# Patient Record
Sex: Female | Born: 1965 | Race: White | Hispanic: No | Smoking: Current every day smoker
Health system: Southern US, Community
[De-identification: ages and names within clinical notes are randomized; demographics above are authoritative.]

## PROBLEM LIST (undated history)

## (undated) DIAGNOSIS — F1994 Other psychoactive substance use, unspecified with psychoactive substance-induced mood disorder: Secondary | ICD-10-CM

## (undated) DIAGNOSIS — F4481 Dissociative identity disorder: Secondary | ICD-10-CM

## (undated) DIAGNOSIS — R55 Syncope and collapse: Secondary | ICD-10-CM

## (undated) DIAGNOSIS — J449 Chronic obstructive pulmonary disease, unspecified: Secondary | ICD-10-CM

## (undated) DIAGNOSIS — I5189 Other ill-defined heart diseases: Secondary | ICD-10-CM

## (undated) DIAGNOSIS — F209 Schizophrenia, unspecified: Secondary | ICD-10-CM

## (undated) DIAGNOSIS — C519 Malignant neoplasm of vulva, unspecified: Secondary | ICD-10-CM

## (undated) DIAGNOSIS — I4510 Unspecified right bundle-branch block: Secondary | ICD-10-CM

## (undated) DIAGNOSIS — F319 Bipolar disorder, unspecified: Secondary | ICD-10-CM

## (undated) DIAGNOSIS — I10 Essential (primary) hypertension: Secondary | ICD-10-CM

## (undated) DIAGNOSIS — D234 Other benign neoplasm of skin of scalp and neck: Secondary | ICD-10-CM

## (undated) DIAGNOSIS — K219 Gastro-esophageal reflux disease without esophagitis: Secondary | ICD-10-CM

## (undated) DIAGNOSIS — Z789 Other specified health status: Secondary | ICD-10-CM

## (undated) DIAGNOSIS — F1910 Other psychoactive substance abuse, uncomplicated: Secondary | ICD-10-CM

## (undated) DIAGNOSIS — I700 Atherosclerosis of aorta: Secondary | ICD-10-CM

## (undated) DIAGNOSIS — K8020 Calculus of gallbladder without cholecystitis without obstruction: Secondary | ICD-10-CM

## (undated) DIAGNOSIS — F32A Depression, unspecified: Secondary | ICD-10-CM

## (undated) DIAGNOSIS — T451X5A Adverse effect of antineoplastic and immunosuppressive drugs, initial encounter: Secondary | ICD-10-CM

## (undated) DIAGNOSIS — N200 Calculus of kidney: Secondary | ICD-10-CM

## (undated) DIAGNOSIS — I739 Peripheral vascular disease, unspecified: Secondary | ICD-10-CM

## (undated) DIAGNOSIS — R112 Nausea with vomiting, unspecified: Secondary | ICD-10-CM

## (undated) DIAGNOSIS — R0789 Other chest pain: Secondary | ICD-10-CM

## (undated) DIAGNOSIS — F419 Anxiety disorder, unspecified: Secondary | ICD-10-CM

## (undated) DIAGNOSIS — I2510 Atherosclerotic heart disease of native coronary artery without angina pectoris: Secondary | ICD-10-CM

## (undated) DIAGNOSIS — E785 Hyperlipidemia, unspecified: Secondary | ICD-10-CM

## (undated) DIAGNOSIS — R011 Cardiac murmur, unspecified: Secondary | ICD-10-CM

## (undated) DIAGNOSIS — Z5900 Homelessness unspecified: Secondary | ICD-10-CM

## (undated) DIAGNOSIS — I219 Acute myocardial infarction, unspecified: Secondary | ICD-10-CM

## (undated) DIAGNOSIS — Z7969 Long term (current) use of other immunomodulators and immunosuppressants: Secondary | ICD-10-CM

## (undated) DIAGNOSIS — B182 Chronic viral hepatitis C: Secondary | ICD-10-CM

## (undated) DIAGNOSIS — Z79899 Other long term (current) drug therapy: Secondary | ICD-10-CM

## (undated) HISTORY — DX: Gastro-esophageal reflux disease without esophagitis: K21.9

## (undated) HISTORY — PX: SKIN GRAFT: SHX250

## (undated) HISTORY — PX: OTHER SURGICAL HISTORY: SHX169

## (undated) HISTORY — DX: Anxiety disorder, unspecified: F41.9

## (undated) HISTORY — DX: Essential (primary) hypertension: I10

## (undated) HISTORY — DX: Malignant neoplasm of vulva, unspecified: C51.9

## (undated) HISTORY — DX: Chronic obstructive pulmonary disease, unspecified: J44.9

## (undated) HISTORY — DX: Chronic viral hepatitis C: B18.2

## (undated) HISTORY — DX: Acute myocardial infarction, unspecified: I21.9

---

## 1997-09-18 ENCOUNTER — Encounter: Admission: RE | Admit: 1997-09-18 | Discharge: 1997-09-18 | Payer: Self-pay | Admitting: Family Medicine

## 1997-11-27 ENCOUNTER — Encounter: Admission: RE | Admit: 1997-11-27 | Discharge: 1997-11-27 | Payer: Self-pay | Admitting: Family Medicine

## 1997-12-25 ENCOUNTER — Encounter: Admission: RE | Admit: 1997-12-25 | Discharge: 1997-12-25 | Payer: Self-pay | Admitting: Family Medicine

## 1997-12-27 ENCOUNTER — Encounter: Admission: RE | Admit: 1997-12-27 | Discharge: 1997-12-27 | Payer: Self-pay | Admitting: Family Medicine

## 1998-01-09 ENCOUNTER — Encounter: Admission: RE | Admit: 1998-01-09 | Discharge: 1998-01-09 | Payer: Self-pay | Admitting: Family Medicine

## 1998-01-17 ENCOUNTER — Encounter: Admission: RE | Admit: 1998-01-17 | Discharge: 1998-01-17 | Payer: Self-pay | Admitting: Family Medicine

## 1998-01-22 ENCOUNTER — Ambulatory Visit (HOSPITAL_COMMUNITY): Admission: RE | Admit: 1998-01-22 | Discharge: 1998-01-22 | Payer: Self-pay | Admitting: Family Medicine

## 1998-01-28 ENCOUNTER — Encounter: Payer: Self-pay | Admitting: Family Medicine

## 1998-01-28 ENCOUNTER — Ambulatory Visit (HOSPITAL_COMMUNITY): Admission: RE | Admit: 1998-01-28 | Discharge: 1998-01-28 | Payer: Self-pay | Admitting: Family Medicine

## 1998-01-30 ENCOUNTER — Encounter: Admission: RE | Admit: 1998-01-30 | Discharge: 1998-01-30 | Payer: Self-pay | Admitting: Family Medicine

## 1998-06-03 ENCOUNTER — Encounter: Admission: RE | Admit: 1998-06-03 | Discharge: 1998-06-03 | Payer: Self-pay | Admitting: Family Medicine

## 1998-06-03 ENCOUNTER — Ambulatory Visit (HOSPITAL_COMMUNITY): Admission: RE | Admit: 1998-06-03 | Discharge: 1998-06-03 | Payer: Self-pay | Admitting: Family Medicine

## 1998-06-24 ENCOUNTER — Encounter: Admission: RE | Admit: 1998-06-24 | Discharge: 1998-06-24 | Payer: Self-pay | Admitting: Family Medicine

## 1998-09-05 ENCOUNTER — Encounter: Admission: RE | Admit: 1998-09-05 | Discharge: 1998-09-05 | Payer: Self-pay | Admitting: Family Medicine

## 1998-09-05 ENCOUNTER — Encounter: Payer: Self-pay | Admitting: Family Medicine

## 1998-09-05 ENCOUNTER — Ambulatory Visit (HOSPITAL_COMMUNITY): Admission: RE | Admit: 1998-09-05 | Discharge: 1998-09-05 | Payer: Self-pay | Admitting: Family Medicine

## 1998-11-25 ENCOUNTER — Encounter: Admission: RE | Admit: 1998-11-25 | Discharge: 1998-11-25 | Payer: Self-pay | Admitting: Family Medicine

## 1998-12-30 ENCOUNTER — Encounter: Admission: RE | Admit: 1998-12-30 | Discharge: 1998-12-30 | Payer: Self-pay | Admitting: Family Medicine

## 1999-01-09 ENCOUNTER — Encounter: Admission: RE | Admit: 1999-01-09 | Discharge: 1999-01-09 | Payer: Self-pay | Admitting: Family Medicine

## 1999-01-30 ENCOUNTER — Encounter: Admission: RE | Admit: 1999-01-30 | Discharge: 1999-01-30 | Payer: Self-pay | Admitting: Sports Medicine

## 1999-03-18 ENCOUNTER — Encounter: Admission: RE | Admit: 1999-03-18 | Discharge: 1999-03-18 | Payer: Self-pay | Admitting: Family Medicine

## 1999-04-08 ENCOUNTER — Encounter: Admission: RE | Admit: 1999-04-08 | Discharge: 1999-04-08 | Payer: Self-pay | Admitting: Family Medicine

## 1999-12-24 ENCOUNTER — Encounter: Admission: RE | Admit: 1999-12-24 | Discharge: 1999-12-24 | Payer: Self-pay | Admitting: Family Medicine

## 2000-02-16 ENCOUNTER — Encounter: Admission: RE | Admit: 2000-02-16 | Discharge: 2000-02-16 | Payer: Self-pay | Admitting: Sports Medicine

## 2000-11-03 ENCOUNTER — Encounter (INDEPENDENT_AMBULATORY_CARE_PROVIDER_SITE_OTHER): Payer: Self-pay | Admitting: *Deleted

## 2000-11-03 LAB — CONVERTED CEMR LAB

## 2000-12-02 ENCOUNTER — Encounter: Admission: RE | Admit: 2000-12-02 | Discharge: 2000-12-02 | Payer: Self-pay | Admitting: Family Medicine

## 2000-12-02 ENCOUNTER — Encounter: Admission: RE | Admit: 2000-12-02 | Discharge: 2000-12-02 | Payer: Self-pay | Admitting: Sports Medicine

## 2000-12-06 ENCOUNTER — Ambulatory Visit (HOSPITAL_COMMUNITY): Admission: RE | Admit: 2000-12-06 | Discharge: 2000-12-06 | Payer: Self-pay | Admitting: *Deleted

## 2000-12-09 ENCOUNTER — Encounter: Admission: RE | Admit: 2000-12-09 | Discharge: 2000-12-09 | Payer: Self-pay | Admitting: Family Medicine

## 2000-12-15 ENCOUNTER — Encounter: Admission: RE | Admit: 2000-12-15 | Discharge: 2000-12-15 | Payer: Self-pay | Admitting: Family Medicine

## 2001-01-24 ENCOUNTER — Encounter: Admission: RE | Admit: 2001-01-24 | Discharge: 2001-01-24 | Payer: Self-pay | Admitting: Family Medicine

## 2001-02-28 ENCOUNTER — Encounter: Payer: Self-pay | Admitting: *Deleted

## 2001-02-28 ENCOUNTER — Inpatient Hospital Stay (HOSPITAL_COMMUNITY): Admission: AD | Admit: 2001-02-28 | Discharge: 2001-02-28 | Payer: Self-pay | Admitting: *Deleted

## 2001-02-28 ENCOUNTER — Encounter: Admission: RE | Admit: 2001-02-28 | Discharge: 2001-02-28 | Payer: Self-pay | Admitting: Family Medicine

## 2001-03-01 ENCOUNTER — Inpatient Hospital Stay (HOSPITAL_COMMUNITY): Admission: AD | Admit: 2001-03-01 | Discharge: 2001-03-09 | Payer: Self-pay | Admitting: Obstetrics

## 2001-03-01 ENCOUNTER — Encounter (INDEPENDENT_AMBULATORY_CARE_PROVIDER_SITE_OTHER): Payer: Self-pay | Admitting: Specialist

## 2001-03-03 ENCOUNTER — Encounter: Payer: Self-pay | Admitting: Obstetrics & Gynecology

## 2001-03-20 ENCOUNTER — Encounter: Admission: RE | Admit: 2001-03-20 | Discharge: 2001-03-20 | Payer: Self-pay | Admitting: Family Medicine

## 2001-05-03 ENCOUNTER — Encounter: Admission: RE | Admit: 2001-05-03 | Discharge: 2001-05-03 | Payer: Self-pay | Admitting: Family Medicine

## 2001-10-05 ENCOUNTER — Encounter: Admission: RE | Admit: 2001-10-05 | Discharge: 2001-10-05 | Payer: Self-pay | Admitting: Family Medicine

## 2002-06-04 ENCOUNTER — Encounter: Admission: RE | Admit: 2002-06-04 | Discharge: 2002-06-04 | Payer: Self-pay | Admitting: Family Medicine

## 2003-03-14 ENCOUNTER — Encounter: Admission: RE | Admit: 2003-03-14 | Discharge: 2003-03-14 | Payer: Self-pay | Admitting: Family Medicine

## 2003-11-01 ENCOUNTER — Encounter: Admission: RE | Admit: 2003-11-01 | Discharge: 2003-11-01 | Payer: Self-pay | Admitting: Family Medicine

## 2003-11-19 ENCOUNTER — Encounter: Admission: RE | Admit: 2003-11-19 | Discharge: 2003-11-19 | Payer: Self-pay | Admitting: Family Medicine

## 2004-08-05 ENCOUNTER — Ambulatory Visit: Payer: Self-pay | Admitting: Family Medicine

## 2005-01-16 ENCOUNTER — Emergency Department: Payer: Self-pay | Admitting: General Practice

## 2005-06-29 ENCOUNTER — Other Ambulatory Visit: Payer: Self-pay

## 2005-06-29 ENCOUNTER — Emergency Department: Payer: Self-pay | Admitting: Emergency Medicine

## 2005-07-19 ENCOUNTER — Ambulatory Visit: Payer: Self-pay | Admitting: Family Medicine

## 2005-07-27 ENCOUNTER — Emergency Department: Payer: Self-pay | Admitting: Emergency Medicine

## 2006-06-02 DIAGNOSIS — J4520 Mild intermittent asthma, uncomplicated: Secondary | ICD-10-CM | POA: Insufficient documentation

## 2006-06-02 DIAGNOSIS — F603 Borderline personality disorder: Secondary | ICD-10-CM | POA: Insufficient documentation

## 2006-06-02 DIAGNOSIS — K219 Gastro-esophageal reflux disease without esophagitis: Secondary | ICD-10-CM | POA: Insufficient documentation

## 2006-06-02 DIAGNOSIS — F17200 Nicotine dependence, unspecified, uncomplicated: Secondary | ICD-10-CM | POA: Insufficient documentation

## 2006-06-02 DIAGNOSIS — I10 Essential (primary) hypertension: Secondary | ICD-10-CM | POA: Insufficient documentation

## 2006-06-03 ENCOUNTER — Encounter (INDEPENDENT_AMBULATORY_CARE_PROVIDER_SITE_OTHER): Payer: Self-pay | Admitting: *Deleted

## 2006-11-30 ENCOUNTER — Ambulatory Visit: Payer: Self-pay | Admitting: Family Medicine

## 2006-11-30 ENCOUNTER — Ambulatory Visit: Payer: Self-pay | Admitting: Internal Medicine

## 2006-11-30 ENCOUNTER — Observation Stay (HOSPITAL_COMMUNITY): Admission: EM | Admit: 2006-11-30 | Discharge: 2006-12-01 | Payer: Self-pay | Admitting: Emergency Medicine

## 2006-11-30 ENCOUNTER — Encounter: Payer: Self-pay | Admitting: Family Medicine

## 2006-11-30 DIAGNOSIS — K279 Peptic ulcer, site unspecified, unspecified as acute or chronic, without hemorrhage or perforation: Secondary | ICD-10-CM | POA: Insufficient documentation

## 2006-11-30 DIAGNOSIS — I2510 Atherosclerotic heart disease of native coronary artery without angina pectoris: Secondary | ICD-10-CM | POA: Insufficient documentation

## 2006-11-30 DIAGNOSIS — I10 Essential (primary) hypertension: Secondary | ICD-10-CM | POA: Insufficient documentation

## 2006-11-30 DIAGNOSIS — I25118 Atherosclerotic heart disease of native coronary artery with other forms of angina pectoris: Secondary | ICD-10-CM | POA: Insufficient documentation

## 2006-11-30 DIAGNOSIS — B1710 Acute hepatitis C without hepatic coma: Secondary | ICD-10-CM

## 2006-11-30 DIAGNOSIS — F1910 Other psychoactive substance abuse, uncomplicated: Secondary | ICD-10-CM

## 2006-11-30 DIAGNOSIS — R079 Chest pain, unspecified: Secondary | ICD-10-CM

## 2006-12-01 ENCOUNTER — Encounter (INDEPENDENT_AMBULATORY_CARE_PROVIDER_SITE_OTHER): Payer: Self-pay | Admitting: Family Medicine

## 2006-12-09 ENCOUNTER — Ambulatory Visit: Payer: Self-pay | Admitting: Internal Medicine

## 2006-12-09 HISTORY — PX: LEFT HEART CATH AND CORONARY ANGIOGRAPHY: CATH118249

## 2006-12-21 ENCOUNTER — Encounter (INDEPENDENT_AMBULATORY_CARE_PROVIDER_SITE_OTHER): Payer: Self-pay | Admitting: Family Medicine

## 2007-03-20 ENCOUNTER — Inpatient Hospital Stay: Payer: Self-pay | Admitting: Unknown Physician Specialty

## 2007-03-21 ENCOUNTER — Encounter: Payer: Self-pay | Admitting: *Deleted

## 2007-10-24 ENCOUNTER — Emergency Department: Payer: Self-pay | Admitting: Emergency Medicine

## 2007-12-25 ENCOUNTER — Telehealth: Payer: Self-pay | Admitting: *Deleted

## 2008-11-25 ENCOUNTER — Emergency Department: Payer: Self-pay | Admitting: Unknown Physician Specialty

## 2009-08-21 ENCOUNTER — Inpatient Hospital Stay: Payer: Self-pay | Admitting: Internal Medicine

## 2010-01-14 ENCOUNTER — Encounter: Payer: Self-pay | Admitting: Family Medicine

## 2010-03-26 ENCOUNTER — Encounter: Payer: Self-pay | Admitting: Family Medicine

## 2010-05-07 NOTE — Miscellaneous (Signed)
  Clinical Lists Changes  Problems: Removed problem of PEPTIC ULCER DIS., UNSPEC. W/O OBSTRUCTION (ICD-533.90) Removed problem of GERD (ICD-530.81) Removed problem of ASTHMA, UNSPECIFIED (ICD-493.90)

## 2010-05-07 NOTE — Miscellaneous (Signed)
  Clinical Lists Changes  Problems: Removed problem of ASTHMA (ICD-493.90)

## 2010-07-31 ENCOUNTER — Emergency Department: Payer: Self-pay | Admitting: Internal Medicine

## 2010-08-21 NOTE — Discharge Summary (Signed)
[facility]  Patient:    Carol Henderson, Carol Henderson Visit Number: [id number] MRN: [id number]          Service Type: OBS Location: 910A 9139 01 Attending Physician:  Carol Henderson Dictated by:   Carol Henderson, M.D. Admit Date:  03/01/2001 Discharge Date: 03/09/2001   CC:         Carol Henderson, M.D.             Carol Henderson, M.D.                           Discharge Summary  DISCHARGE DIAGNOSES:          1. Prolonged premature rupture of membranes.                               2. Repeat low transverse cesarean section.                               3. Bilateral tubal ligation.                               4. Anemia.                               5. Polysubstance abuse.                               6. Psychiatric disorder, not otherwise                                  specified.  PROCEDURES:                   1. Repeat low transverse cesarean section on                                  March 06, 2001, by Dr. Corky Henderson.                               2. Bilateral tubal ligation on March 06, 2001,                                  also by Dr. Gavin Henderson.                               3. Limited obstetric ultrasound on                                  March 03, 2001, which showed single living                                  intrauterine gestation in cephalic  presentation, subjectively decreased amniotic                                  fluid with an amniotic fluid index of 7.2 cm,                                  shortened cervix measuring 2.0 cm.  LABORATORY DATA:              CBC on March 08, 2001, showed a white blood cell count of 13.2, hemoglobin 8.7, hematocrit of 24.6, platelet count of 378. CMET on March 08, 2001, showed a sodium of 137, potassium 4.2, chloride of 110, CO2 of 25, glucose of 88, BUN of 8, creatinine of 0.6, bilirubin of 0.2, alkaline phosphatase of 99, SGOT 13, SGPT of  15, total protein of 5.7, albumin of 2.3, calcium of 8.3, uric acid of 3.3, LDH of 158.  Urine drug screen on March 01, 2001, showed positive benzodiazepines and cocaine and THC.  RPR was nonreactive.  L to S ratio was pending at discharge.  HOSPITAL COURSE:              Carol Henderson is a 45 year old G2, now P1-1-0-2, who delivered a little baby girl with Apgars of 9 and 9 at one and five minutes by repeat low transverse cesarean section on March 06, 2001, as above.  The patient also underwent bilateral tubal ligation.  The patient tolerated the procedure well without complications.  The patient was watched postoperatively for three days without complications.  She has been afebrile with normal and stable vital signs.  She had [facility] laboratories that were normal.  On the third day postoperatively, the patient was doing well, was ambulating in the halls, tolerating p.o. well, and was passing flatus.  The patients incision looked clean, dry, and intact.  Staples were removed and replaced with Steri-Strips.  The patient was then discharged to home with baby to follow up in the [facility] in six weeks.  The baby is to follow up at the same place in two weeks.  ACTIVITY INSTRUCTIONS:        No heavy lifting for one month.  DIET:                         No restrictions.  MEDICATIONS:                  1. Ferrous sulfate 325 mg p.o. t.i.d.                               2. Prenatal vitamins 1 tablet q.d.                               3. Percocet 5/325 one to two tablets p.o.                                  q.6h. p.r.n. for pain.  SPECIAL INSTRUCTIONS:         The patient was told to watch her incision for warmth, bleeding, discharge, or increased tenderness.  She is to follow up with Dr. Efraim Kaufmann  Susann Henderson at the [facility] in six weeks.  She is to call [phone] for an appointment. Dictated by:   Carol Henderson, M.D. Attending Physician:  Carol Henderson DD:  03/09/01 TD:  03/09/01 Job: [id number]

## 2010-08-21 NOTE — Discharge Summary (Signed)
Carol Henderson, Carol Henderson                ACCOUNT NO.:  [id number]   MEDICAL RECORD NO.:  [id number]          PATIENT TYPE:  OUT   LOCATION:  EKG                          FACILITY:  [facility]   PHYSICIAN:  Leighton Roach McDiarmid, M.D.DATE OF BIRTH:  February 21, 1966   DATE OF ADMISSION:  11/30/2006  DATE OF DISCHARGE:  12/01/2006                               DISCHARGE SUMMARY   PRIMARY CARE PHYSICIAN:  Levander Campion, M.D.   CONSULTATIONSEduardo Osier. Sharyn Lull, M.D., cardiology.   REASON FOR ADMISSION:  Chest pain.   LABORATORY DATA:  On admission white blood cells 8.0, hemoglobin 15.5,  platelets 351, creatinine 0.91, liver enzymes were completely normal.  A  UDS was positive for cocaine and THC.  Admission studies and EKG showed  nonspecific T-wave changes.   DISCHARGE DIAGNOSES:  1. Chest pain.  2. Hypertension.  3. Chronic obstructive pulmonary disease.  4. Gastroesophageal reflux disease.   SECONDARY DISCHARGE DIAGNOSES:  1. Hepatitis C, stable.  2. Substance abuse.  3. Tobacco abuse.   DISCHARGE MEDICATIONS:  New:  1. Norvasc 10 mg daily.  2. Omeprazole 40 mg daily.  3. Spiriva 1 puff daily.  4. Nitro 0.4 mg sublingually every 5 minutes, max 3 doses in 15      minutes.   HOSPITAL COURSE:  Miss Yonts is a 45 year old who presented to the  [facility] for a regular appointment, however,  described history of chest pain to her physician.  The story, timing and  onset was suspicious and worrisome, therefore the patient felt it wise  to admit the patient for a chest pain workup.  1. Regarding her chest pain, she did have a couple episodes overnight,      however, her cardiac enzymes remained negative and her EKG remained      unchanged, causes could be cardiac, i.e., unstable angina, GERD or      chest pain could be secondary to cocaine.  Cardiology was consulted      as the patient's pain was atypical and she was relatively young and      had negative enzymes and an  unchanged EKG.  Dr. Sharyn Lull she be      scheduled in his office as an outpatient for further workup.  She      was discharged home with a nitro prescription and instructions of      how to use it and when and why to return to the emergency room, as      well as advisement to stop cocaine use.  2. Regarding her hypertension, the patient's pressures were      significantly elevated especially her diastolics.  We did begin      Norvasc as a beta blocker is contraindicated with cocaine use and      the patient did not a diuretic.  We did recommend that perhaps      adding lisinopril would be wise in this patient if she shows to be      compliant with follow-up appointments, compliance in the past has      not been  remarkable, therefore we did not start lisinopril at      discharge as we were not sure if patient would followup with lab      work to check her creatinine and her potassium.  If the patient      schedules an appointment with Dr. Luz Brazen a instructed and shows      good compliance, recommend started lisinopril for her blood      pressure.  3. Regarding her COPD, she was restarted on Spiriva by her primary      care physician before coming to the hospital.  We continued this at      discharge and recommend close followup with her primary care      physician.   Pertinent discharge labs:  White blood cells 8.1, hemoglobin 16.2,  platelets 326, creatinine 0.69, glucose 81, total cholesterol was 178,  triglycerides 89, HDL 44, and LDL 116.  Cardiac enzymes were negative  times 3.   CONDITION ON DISCHARGE:  Stable.   DISPOSITION:  Home.   FOLLOW-UP APPOINTMENT:  The patient was instructed to schedule a follow-  up appointment with Dr. Luz Brazen in 1 week and an appointment was made  for the patient at Dr. Annitta Jersey office for Tuesday, December 06, 2006,  at 1:45 p.m.   Follow-up issues primarily for her primary care physician would be:  1. Her blood pressure as described above.   2. The result of her cardiology appointment.  3. Any further chest pain.      Ardeen Garland, MD  Electronically Signed      Leighton Roach McDiarmid, M.D.  Electronically Signed    LM/MEDQ  D:  12/03/2006  T:  12/04/2006  Job:  [id number]   cc:   Eduardo Osier. Sharyn Lull, M.D.  Levander Campion, M.D.

## 2010-08-21 NOTE — Op Note (Signed)
[facility]  Patient:    Carol Henderson, Carol Henderson Visit Number: [id number] MRN: [id number]          Service Type: OBS Location: 910A 9139 01 Attending Physician:  Tammi Sou Dictated by:   Conni Elliot, M.D. Proc. Date: 03/06/01 Admit Date:  03/01/2001                             Operative Report  PREOPERATIVE DIAGNOSIS:       Preterm prolonged rupture of membranes in labor,                               prior cesarean delivery, request repeat, and                               desires surgical sterilization.  POSTOPERATIVE DIAGNOSIS:      Preterm prolonged rupture of membranes in labor,                               prior cesarean delivery, request repeat, and                               desires surgical sterilization.  OPERATION:                    Low transverse cesarean and modified bilateral                               [place] tubal ligation.  SURGEON:                      Conni Elliot, M.D.  ASSISTANT:                    __________.  ANESTHESIA:                   Spinal.  OPERATIVE FINDINGS:           Female with Apgars of 9 and 9.  Cord pH was sent.  Placenta to pathology and evidence of a _______ cord noted at the cesarean.  DESCRIPTION OF PROCEDURE:     The patient received spinal.  The patient was supine with lateral tilt position.  Receiving oxygen.  The abdomen was prepped and draped in the usual sterile fashion.  A low transverse Pfannenstiel incision was made in the skin and the rectus fascia.  The rectus muscles were separated in the midline, ______ and bladder flap created.  A low transverse uterine incision was made.  The baby delivered in the vertex position.  The cord doubly clamped and cut.  The baby handed to the neonatologist in attendance.  The placenta was removed manually.  The uterus was closed in a routine fashion.  However, the bladder flap was so scarred it was not reapproximated.  The  anterior peritoneum was also not reapproximated due to omental adhesions.  The rectus muscles were brought together in the midline. The fascia and skin were closed in the routine fashion.  Estimated blood loss was less than 800 cc.  I need to note that upon finishing the uterine closure, at  the patients request, the right fallopian was identified, grasped with Babcock clamps, and followed to its fimbriated end.  A segment of the tube was brought into the operative field and double suture ligated and a 1.5 cm to 2 cm segment was excised.  Hemostasis was adequate.  This was repeated on the left tube and hemostasis again adequate. Dictated by:   Conni Elliot, M.D. Attending Physician:  Tammi Sou DD:  03/06/01 TD:  03/07/01 Job: [id number]

## 2011-01-15 LAB — DIFFERENTIAL
Basophils Absolute: 0
Eosinophils Absolute: 0.2
Eosinophils Relative: 2
Lymphocytes Relative: 35
Monocytes Absolute: 0.6

## 2011-01-15 LAB — CBC
HCT: 45.4
Hemoglobin: 15.5 — ABNORMAL HIGH
Hemoglobin: 16.2 — ABNORMAL HIGH
MCHC: 34.6
MCV: 91.2
Platelets: 326
Platelets: 351
RBC: 4.98
RDW: 13.4
WBC: 8

## 2011-01-15 LAB — LIPID PANEL
Cholesterol: 178
LDL Cholesterol: 116 — ABNORMAL HIGH
Triglycerides: 89
VLDL: 18

## 2011-01-15 LAB — CARDIAC PANEL(CRET KIN+CKTOT+MB+TROPI)
CK, MB: 1.6
CK, MB: 4.1 — ABNORMAL HIGH
Relative Index: INVALID
Relative Index: INVALID
Total CK: 37
Total CK: 51
Troponin I: 0.03

## 2011-01-15 LAB — COMPREHENSIVE METABOLIC PANEL
ALT: 18
AST: 21
Albumin: 3.3 — ABNORMAL LOW
CO2: 25
Chloride: 107
GFR calc Af Amer: >60
GFR calc non Af Amer: >60
Potassium: 3.9
Sodium: 138
Total Bilirubin: 0.6

## 2011-01-15 LAB — BASIC METABOLIC PANEL
Calcium: 9.5
GFR calc Af Amer: >60
GFR calc non Af Amer: >60
Potassium: 3.4 — ABNORMAL LOW
Sodium: 135

## 2011-01-15 LAB — RAPID URINE DRUG SCREEN, HOSP PERFORMED
Amphetamines: NOT DETECTED
Barbiturates: NOT DETECTED
Opiates: NOT DETECTED

## 2011-01-15 LAB — TROPONIN I: Troponin I: 0.03

## 2011-01-15 LAB — CK TOTAL AND CKMB (NOT AT ARMC): CK, MB: 1.6

## 2011-07-01 ENCOUNTER — Ambulatory Visit: Payer: Self-pay | Admitting: General Practice

## 2013-01-26 DIAGNOSIS — I214 Non-ST elevation (NSTEMI) myocardial infarction: Secondary | ICD-10-CM

## 2013-01-26 HISTORY — DX: Non-ST elevation (NSTEMI) myocardial infarction: I21.4

## 2013-01-26 LAB — COMPREHENSIVE METABOLIC PANEL
Albumin: 3.5 g/dL (ref 3.4–5.0)
Alkaline Phosphatase: 112 U/L (ref 50–136)
Anion Gap: 10 (ref 7–16)
BUN: 11 mg/dL (ref 7–18)
Calcium, Total: 9.4 mg/dL (ref 8.5–10.1)
Co2: 26 mmol/L (ref 21–32)
EGFR (Non-African Amer.): >60
Glucose: 109 mg/dL — ABNORMAL HIGH (ref 65–99)
Osmolality: 266 (ref 275–301)
Potassium: 3.9 mmol/L (ref 3.5–5.1)
SGOT(AST): 52 U/L — ABNORMAL HIGH (ref 15–37)
SGPT (ALT): 33 U/L (ref 12–78)

## 2013-01-26 LAB — CBC
HGB: 18.7 g/dL — ABNORMAL HIGH (ref 12.0–16.0)
MCHC: 34.5 g/dL (ref 32.0–36.0)
WBC: 17.3 10*3/uL — ABNORMAL HIGH (ref 3.6–11.0)

## 2013-01-26 LAB — TSH: Thyroid Stimulating Horm: 2.6 u[IU]/mL

## 2013-01-27 ENCOUNTER — Inpatient Hospital Stay: Payer: Self-pay | Admitting: Internal Medicine

## 2013-01-27 LAB — TROPONIN I
Troponin-I: 0.6 ng/mL — ABNORMAL HIGH
Troponin-I: 0.23 ng/mL — ABNORMAL HIGH

## 2013-01-27 LAB — APTT: Activated PTT: 95.3 secs — ABNORMAL HIGH (ref 23.6–35.9)

## 2013-01-27 LAB — CK TOTAL AND CKMB (NOT AT ARMC): CK, Total: 114 U/L (ref 21–215)

## 2013-01-28 LAB — LIPID PANEL
Cholesterol: 162 mg/dL (ref 0–200)
HDL Cholesterol: 35 mg/dL — ABNORMAL LOW (ref 40–60)
Triglycerides: 130 mg/dL (ref 0–200)

## 2013-01-28 LAB — HEMOGLOBIN: HGB: 15.8 g/dL (ref 12.0–16.0)

## 2013-01-28 LAB — URINALYSIS, COMPLETE
Blood: NEGATIVE
Glucose,UR: NEGATIVE mg/dL (ref 0–75)
Leukocyte Esterase: NEGATIVE
Ph: 8 (ref 4.5–8.0)
Squamous Epithelial: 1
WBC UR: 5 /HPF (ref 0–5)

## 2013-01-28 LAB — APTT: Activated PTT: 61.2 secs — ABNORMAL HIGH (ref 23.6–35.9)

## 2013-01-28 LAB — PLATELET COUNT: Platelet: 254 10*3/uL (ref 150–440)

## 2013-01-29 HISTORY — PX: LEFT HEART CATH AND CORONARY ANGIOGRAPHY: CATH118249

## 2013-05-10 ENCOUNTER — Ambulatory Visit: Payer: Self-pay | Admitting: Gynecologic Oncology

## 2013-05-15 ENCOUNTER — Ambulatory Visit: Payer: Self-pay | Admitting: Gynecologic Oncology

## 2013-05-27 DIAGNOSIS — Z8544 Personal history of malignant neoplasm of other female genital organs: Secondary | ICD-10-CM | POA: Insufficient documentation

## 2013-05-27 DIAGNOSIS — C519 Malignant neoplasm of vulva, unspecified: Secondary | ICD-10-CM | POA: Insufficient documentation

## 2013-06-03 ENCOUNTER — Ambulatory Visit: Payer: Self-pay | Admitting: Gynecologic Oncology

## 2013-06-19 DIAGNOSIS — T451X5A Adverse effect of antineoplastic and immunosuppressive drugs, initial encounter: Secondary | ICD-10-CM

## 2013-06-19 DIAGNOSIS — R112 Nausea with vomiting, unspecified: Secondary | ICD-10-CM | POA: Insufficient documentation

## 2013-07-03 DIAGNOSIS — G893 Neoplasm related pain (acute) (chronic): Secondary | ICD-10-CM | POA: Insufficient documentation

## 2013-07-09 DIAGNOSIS — E876 Hypokalemia: Secondary | ICD-10-CM | POA: Insufficient documentation

## 2013-08-18 DIAGNOSIS — R55 Syncope and collapse: Secondary | ICD-10-CM | POA: Insufficient documentation

## 2013-08-18 DIAGNOSIS — R031 Nonspecific low blood-pressure reading: Secondary | ICD-10-CM | POA: Insufficient documentation

## 2013-08-18 DIAGNOSIS — I959 Hypotension, unspecified: Secondary | ICD-10-CM | POA: Insufficient documentation

## 2014-04-05 DIAGNOSIS — I219 Acute myocardial infarction, unspecified: Secondary | ICD-10-CM

## 2014-04-05 DIAGNOSIS — B182 Chronic viral hepatitis C: Secondary | ICD-10-CM

## 2014-04-05 HISTORY — DX: Acute myocardial infarction, unspecified: I21.9

## 2014-04-05 HISTORY — DX: Chronic viral hepatitis C: B18.2

## 2014-04-17 ENCOUNTER — Ambulatory Visit: Payer: Self-pay | Admitting: Urgent Care

## 2014-05-01 ENCOUNTER — Ambulatory Visit: Payer: Self-pay | Admitting: Urgent Care

## 2014-05-01 LAB — FERRITIN: Ferritin (ARMC): 59 ng/mL (ref 8–388)

## 2014-05-01 LAB — CBC WITH DIFFERENTIAL/PLATELET
BASOS ABS: 0.1 10*3/uL (ref 0.0–0.1)
Basophil %: 0.9 %
Eosinophil #: 0.1 10*3/uL (ref 0.0–0.7)
Eosinophil %: 0.8 %
HCT: 44.4 % (ref 35.0–47.0)
HGB: 14.8 g/dL (ref 12.0–16.0)
LYMPHS ABS: 1.4 10*3/uL (ref 1.0–3.6)
Lymphocyte %: 20.7 %
MCH: 31.2 pg (ref 26.0–34.0)
MCHC: 33.2 g/dL (ref 32.0–36.0)
MCV: 94 fL (ref 80–100)
MONO ABS: 0.6 x10 3/mm (ref 0.2–0.9)
MONOS PCT: 8.3 %
Neutrophil #: 4.7 10*3/uL (ref 1.4–6.5)
Neutrophil %: 69.3 %
PLATELETS: 344 10*3/uL (ref 150–440)
RBC: 4.73 10*6/uL (ref 3.80–5.20)
RDW: 13.4 % (ref 11.5–14.5)
WBC: 6.8 10*3/uL (ref 3.6–11.0)

## 2014-05-01 LAB — COMPREHENSIVE METABOLIC PANEL
ALBUMIN: 3.6 g/dL (ref 3.4–5.0)
ALK PHOS: 103 U/L (ref 46–116)
ALT: 31 U/L (ref 14–63)
Anion Gap: 8 (ref 7–16)
BILIRUBIN TOTAL: 0.3 mg/dL (ref 0.2–1.0)
BUN: 19 mg/dL — AB (ref 7–18)
CREATININE: 0.96 mg/dL (ref 0.60–1.30)
Calcium, Total: 9.6 mg/dL (ref 8.5–10.1)
Chloride: 106 mmol/L (ref 98–107)
Co2: 24 mmol/L (ref 21–32)
EGFR (African American): >60
GLUCOSE: 74 mg/dL (ref 65–99)
OSMOLALITY: 277 (ref 275–301)
Potassium: 3.9 mmol/L (ref 3.5–5.1)
SGOT(AST): 28 U/L (ref 15–37)
SODIUM: 138 mmol/L (ref 136–145)
TOTAL PROTEIN: 7.9 g/dL (ref 6.4–8.2)

## 2014-05-01 LAB — IRON: IRON: 57 ug/dL (ref 50–170)

## 2014-06-20 DIAGNOSIS — F1120 Opioid dependence, uncomplicated: Secondary | ICD-10-CM | POA: Insufficient documentation

## 2014-07-26 NOTE — H&P (Signed)
PATIENT NAME:  Carol Henderson, Carol Henderson MR#:  [id number] DATE OF BIRTH:  [birth year]  DATE OF ADMISSION:  01/27/2013  REFERRING PHYSICIAN: Dr. Corky Downs.   PRIMARY CARE PHYSICIAN: Dr. Randel Books.   CHIEF COMPLAINT: Chest pain.   HISTORY OF PRESENT ILLNESS: This is a 49 year old Caucasian female with past medical history of hypertension, COPD, hepatitis C who has presented with chest pain. Her symptoms originally began 2:30 a.m. 01/26/2013 which awoke her from sleep. She described this as a retrosternal chest pain with a pressure and quality 9 out of 10 in intensity, nonradiating. No worsening or relieving factors. It lasted a few hours. She had associated shortness of breath, nausea, emesis was nonbloody, nonbilious x 1, as well as diaphoresis. She then had a repeat episode later in the day, but is uncertain about the timing after that episode. Per the insistence of her mother, she presented to [facility] for workup and evaluation. She was given aspirin and nitroglycerin with mild improvement in symptoms.  Her initial EKG had concerning ST changes. The ED physician discussed the case with [facility], concerning this may require transfer to a higher care facility, however, on repeat EKG, ST changes improved. Case was discussed and deemed to be an N-STEMI and patient treated appropriately here.  Prior to this she has not had any anginal, exertional prior or primary chest pain.   REVIEW OF SYSTEMS: CONSTITUTIONAL: Denies fevers, chills, fatigue.  EYES: Denies blurred vision, double vision, eye pain.  ENT: Denies ear pain, hearing loss or discharge.  RESPIRATORY: Denies cough, wheeze. Positive for shortness of breath as above.  CARDIOVASCULAR: Chest pain as above. Denies orthopnea, edema, palpitations.  GASTROINTESTINAL: Nausea, vomiting as above. Denies diarrhea, abdominal pain, hematemesis.  GENITOURINARY: Denies hematuria, dysuria.  ENDOCRINE: Denies nocturia or polyuria.  HEMATOLOGIC AND LYMPHATIC:  Denies easy bruising or bleeding.  SKIN: Denies any rash or lesions.   MUSCULOSKELETAL: Denies pain in her neck, back, shoulder, knees, hip, any arthritic symptoms.  NEUROLOGIC: Denies paralysis, paresthesias.  PSYCHIATRIC: Denies any anxiety or depressive symptoms.   Otherwise, full review of systems performed by me is negative.   PAST MEDICAL HISTORY: Hypertension, hepatitis C,  COPD.   FAMILY HISTORY: Positive for lung cancer, as well as coronary artery disease in her father at late onset, breast cancer in her mother.   SOCIAL HISTORY: Positive for tobacco abuse. Currently half pack daily smoker. Denies any alcohol or drug usage.   ALLERGIES: No known drug allergies.   HOME MEDICATIONS: Albuterol 90 mcg inhalation 2 puffs q.4 hours as needed for shortness of breath, amitriptyline 50 mg p.o. at bedtime, benztropine 1 mg p.o. at bedtime, Catapres 0.2 mg p.o. b.i.d., hydrochlorothiazide 25 mg daily, prazosin 2 mg p.o. at bedtime, trazodone 400 mg p.o. at bedtime.   PHYSICAL EXAMINATION: VITAL SIGNS: Temperature 98.2, heart rate 113, respirations 20, blood pressure 106/74, saturating 94% on room air. Weight 93.4 kg, BMI 35.4.  GENERAL: Well-nourished, well-developed, Caucasian female who is currently in no acute distress.  HEAD: Normocephalic, atraumatic.  EYES: Pupils equal, round, react to light as well as accommodation. Extraocular muscles intact. No scleral icterus.  MOUTH: Moist mucous membranes, absent dentition. No abscesses noted.  EARS, NOSE, THROAT: Throat clear without exudate. No external lesions.  NECK: Supple. No thyromegaly. No nodules. No JVD.  PULMONARY: Clear bilaterally, no history of rales or wheezes, rhonchi. No use of accessory muscles. Good respiratory effort.  CHEST: Mild tender to palpation over her sternum, though symptoms were severe than prior in  the day.   CARDIOVASCULAR: S1, S2, regular rate and rhythm. No murmurs, rubs or gallops. No edema. Pedal pulse 2+.   GASTROINTESTINAL: Soft, nontender, nondistended. No masses. No hepatosplenomegaly. Positive bowel sounds.  MUSCULOSKELETAL: No swelling, clubbing or edema. Range of motion full in all extremities.  NEUROLOGIC: Cranial nerves II through XII intact. No gross motor deficits. Sensation intact. Reflexes intact.  SKIN: No ulcerations, lesions, rashes or cyanosis. She does have multiple tattoos. Skin is warm and dry. Turgor is intact. PSYCHIATRIC: Mood and affect within normal limits. She is awake, oriented x 3. Insight and judgment intact.   LABORATORY DATA: Sodium 133, potassium 3.9, chloride 97, bicarb 26, BUN 11, creatinine 1.07, glucose 109, troponin I 0.81. TSH 2.6. WBC 17.3, hemoglobin 18.7, platelets of 346. EKG performed at 2136 reveals repolarization abnormality, V3, V4, V5 sinus tachycardia, heart rate of 130. Repeat EKG has remained sinus tachycardic, heart rate now 119 with T inversions V3 to V6.   ASSESSMENT AND PLAN: A 49 year old female with history of hypertension, chronic obstructive pulmonary disease, presenting with chest pain, found to have EKG changes and positive enzymes one and STEMI score of 2. 1. Trend cardiac enzymes. already given aspirin and nitroglycerin and heparin drip started. Consult cardiology. Start beta blocker and statin therapy.  2. Hypertension. Start beta blocker, hold Catapres and Prazosin for more traditional blood pressure agents. 3. Chronic obstructive pulmonary disease, no active symptoms or wheeze. She is on p.r.n. medications. 4.   Deep vein thrombosis prophylaxis. She will be on a heparin drip.  5.  The patient is full code.   TIME SPENT: 45 minutes.     ____________________________ Aaron Mose. Caedan Sumler, MD dkh:NTS D: 01/27/2013 01:05:09 ET T: 01/27/2013 02:06:34 ET JOB#: [id number]  cc: Aaron Mose. Caryn Gienger, MD, <Dictator> Santiago Stenzel Woodfin Ganja MD ELECTRONICALLY SIGNED 01/27/2013 4:04

## 2014-07-26 NOTE — Discharge Summary (Signed)
PATIENT NAME:  Carol Henderson, Carol Henderson MR#:  [id number] DATE OF BIRTH:  November 07, 1965  DATE OF ADMISSION:  01/27/2013 DATE OF DISCHARGE:  01/29/2013  ADMITTING PHYSICIAN:  Dr.  Valentino Nose   DISCHARGING PHYSICIAN: Dr. Gladstone Lighter   PRIMARY CARE PHYSICIAN:  Dr. Randel Books.   [place]: Cardiology consultation with Dr. Nehemiah Massed.   DISCHARGE DIAGNOSES: 1. Non-ST segment elevation myocardial infarction in the cardiac catheterization revealing multi small disease not amenable for any PCI and no critical stenosis noted.  2.  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  3.  Chronic systolic congestive heart failure, well compensated.  4.  Tobacco use disorder.  5.  Gastroesophageal reflux disease.  6.  Chronic obstructive pulmonary disease.  7.  Persistent chest pain, likely noncardiac and appears to be comfortable.   DISCHARGE MEDICATIONS: 1.  Albuterol inhaler, 2 puffs every 4 hours p.r.n.  2.  Trazodone 400 mg p.o. at bedtime.  3.  Benztropine 1 mg p.o. at bedtime.  4.  Lisinopril 2.5 mg p.o. daily.  5.  Amitriptyline 50 mg p.o. daily.  6.  Protonix 40 mg p.o. b.i.d.  7.  Spiriva inhaler daily.  8.  Advair 250/50, one puff b.i.d.   9.  Metoprolol tartrate 12.5 mg  p.o. b.i.d.  10.  Lipitor 40 mg p.o. daily.   DISCHARGE DIET:  Low sodium diet.    DISCHARGE ACTIVITY: As tolerated.    FOLLOWUP INSTRUCTIONS: 1.  PCP follow-up in 2 weeks.  2.  Follow up with Dr. Nehemiah Massed in one week.  3.  Quit smoking.     LABORATORY AND IMAGING STUDIES PRIOR TO DISCHARGE:   LDL cholesterol 101, HDL 35, total cholesterol 162, triglycerides 130,  VLDL 26.   Urinalysis negative for any infection. Echocardiogram showing LV ejection fraction is 30% to 35%, decreased global LV systolic function, entire apices of normal and dilated left atrium is present.   Troponins were elevated, but trended down from 0.6 to 0.2.   Sodium 133, potassium 3.9,  chloride 97, bicarbonate 26, BUN 11, creatinine 1.07, glucose 109 and calcium of 9.4.   ALT 33, AST 52, alkaline phosphatase 112, total bilirubin 0.7 and albumin of 3.5.   WBC 17.3 on admission, hemoglobin 18.7, hematocrit 54.2, and platelets count 346, partly from  hemoconcentration after evaluation, hemoglobin came down to 15.8.   Chest x-ray on admission showing clear lung fields. No evidence of cardiopulmonary disease.   BRIEF HOSPITAL COURSE:  Ms. Carol Henderson is a 49 year old female with a past medical history significant for hepatitis C, COPD, hypertension and significant family history of cardiac disease presented secondary to sudden onset of retrosternal chest pain.  1.  Chest pain, likely non- ST segment elevation myocardial infarction, however, there is also a component of noncardiac chest pain. The patient was started on IV heparin and changed over to Lovenox and had a cardiac catheterization done by Dr. Nehemiah Massed, which revealed noncritical multivessel disease, especially in the small branch, not amenable for PCI. There is a 60% proximal LAD stenosis, which should not be causing her significant chest pain. The patient appears comfortable, ambulates, but she requires morphine every two hours. When the discharge plan was discussed also patient did not see anything about her chest pain but was asking for her morphine every two hours. She did have abnormal apex, so  Dr. Nehemiah Massed is not completely sure if this is apical ballooning syndrome that is stressing his cardiomyopathy versus ischemic cardiomyopathy from the small  blockages. She will need a follow-up echocardiogram and cardiology follow-up. She was strongly advised to quit her smoking and is being discharged on aspirin, statin, metoprolol and lisinopril. Her blood pressure has been in the low normal range. Home medications were stopped and she is being discharged on low-dose lisinopril and metoprolol.  2.  Hyperlipidemia. She is on statin at  this time.  3.  Chronic obstructive pulmonary disease, appears stable. She can continue her inhalers.  4.  Tobacco use disorder. She was strongly counseled again at the time of discharge about quitting smoking.  5.  Her course has been otherwise uneventful in the hospital.   DISCHARGE CONDITION: Stable.   DISCHARGE DISPOSITION: Home.   TIME SPENT ON DISCHARGE: 45 minutes.   ____________________________ Gladstone Lighter, MD rk:cc D: 01/29/2013 15:07:16 ET T: 01/30/2013 02:17:11 ET JOB#: [id number]  cc: Gladstone Lighter, MD, <Dictator> Corey Skains, MD Tivis Ringer. Randel Books, MD  Gladstone Lighter MD ELECTRONICALLY SIGNED 02/13/2013 13:54

## 2014-07-26 NOTE — Discharge Summary (Signed)
PATIENT NAME:  Carol Henderson, Carol Henderson MR#:  [id number] DATE OF BIRTH:  October 03, 1965  ADDENDUM TO DISCHARGE SUMMARY ON 01/29/2013   ADMITTING PHYSICIAN: Valentino Nose, MD  DISCHARGING PHYSICIAN: Gladstone Lighter, MD  [place]: Sarina Ser, MD  PRIMARY CARE PHYSICIAN: Dr. Jacqualine Code.   For whole discharge summary, please look at the discharge summary. This is an addendum to the discharge summary done by Dr. Tressia Miners on January 29, 2013. The patient was admitted for non-STEMI and chest pain. She had cardiac catheterization done and showed only small vessel disease which were not amenable for any PCI, so she was recommended to be on medical management.   She was supposed to be discharged on January 29, 2013; however, the patient complained of continued chest pain though she appeared very comfortable, no dyspnea, no discomfort on ambulation. Not sure if she was just requesting morphine or she really had chest pain, so Dr. Nehemiah Massed was contacted, and he did not think the chest pain was related to cardiac, especially cath, because all her vessels that were blocked were only small vessels and did not compromise any blood flow to the heart.   She will follow up with Dr. Nehemiah Massed in 2 weeks. The patient's chest pain was more tender to touch, so she was given a shot of Solu-Medrol and pain was better controlled with tramadol and is being discharged home.   All the medications are the same from the discharge medications dictated on 01/29/2013 other than tramadol 50 mg p.o. q.6 hours p.r.n. and ibuprofen 400 mg p.o. q.6 hours p.r.n. were added to the discharge medications.    ____________________________ Gladstone Lighter, MD rk:np D: 01/30/2013 15:33:44 ET T: 01/30/2013 18:54:49 ET JOB#: [id number]  cc: Gladstone Lighter, MD, <Dictator> Corey Skains, MD Gladstone Lighter MD ELECTRONICALLY SIGNED 02/13/2013 13:55

## 2014-07-26 NOTE — Consult Note (Signed)
PATIENT NAME:  Carol Henderson, ROELL MR#:  [id number] DATE OF BIRTH:  31-Oct-1965  DATE OF CONSULTATION:  01/27/2013  REFERRING PHYSICIAN:  Dr. Nadara Mustard. CONSULTING PHYSICIAN:  Corey Skains, MD  REASON FOR CONSULTATION:  Acute subendocardial myocardial infarction, hypertension.  CHIEF COMPLAINT:  "I have chest pain".  HISTORY OF PRESENT ILLNESS:  This is a 49 year old female with tobacco abuse, a family history of heart disease with hypertension, who has had new onsets of sternal chest discomfort with abnormal EKG and elevated troponin. The patient had awakened with this chest pain, substernal in nature, radiating into her back. This is associated with shortness of breath and weakness. The patient, therefore, was seen in the Emergency Room with an anterior T-wave and ST changes but no evidence of ST elevation myocardial infarction. She has had the appropriate medication management and still has a dull ache in her chest. Troponin level is 0.81 consistent with subendocardial myocardial infarction. Blood pressure is well controlled. The remainder of review of systems is negative for vision change, ringing in her ears, hearing loss, cough, congestion, heart burn,  nausea, vomiting, diarrhea, bloody stools, stomach pain, extremity pain, leg weakness, cramping of the buttocks, known blood clots, headaches, blackouts, dizzy spells, nose bleeds, congestion, trouble swallowing, frequent urination, urination at night, muscle weakness, numbness, anxiety, depression, skin lesions, skin rashes.  PAST MEDICAL HISTORY:  Hypertension.  FAMILY HISTORY:  Father had a myocardial infarction at an early age.  SOCIAL HISTORY:  She smokes one pack per day, denies alcohol use.    ALLERGIES:  AS LISTED.  MEDICATIONS:  As listed.  PHYSICAL EXAMINATION:  VITAL SIGNS:  Blood pressure 122/68 bilaterally, heart rate is 62 upright, reclining, and regular.  GENERAL:  She is a well-appearing female in no acute distress.  HEENT:   No icterus, thyromegaly, ulcers, hemorrhage, or xanthelasma.  CARDIOVASCULAR:  Regular rate and rhythm, normal S1 and S2 without murmur, gallop, rub. The PMI is normal size and placement. Carotid upstroke normal without bruit. Jugular venous pressure is normal. LUNGS:  Have a few bibasilar crackles with normal respirations.  ABDOMEN:  Soft, nontender, without hepatosplenomegaly or masses. Abdominal aorta is normal size without bruit.  EXTREMITIES:  Show 2+ pulses in dorsal pedal, radial and femoral arteries without lower extremity edema, cyanosis, clubbing or ulcers.  NEUROLOGIC:  She is oriented to time, place and person with normal mood and affect.   ASSESSMENT:  A 49 year old female with hypertension, abnormal EKG, chest pain, tobacco abuse with symptoms consistent with subendocardial myocardial infarction.  RECOMMENDATIONS:  1.  Continue serial ECG and enzymes to assess for myocardial infarction. 2.  Heparin, nitroglycerine, oxygen, beta-blocker as necessary for chest pain with morphine. 3.  Proceed to cardiac catheterization to assess coronary anatomy and further treatment as necessary. The patient understands the risk and benefits of cardiac catheterization. This includes the possibility of death, stroke, heart attach, infection, bleeding, and blood clot. She is at low risk for conscious sedation.  ____________________________ Corey Skains, MD [id number] D: 01/27/2013 10:08:13 ET T: 01/27/2013 10:45:15 ET JOB#: [id number]  cc: Corey Skains, MD, <Dictator> Corey Skains MD ELECTRONICALLY SIGNED 01/28/2013 8:54

## 2014-09-20 DIAGNOSIS — R825 Elevated urine levels of drugs, medicaments and biological substances: Secondary | ICD-10-CM | POA: Insufficient documentation

## 2014-09-20 DIAGNOSIS — R892 Abnormal level of other drugs, medicaments and biological substances in specimens from other organs, systems and tissues: Secondary | ICD-10-CM | POA: Insufficient documentation

## 2014-12-30 ENCOUNTER — Telehealth: Payer: Self-pay | Admitting: Gastroenterology

## 2014-12-30 ENCOUNTER — Other Ambulatory Visit: Payer: Self-pay

## 2014-12-30 ENCOUNTER — Other Ambulatory Visit
Admission: RE | Admit: 2014-12-30 | Discharge: 2014-12-30 | Disposition: A | Discharge status: Home / Self Care | Payer: Medicare Other | Source: Ambulatory Visit | Attending: Gastroenterology | Admitting: Gastroenterology

## 2014-12-30 DIAGNOSIS — B182 Chronic viral hepatitis C: Secondary | ICD-10-CM | POA: Diagnosis not present

## 2014-12-30 LAB — HEPATIC FUNCTION PANEL
ALBUMIN: 4.1 g/dL (ref 3.5–5.0)
ALK PHOS: 102 U/L (ref 38–126)
ALT: 19 U/L (ref 14–54)
AST: 21 U/L (ref 15–41)
BILIRUBIN TOTAL: 0.6 mg/dL (ref 0.3–1.2)
Bilirubin, Direct: <0.1 mg/dL — ABNORMAL LOW (ref 0.1–0.5)
TOTAL PROTEIN: 8 g/dL (ref 6.5–8.1)

## 2014-12-30 NOTE — Telephone Encounter (Signed)
Patient left a voice message for Dr. Dorothey Baseman nurse to call her.

## 2014-12-30 NOTE — Telephone Encounter (Signed)
Spoke with pt regarding an appt with our surgeons. Advised her she will need to schedule an appt with her PCP to get a gallbladder workup to determine if surgery is required. Once they establish this she can schedule the appt with [place]. Pt understood and will get this scheduled.

## 2014-12-31 ENCOUNTER — Ambulatory Visit (INDEPENDENT_AMBULATORY_CARE_PROVIDER_SITE_OTHER): Payer: Medicare Other | Admitting: Family Medicine

## 2014-12-31 ENCOUNTER — Encounter: Payer: Self-pay | Admitting: Family Medicine

## 2014-12-31 VITALS — HR 93 | Temp 98.9°F | Resp 17 | Ht 64.0 in | Wt 141.9 lb

## 2014-12-31 DIAGNOSIS — R1013 Epigastric pain: Secondary | ICD-10-CM | POA: Insufficient documentation
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
## 2015-01-01 LAB — HEPATITIS C GENOTYPE

## 2015-01-01 LAB — HCV RNA QUANT RFLX ULTRA OR GENOTYP
HCV RNA QNT(LOG COPY/ML): 5.438 {Log_IU}/mL
HEPATITIS C QUANTITATION: 274000 [IU]/mL

## 2015-01-06 ENCOUNTER — Telehealth: Payer: Self-pay

## 2015-01-06 NOTE — Telephone Encounter (Signed)
Yes please

## 2015-01-06 NOTE — Telephone Encounter (Signed)
LVM for pt to return my call. FYI.. This was a [place] pt who was treated for Hep C in February. These were her 6 months repeat labs. Would you like her to follow up in the office?

## 2015-01-06 NOTE — Telephone Encounter (Signed)
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

## 2015-01-06 NOTE — Telephone Encounter (Signed)
Pt scheduled with Dr. Allen Norris on 02/13/15 due to failure of Hep C treatment.

## 2015-01-07 ENCOUNTER — Telehealth: Payer: Self-pay

## 2015-01-07 NOTE — Telephone Encounter (Signed)
Pt scheduled for a follow up appt with Dr. Allen Norris due to pt completing Harvoni treatment 6 month ago. Viral load come back still active.

## 2015-01-07 NOTE — Telephone Encounter (Signed)
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

## 2015-01-13 ENCOUNTER — Ambulatory Visit
Admission: RE | Admit: 2015-01-13 | Discharge: 2015-01-13 | Disposition: A | Discharge status: Home / Self Care | Payer: Medicare Other | Source: Ambulatory Visit | Attending: Family Medicine | Admitting: Family Medicine

## 2015-01-13 ENCOUNTER — Other Ambulatory Visit
Admission: RE | Admit: 2015-01-13 | Discharge: 2015-01-13 | Disposition: A | Discharge status: Home / Self Care | Payer: Medicare Other | Source: Ambulatory Visit | Attending: Family Medicine | Admitting: Family Medicine

## 2015-01-13 DIAGNOSIS — R1013 Epigastric pain: Secondary | ICD-10-CM | POA: Insufficient documentation

## 2015-01-13 DIAGNOSIS — K8020 Calculus of gallbladder without cholecystitis without obstruction: Secondary | ICD-10-CM | POA: Insufficient documentation

## 2015-01-13 DIAGNOSIS — I700 Atherosclerosis of aorta: Secondary | ICD-10-CM | POA: Diagnosis not present

## 2015-01-13 LAB — CBC WITH DIFFERENTIAL/PLATELET
BASOS ABS: 0.1 10*3/uL (ref 0–0.1)
Basophils Relative: 1 %
EOS PCT: 2 %
Eosinophils Absolute: 0.1 10*3/uL (ref 0–0.7)
HCT: 45.4 % (ref 35.0–47.0)
Hemoglobin: 15.1 g/dL (ref 12.0–16.0)
LYMPHS ABS: 1.6 10*3/uL (ref 1.0–3.6)
LYMPHS PCT: 21 %
MCH: 30.7 pg (ref 26.0–34.0)
MCHC: 33.2 g/dL (ref 32.0–36.0)
MCV: 92.7 fL (ref 80.0–100.0)
MONO ABS: 0.7 10*3/uL (ref 0.2–0.9)
MONOS PCT: 9 %
Neutro Abs: 5.2 10*3/uL (ref 1.4–6.5)
Neutrophils Relative %: 67 %
PLATELETS: 282 10*3/uL (ref 150–440)
RBC: 4.9 MIL/uL (ref 3.80–5.20)
RDW: 13.8 % (ref 11.5–14.5)
WBC: 7.7 10*3/uL (ref 3.6–11.0)

## 2015-01-13 LAB — COMPREHENSIVE METABOLIC PANEL
ALT: 20 U/L (ref 14–54)
AST: 21 U/L (ref 15–41)
Albumin: 3.7 g/dL (ref 3.5–5.0)
Alkaline Phosphatase: 100 U/L (ref 38–126)
Anion gap: 5 (ref 5–15)
BUN: 15 mg/dL (ref 6–20)
CHLORIDE: 107 mmol/L (ref 101–111)
CO2: 27 mmol/L (ref 22–32)
Calcium: 9.4 mg/dL (ref 8.9–10.3)
Creatinine, Ser: 0.87 mg/dL (ref 0.44–1.00)
Glucose, Bld: 87 mg/dL (ref 65–99)
POTASSIUM: 4.4 mmol/L (ref 3.5–5.1)
Sodium: 139 mmol/L (ref 135–145)
TOTAL PROTEIN: 7.4 g/dL (ref 6.5–8.1)
Total Bilirubin: 0.6 mg/dL (ref 0.3–1.2)

## 2015-01-13 LAB — LIPASE, BLOOD: LIPASE: 33 U/L (ref 22–51)

## 2015-01-13 LAB — AMYLASE: AMYLASE: 67 U/L (ref 28–100)

## 2015-01-15 ENCOUNTER — Telehealth: Payer: Self-pay | Admitting: Family Medicine

## 2015-01-15 NOTE — Telephone Encounter (Signed)
Appointment made for 01-21-15

## 2015-01-21 ENCOUNTER — Emergency Department
Admission: EM | Admit: 2015-01-21 | Discharge: 2015-01-21 | Disposition: A | Discharge status: Home / Self Care | Payer: Medicare Other | Attending: Emergency Medicine | Admitting: Emergency Medicine

## 2015-01-21 ENCOUNTER — Emergency Department: Payer: Medicare Other

## 2015-01-21 ENCOUNTER — Ambulatory Visit (INDEPENDENT_AMBULATORY_CARE_PROVIDER_SITE_OTHER): Payer: Medicare Other | Admitting: Family Medicine

## 2015-01-21 ENCOUNTER — Encounter: Payer: Self-pay | Admitting: Family Medicine

## 2015-01-21 VITALS — BP 111/75 | HR 75 | Temp 98.4°F | Resp 18 | Ht 64.0 in | Wt 143.7 lb

## 2015-01-21 DIAGNOSIS — Z720 Tobacco use: Secondary | ICD-10-CM | POA: Diagnosis not present

## 2015-01-21 DIAGNOSIS — Z79899 Other long term (current) drug therapy: Secondary | ICD-10-CM | POA: Insufficient documentation

## 2015-01-21 DIAGNOSIS — R1011 Right upper quadrant pain: Secondary | ICD-10-CM | POA: Diagnosis not present

## 2015-01-21 DIAGNOSIS — R079 Chest pain, unspecified: Secondary | ICD-10-CM

## 2015-01-21 DIAGNOSIS — I10 Essential (primary) hypertension: Secondary | ICD-10-CM | POA: Insufficient documentation

## 2015-01-21 DIAGNOSIS — R1013 Epigastric pain: Secondary | ICD-10-CM

## 2015-01-21 DIAGNOSIS — R9431 Abnormal electrocardiogram [ECG] [EKG]: Secondary | ICD-10-CM | POA: Diagnosis not present

## 2015-01-21 DIAGNOSIS — R0789 Other chest pain: Secondary | ICD-10-CM | POA: Diagnosis not present

## 2015-01-21 LAB — CBC WITH DIFFERENTIAL/PLATELET
BASOS ABS: 0 10*3/uL (ref 0–0.1)
BASOS PCT: 1 %
EOS ABS: 0.1 10*3/uL (ref 0–0.7)
EOS PCT: 2 %
HCT: 43.4 % (ref 35.0–47.0)
Hemoglobin: 14.9 g/dL (ref 12.0–16.0)
Lymphocytes Relative: 26 %
Lymphs Abs: 1.9 10*3/uL (ref 1.0–3.6)
MCH: 31.5 pg (ref 26.0–34.0)
MCHC: 34.3 g/dL (ref 32.0–36.0)
MCV: 91.9 fL (ref 80.0–100.0)
MONO ABS: 0.7 10*3/uL (ref 0.2–0.9)
Monocytes Relative: 9 %
NEUTROS ABS: 4.5 10*3/uL (ref 1.4–6.5)
Neutrophils Relative %: 62 %
PLATELETS: 335 10*3/uL (ref 150–440)
RBC: 4.73 MIL/uL (ref 3.80–5.20)
RDW: 13.9 % (ref 11.5–14.5)
WBC: 7.3 10*3/uL (ref 3.6–11.0)

## 2015-01-21 LAB — COMPREHENSIVE METABOLIC PANEL
ALBUMIN: 3.9 g/dL (ref 3.5–5.0)
ALT: 19 U/L (ref 14–54)
ANION GAP: 7 (ref 5–15)
AST: 22 U/L (ref 15–41)
Alkaline Phosphatase: 105 U/L (ref 38–126)
BUN: 19 mg/dL (ref 6–20)
CALCIUM: 9.5 mg/dL (ref 8.9–10.3)
CHLORIDE: 106 mmol/L (ref 101–111)
CO2: 25 mmol/L (ref 22–32)
CREATININE: 0.85 mg/dL (ref 0.44–1.00)
GFR calc Af Amer: >60 mL/min (ref >60)
GFR calc non Af Amer: >60 mL/min (ref >60)
Glucose, Bld: 89 mg/dL (ref 65–99)
POTASSIUM: 3.5 mmol/L (ref 3.5–5.1)
SODIUM: 138 mmol/L (ref 135–145)
TOTAL PROTEIN: 7.9 g/dL (ref 6.5–8.1)
Total Bilirubin: 0.5 mg/dL (ref 0.3–1.2)

## 2015-01-21 LAB — LIPASE, BLOOD: Lipase: 60 U/L — ABNORMAL HIGH (ref 22–51)

## 2015-01-21 LAB — TROPONIN I

## 2015-01-21 MED ORDER — GI COCKTAIL ~~LOC~~
30.0000 mL | Freq: Once | ORAL | Status: AC
  Administered 2015-01-21: 30 mL via ORAL
  Filled 2015-01-21: qty 30

## 2015-01-21 MED ORDER — HYDROMORPHONE HCL 2 MG PO TABS
2.0000 mg | ORAL_TABLET | Freq: Once | ORAL | Status: AC
  Administered 2015-01-21: 2 mg via ORAL
  Filled 2015-01-21: qty 1

## 2015-01-21 MED ORDER — HYDROCODONE-ACETAMINOPHEN 5-325 MG PO TABS: 1.0000 | ORAL_TABLET | Freq: Four times a day (QID) | ORAL | Status: DC | PRN

## 2015-01-21 MED ORDER — HYDROMORPHONE HCL 2 MG PO TABS: 2.0000 mg | ORAL_TABLET | Freq: Two times a day (BID) | ORAL | Status: DC | PRN

## 2015-01-21 MED ORDER — GI COCKTAIL ~~LOC~~: 30.0000 mL | Freq: Once | ORAL | Status: AC

## 2015-01-21 NOTE — ED Notes (Signed)
Pt reports has been having intermittent discomfort in her chest over a month and thought it was gallstones. Pt reports went to MD today, had an EKG and was advised to come to the ED.Pt c/o cp with SOB and dizziness intermittently.
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
## 2015-01-21 NOTE — Discharge Instructions (Signed)
Nonspecific Chest Pain  °Chest pain can be caused by many different conditions. There is always a chance that your pain could be related to something serious, such as a heart attack or a blood clot in your lungs. Chest pain can also be caused by conditions that are not life-threatening. If you have chest pain, it is very important to follow up with your health care provider. °CAUSES  °Chest pain can be caused by: °· Heartburn. °· Pneumonia or bronchitis. °· Anxiety or stress. °· Inflammation around your heart (pericarditis) or lung (pleuritis or pleurisy). °· A blood clot in your lung. °· A collapsed lung (pneumothorax). It can develop suddenly on its own (spontaneous pneumothorax) or from trauma to the chest. °· Shingles infection (varicella-zoster virus). °· Heart attack. °· Damage to the bones, muscles, and cartilage that make up your chest wall. This can include: °¨ Bruised bones due to injury. °¨ Strained muscles or cartilage due to frequent or repeated coughing or overwork. °¨ Fracture to one or more ribs. °¨ Sore cartilage due to inflammation (costochondritis). °RISK FACTORS  °Risk factors for chest pain may include: °· Activities that increase your risk for trauma or injury to your chest. °· Respiratory infections or conditions that cause frequent coughing. °· Medical conditions or overeating that can cause heartburn. °· Heart disease or family history of heart disease. °· Conditions or health behaviors that increase your risk of developing a blood clot. °· Having had chicken pox (varicella zoster). °SIGNS AND SYMPTOMS °Chest pain can feel like: °· Burning or tingling on the surface of your chest or deep in your chest. °· Crushing, pressure, aching, or squeezing pain. °· Dull or sharp pain that is worse when you move, cough, or take a deep breath. °· Pain that is also felt in your back, neck, shoulder, or arm, or pain that spreads to any of these areas. °Your chest pain may come and go, or it may stay  constant. °DIAGNOSIS °Lab tests or other studies may be needed to find the cause of your pain. Your health care provider may have you take a test called an ambulatory ECG (electrocardiogram). An ECG records your heartbeat patterns at the time the test is performed. You may also have other tests, such as: °· Transthoracic echocardiogram (TTE). During echocardiography, sound waves are used to create a picture of all of the heart structures and to look at how blood flows through your heart. °· Transesophageal echocardiogram (TEE). This is a more advanced imaging test that obtains images from inside your body. It allows your health care provider to see your heart in finer detail. °· Cardiac monitoring. This allows your health care provider to monitor your heart rate and rhythm in real time. °· Holter monitor. This is a portable device that records your heartbeat and can help to diagnose abnormal heartbeats. It allows your health care provider to track your heart activity for several days, if needed. °· Stress tests. These can be done through exercise or by taking medicine that makes your heart beat more quickly. °· Blood tests. °· Imaging tests. °TREATMENT  °Your treatment depends on what is causing your chest pain. Treatment may include: °· Medicines. These may include: °¨ Acid blockers for heartburn. °¨ Anti-inflammatory medicine. °¨ Pain medicine for inflammatory conditions. °¨ Antibiotic medicine, if an infection is present. °¨ Medicines to dissolve blood clots. °¨ Medicines to treat coronary artery disease. °· Supportive care for conditions that do not require medicines. This may include: °¨ Resting. °¨ Applying heat   or cold packs to injured areas. °¨ Limiting activities until pain decreases. °HOME CARE INSTRUCTIONS °· If you were prescribed an antibiotic medicine, finish it all even if you start to feel better. °· Avoid any activities that bring on chest pain. °· Do not use any tobacco products, including  cigarettes, chewing tobacco, or electronic cigarettes. If you need help quitting, ask your health care provider. °· Do not drink alcohol. °· Take medicines only as directed by your health care provider. °· Keep all follow-up visits as directed by your health care provider. This is important. This includes any further testing if your chest pain does not go away. °· If heartburn is the cause for your chest pain, you may be told to keep your head raised (elevated) while sleeping. This reduces the chance that acid will go from your stomach into your esophagus. °· Make lifestyle changes as directed by your health care provider. These may include: °¨ Getting regular exercise. Ask your health care provider to suggest some activities that are safe for you. °¨ Eating a heart-healthy diet. A registered dietitian can help you to learn healthy eating options. °¨ Maintaining a healthy weight. °¨ Managing diabetes, if necessary. °¨ Reducing stress. °SEEK MEDICAL CARE IF: °· Your chest pain does not go away after treatment. °· You have a rash with blisters on your chest. °· You have a fever. °SEEK IMMEDIATE MEDICAL CARE IF:  °· Your chest pain is worse. °· You have an increasing cough, or you cough up blood. °· You have severe abdominal pain. °· You have severe weakness. °· You faint. °· You have chills. °· You have sudden, unexplained chest discomfort. °· You have sudden, unexplained discomfort in your arms, back, neck, or jaw. °· You have shortness of breath at any time. °· You suddenly start to sweat, or your skin gets clammy. °· You feel nauseous or you vomit. °· You suddenly feel light-headed or dizzy. °· Your heart begins to beat quickly, or it feels like it is skipping beats. °These symptoms may represent a serious problem that is an emergency. Do not wait to see if the symptoms will go away. Get medical help right away. Call your local emergency services (911 in the [place] Do not drive yourself to the hospital. °  °This  information is not intended to replace advice given to you by your health care provider. Make sure you discuss any questions you have with your health care provider. °  °Document Released: 12/30/2004 Document Revised: 04/12/2014 Document Reviewed: 10/26/2013 °Elsevier Interactive Patient Education ©2016 [employer] ° °Please return immediately if condition worsens. Please contact her primary physician or the physician you were given for referral. If you have any specialist physicians involved in her treatment and plan please also contact them. Thank you for using [facility]. ° °
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
## 2015-01-21 NOTE — ED Notes (Signed)
Patient transported to X-ray 

## 2015-01-30 ENCOUNTER — Encounter: Payer: Self-pay | Admitting: Family Medicine

## 2015-01-30 ENCOUNTER — Ambulatory Visit (INDEPENDENT_AMBULATORY_CARE_PROVIDER_SITE_OTHER): Payer: Medicare Other | Admitting: Family Medicine

## 2015-01-30 VITALS — BP 120/70 | HR 82 | Temp 98.5°F | Resp 19 | Ht 62.0 in | Wt 143.2 lb

## 2015-01-30 DIAGNOSIS — J4520 Mild intermittent asthma, uncomplicated: Secondary | ICD-10-CM | POA: Diagnosis not present

## 2015-01-30 DIAGNOSIS — K8020 Calculus of gallbladder without cholecystitis without obstruction: Secondary | ICD-10-CM | POA: Insufficient documentation

## 2015-01-30 DIAGNOSIS — I10 Essential (primary) hypertension: Secondary | ICD-10-CM | POA: Diagnosis not present

## 2015-01-30 MED ORDER — CLONIDINE HCL 0.1 MG PO TABS
0.1000 mg | ORAL_TABLET | Freq: Two times a day (BID) | ORAL | Status: DC
Start: 2015-01-30 — End: 2015-07-08

## 2015-01-30 MED ORDER — ALBUTEROL SULFATE HFA 108 (90 BASE) MCG/ACT IN AERS: 2.0000 | INHALATION_SPRAY | Freq: Four times a day (QID) | RESPIRATORY_TRACT | Status: DC | PRN
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
## 2015-02-05 ENCOUNTER — Ambulatory Visit (INDEPENDENT_AMBULATORY_CARE_PROVIDER_SITE_OTHER): Payer: Medicare Other | Admitting: General Surgery

## 2015-02-05 ENCOUNTER — Encounter: Payer: Self-pay | Admitting: General Surgery

## 2015-02-05 VITALS — BP 110/68 | HR 74 | Resp 12 | Ht 62.0 in | Wt 139.0 lb

## 2015-02-05 DIAGNOSIS — R1013 Epigastric pain: Secondary | ICD-10-CM | POA: Insufficient documentation

## 2015-02-05 DIAGNOSIS — K8020 Calculus of gallbladder without cholecystitis without obstruction: Secondary | ICD-10-CM

## 2015-02-05 DIAGNOSIS — G8929 Other chronic pain: Secondary | ICD-10-CM | POA: Insufficient documentation

## 2015-02-05 MED ORDER — OXYCODONE-ACETAMINOPHEN 5-325 MG PO TABS: 1.0000 | ORAL_TABLET | ORAL | Status: DC | PRN

## 2015-02-05 NOTE — Patient Instructions (Addendum)
Patient to have a HIDA scan.  This patient has been scheduled for a HIDA scan without CCK at [facility] for 02-13-15 at 9 am (arrive 8:45 am). Prep: NPO after midnight, no smoking, no gum, and no narcotic pain medications the morning of scan.
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
## 2015-02-12 DIAGNOSIS — N644 Mastodynia: Secondary | ICD-10-CM | POA: Insufficient documentation

## 2015-02-12 DIAGNOSIS — F319 Bipolar disorder, unspecified: Secondary | ICD-10-CM | POA: Insufficient documentation

## 2015-02-12 DIAGNOSIS — J449 Chronic obstructive pulmonary disease, unspecified: Secondary | ICD-10-CM | POA: Insufficient documentation

## 2015-02-13 ENCOUNTER — Ambulatory Visit: Payer: Medicare Other | Admitting: Gastroenterology

## 2015-02-13 ENCOUNTER — Ambulatory Visit: Payer: Self-pay | Admitting: Gastroenterology

## 2015-02-13 ENCOUNTER — Encounter
Admission: RE | Admit: 2015-02-13 | Discharge: 2015-02-13 | Disposition: A | Discharge status: Home / Self Care | Payer: Medicare Other | Source: Ambulatory Visit | Attending: General Surgery | Admitting: General Surgery

## 2015-02-13 DIAGNOSIS — K8020 Calculus of gallbladder without cholecystitis without obstruction: Secondary | ICD-10-CM

## 2015-02-13 DIAGNOSIS — R1013 Epigastric pain: Secondary | ICD-10-CM

## 2015-03-31 ENCOUNTER — Other Ambulatory Visit: Payer: Self-pay | Admitting: Family Medicine

## 2015-05-02 ENCOUNTER — Ambulatory Visit: Payer: Medicare Other | Admitting: Family Medicine

## 2015-06-18 ENCOUNTER — Encounter: Payer: Self-pay | Admitting: Family Medicine

## 2015-06-18 ENCOUNTER — Ambulatory Visit (INDEPENDENT_AMBULATORY_CARE_PROVIDER_SITE_OTHER): Payer: Medicare Other | Admitting: Family Medicine

## 2015-06-18 VITALS — BP 112/66 | HR 87 | Temp 98.9°F | Resp 16 | Ht 62.0 in | Wt 134.0 lb

## 2015-06-18 DIAGNOSIS — R1030 Lower abdominal pain, unspecified: Secondary | ICD-10-CM | POA: Diagnosis not present

## 2015-06-18 DIAGNOSIS — K625 Hemorrhage of anus and rectum: Secondary | ICD-10-CM | POA: Insufficient documentation

## 2015-06-18 DIAGNOSIS — R109 Unspecified abdominal pain: Secondary | ICD-10-CM | POA: Insufficient documentation

## 2015-06-18 DIAGNOSIS — B029 Zoster without complications: Secondary | ICD-10-CM | POA: Diagnosis not present

## 2015-06-18 MED ORDER — VALACYCLOVIR HCL 1 G PO TABS: 1000.0000 mg | ORAL_TABLET | Freq: Three times a day (TID) | ORAL | Status: DC

## 2015-06-18 MED ORDER — OXYCODONE-ACETAMINOPHEN 5-325 MG PO TABS: 1.0000 | ORAL_TABLET | Freq: Four times a day (QID) | ORAL | Status: DC | PRN
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
## 2015-06-19 ENCOUNTER — Other Ambulatory Visit
Admission: RE | Admit: 2015-06-19 | Discharge: 2015-06-19 | Disposition: A | Discharge status: Home / Self Care | Payer: Medicare Other | Source: Ambulatory Visit | Attending: Family Medicine | Admitting: Family Medicine

## 2015-06-19 ENCOUNTER — Other Ambulatory Visit: Payer: Self-pay | Admitting: Family Medicine

## 2015-06-19 ENCOUNTER — Inpatient Hospital Stay: Admit: 2015-06-19 | Payer: Self-pay

## 2015-06-19 DIAGNOSIS — K8020 Calculus of gallbladder without cholecystitis without obstruction: Secondary | ICD-10-CM

## 2015-06-19 DIAGNOSIS — Z029 Encounter for administrative examinations, unspecified: Secondary | ICD-10-CM | POA: Insufficient documentation

## 2015-06-19 LAB — CBC WITH DIFFERENTIAL/PLATELET
Basophils Absolute: 0.1 10*3/uL (ref 0–0.1)
Basophils Relative: 1 %
EOS ABS: 0.1 10*3/uL (ref 0–0.7)
EOS PCT: 1 %
HCT: 45 % (ref 35.0–47.0)
Hemoglobin: 15.3 g/dL (ref 12.0–16.0)
LYMPHS ABS: 1.7 10*3/uL (ref 1.0–3.6)
LYMPHS PCT: 16 %
MCH: 31.3 pg (ref 26.0–34.0)
MCHC: 33.9 g/dL (ref 32.0–36.0)
MCV: 92.4 fL (ref 80.0–100.0)
MONO ABS: 0.8 10*3/uL (ref 0.2–0.9)
MONOS PCT: 7 %
Neutro Abs: 8.4 10*3/uL — ABNORMAL HIGH (ref 1.4–6.5)
Neutrophils Relative %: 75 %
PLATELETS: 343 10*3/uL (ref 150–440)
RBC: 4.87 MIL/uL (ref 3.80–5.20)
RDW: 14.4 % (ref 11.5–14.5)
WBC: 11.1 10*3/uL — ABNORMAL HIGH (ref 3.6–11.0)

## 2015-06-19 LAB — HEPATIC FUNCTION PANEL
ALT: 20 U/L (ref 14–54)
AST: 22 U/L (ref 15–41)
Albumin: 4 g/dL (ref 3.5–5.0)
Alkaline Phosphatase: 108 U/L (ref 38–126)
Bilirubin, Direct: <0.1 mg/dL — ABNORMAL LOW (ref 0.1–0.5)
TOTAL PROTEIN: 8.2 g/dL — AB (ref 6.5–8.1)
Total Bilirubin: 0.7 mg/dL (ref 0.3–1.2)

## 2015-06-19 LAB — BASIC METABOLIC PANEL
Anion gap: 9 (ref 5–15)
BUN: 28 mg/dL — ABNORMAL HIGH (ref 6–20)
CHLORIDE: 102 mmol/L (ref 101–111)
CO2: 28 mmol/L (ref 22–32)
Calcium: 9.7 mg/dL (ref 8.9–10.3)
Creatinine, Ser: 1.14 mg/dL — ABNORMAL HIGH (ref 0.44–1.00)
GFR, EST NON AFRICAN AMERICAN: 56 mL/min — AB (ref >60)
Glucose, Bld: 100 mg/dL — ABNORMAL HIGH (ref 65–99)
Potassium: 3.9 mmol/L (ref 3.5–5.1)
SODIUM: 139 mmol/L (ref 135–145)

## 2015-06-23 ENCOUNTER — Ambulatory Visit
Admission: RE | Admit: 2015-06-23 | Discharge: 2015-06-23 | Disposition: A | Discharge status: Home / Self Care | Payer: Medicare Other | Source: Ambulatory Visit | Attending: Family Medicine | Admitting: Family Medicine

## 2015-06-23 DIAGNOSIS — K8020 Calculus of gallbladder without cholecystitis without obstruction: Secondary | ICD-10-CM | POA: Diagnosis not present

## 2015-06-23 DIAGNOSIS — R937 Abnormal findings on diagnostic imaging of other parts of musculoskeletal system: Secondary | ICD-10-CM | POA: Insufficient documentation

## 2015-06-23 DIAGNOSIS — R1030 Lower abdominal pain, unspecified: Secondary | ICD-10-CM | POA: Insufficient documentation

## 2015-06-25 ENCOUNTER — Ambulatory Visit: Payer: Self-pay | Admitting: Surgery

## 2015-07-01 ENCOUNTER — Telehealth: Payer: Self-pay | Admitting: Gastroenterology

## 2015-07-01 NOTE — Telephone Encounter (Signed)
I have spoke with patient about missed appointment with Dr Dahlia Byes for Gallbladder per referral from PCP and she stated that she feels much better and did not want to see a General Surgeon at this time. She stated that she is having rectal bleeding and pain. She has a referral to GI for Dr Allen Norris (Current Patient). Please call patient for triage. She states that she would like this done ASAP.   Patient has not followed up for Hep-C treatment. Per the notes in chart patient currently has shingles.

## 2015-07-02 ENCOUNTER — Telehealth: Payer: Self-pay

## 2015-07-02 ENCOUNTER — Other Ambulatory Visit: Payer: Self-pay

## 2015-07-02 DIAGNOSIS — B182 Chronic viral hepatitis C: Secondary | ICD-10-CM

## 2015-07-02 NOTE — Telephone Encounter (Signed)
Gastroenterology Pre-Procedure Review  Request Date: 07/17/15 Requesting Physician: Dr. Manuella Ghazi  PATIENT REVIEW QUESTIONS: The patient responded to the following health history questions as indicated:    1. Are you having any GI issues? yes (Rectal bleeding ) 2. Do you have a personal history of Polyps? no 3. Do you have a family history of Colon Cancer or Polyps? no 4. Diabetes Mellitus? no 5. Joint replacements in the past 12 months?no 6. Major health problems in the past 3 months?no 7. Any artificial heart valves, MVP, or defibrillator?no    MEDICATIONS & ALLERGIES:    Patient reports the following regarding taking any anticoagulation/antiplatelet therapy:   Plavix, Coumadin, Eliquis, Xarelto, Lovenox, Pradaxa, Brilinta, or Effient? no Aspirin? no  Patient confirms/reports the following medications:  Current Outpatient Prescriptions  Medication Sig Dispense Refill  . aspirin 81 MG tablet Take 81 mg by mouth daily.    . cloNIDine (CATAPRES) 0.1 MG tablet Take 1 tablet (0.1 mg total) by mouth 2 (two) times daily. 60 tablet 2  . gabapentin (NEURONTIN) 400 MG capsule Take 400 mg by mouth 4 (four) times daily.  4  . HYDROmorphone (DILAUDID) 2 MG tablet Take 1 tablet (2 mg total) by mouth every 12 (twelve) hours as needed for severe pain. 20 tablet 0  . oxyCODONE-acetaminophen (ROXICET) 5-325 MG tablet Take 1 tablet by mouth every 6 (six) hours as needed for severe pain. 28 tablet 0  . pantoprazole (PROTONIX) 40 MG tablet Take 40 mg by mouth daily.     Marland Kitchen PROAIR HFA 108 (90 Base) MCG/ACT inhaler inhale 2 puffs every 6 hours if needed for wheezing and shortness of breath 8.5 Inhaler 0  . tiotropium (SPIRIVA HANDIHALER) 18 MCG inhalation capsule Place 18 mcg into inhaler and inhale daily.     . valACYclovir (VALTREX) 1000 MG tablet Take 1 tablet (1,000 mg total) by mouth 3 (three) times daily. 21 tablet 0   No current facility-administered medications for this visit.    Patient  confirms/reports the following allergies:  No Known Allergies  No orders of the defined types were placed in this encounter.    AUTHORIZATION INFORMATION Primary Insurance: 1D#: Group #:  Secondary Insurance: 1D#: Group #:  SCHEDULE INFORMATION: Date: 07/17/15 Time: Location: [place]

## 2015-07-02 NOTE — Telephone Encounter (Signed)
Left message for pt to call back to schedule colonoscopy. 

## 2015-07-03 NOTE — Telephone Encounter (Signed)
Based on the information provided you are not required to submit a notification/prior authorization for this service. Decision ID #: [id number]

## 2015-07-03 NOTE — Telephone Encounter (Signed)
Pt has been scheduled for colonoscopy at [facility] on 07/17/15 for rectal bleeding K62.5. Instructs/rx mailed. Please precert

## 2015-07-08 ENCOUNTER — Ambulatory Visit (INDEPENDENT_AMBULATORY_CARE_PROVIDER_SITE_OTHER): Payer: Medicare Other | Admitting: Family Medicine

## 2015-07-08 ENCOUNTER — Encounter: Payer: Self-pay | Admitting: Family Medicine

## 2015-07-08 VITALS — BP 110/78 | HR 99 | Temp 98.5°F | Resp 18 | Ht 62.0 in | Wt 133.4 lb

## 2015-07-08 DIAGNOSIS — I10 Essential (primary) hypertension: Secondary | ICD-10-CM | POA: Diagnosis not present

## 2015-07-08 DIAGNOSIS — R079 Chest pain, unspecified: Secondary | ICD-10-CM | POA: Diagnosis not present

## 2015-07-08 DIAGNOSIS — M8448XA Pathological fracture, other site, initial encounter for fracture: Secondary | ICD-10-CM

## 2015-07-08 DIAGNOSIS — J449 Chronic obstructive pulmonary disease, unspecified: Secondary | ICD-10-CM | POA: Diagnosis not present

## 2015-07-08 MED ORDER — ALBUTEROL SULFATE HFA 108 (90 BASE) MCG/ACT IN AERS: 2.0000 | INHALATION_SPRAY | Freq: Four times a day (QID) | RESPIRATORY_TRACT | Status: DC | PRN

## 2015-07-08 MED ORDER — CLONIDINE HCL 0.1 MG PO TABS: 0.1000 mg | ORAL_TABLET | Freq: Two times a day (BID) | ORAL | Status: DC
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
## 2015-07-10 DIAGNOSIS — I25119 Atherosclerotic heart disease of native coronary artery with unspecified angina pectoris: Secondary | ICD-10-CM | POA: Diagnosis not present

## 2015-07-10 DIAGNOSIS — I10 Essential (primary) hypertension: Secondary | ICD-10-CM | POA: Diagnosis not present

## 2015-07-10 DIAGNOSIS — I200 Unstable angina: Secondary | ICD-10-CM | POA: Diagnosis not present

## 2015-07-10 DIAGNOSIS — Z7189 Other specified counseling: Secondary | ICD-10-CM | POA: Diagnosis not present

## 2015-07-16 ENCOUNTER — Encounter: Payer: Self-pay | Admitting: Certified Registered Nurse Anesthetist

## 2015-07-16 ENCOUNTER — Encounter: Payer: Self-pay | Admitting: *Deleted

## 2015-07-16 ENCOUNTER — Encounter
Admission: RE | Disposition: A | Discharge status: Home / Self Care | Payer: Self-pay | Source: Ambulatory Visit | Attending: Internal Medicine

## 2015-07-16 ENCOUNTER — Ambulatory Visit: Admission: AD | Admit: 2015-07-16 | Payer: Medicare Other | Source: Ambulatory Visit | Admitting: Internal Medicine

## 2015-07-16 ENCOUNTER — Observation Stay
Admission: RE | Admit: 2015-07-16 | Discharge: 2015-07-17 | Disposition: A | Discharge status: Home / Self Care | Payer: Medicare Other | Source: Ambulatory Visit | Attending: Internal Medicine | Admitting: Internal Medicine

## 2015-07-16 ENCOUNTER — Other Ambulatory Visit: Payer: Self-pay

## 2015-07-16 DIAGNOSIS — F17210 Nicotine dependence, cigarettes, uncomplicated: Secondary | ICD-10-CM | POA: Diagnosis not present

## 2015-07-16 DIAGNOSIS — K219 Gastro-esophageal reflux disease without esophagitis: Secondary | ICD-10-CM | POA: Insufficient documentation

## 2015-07-16 DIAGNOSIS — Z8249 Family history of ischemic heart disease and other diseases of the circulatory system: Secondary | ICD-10-CM | POA: Diagnosis not present

## 2015-07-16 DIAGNOSIS — Z8589 Personal history of malignant neoplasm of other organs and systems: Secondary | ICD-10-CM | POA: Insufficient documentation

## 2015-07-16 DIAGNOSIS — F17200 Nicotine dependence, unspecified, uncomplicated: Secondary | ICD-10-CM | POA: Diagnosis not present

## 2015-07-16 DIAGNOSIS — I252 Old myocardial infarction: Secondary | ICD-10-CM | POA: Diagnosis not present

## 2015-07-16 DIAGNOSIS — J449 Chronic obstructive pulmonary disease, unspecified: Secondary | ICD-10-CM | POA: Insufficient documentation

## 2015-07-16 DIAGNOSIS — I200 Unstable angina: Secondary | ICD-10-CM | POA: Diagnosis not present

## 2015-07-16 DIAGNOSIS — Z801 Family history of malignant neoplasm of trachea, bronchus and lung: Secondary | ICD-10-CM | POA: Insufficient documentation

## 2015-07-16 DIAGNOSIS — Z803 Family history of malignant neoplasm of breast: Secondary | ICD-10-CM | POA: Insufficient documentation

## 2015-07-16 DIAGNOSIS — Z79899 Other long term (current) drug therapy: Secondary | ICD-10-CM | POA: Insufficient documentation

## 2015-07-16 DIAGNOSIS — R0602 Shortness of breath: Secondary | ICD-10-CM | POA: Insufficient documentation

## 2015-07-16 DIAGNOSIS — Z9221 Personal history of antineoplastic chemotherapy: Secondary | ICD-10-CM | POA: Insufficient documentation

## 2015-07-16 DIAGNOSIS — Z923 Personal history of irradiation: Secondary | ICD-10-CM | POA: Diagnosis not present

## 2015-07-16 DIAGNOSIS — I25119 Atherosclerotic heart disease of native coronary artery with unspecified angina pectoris: Secondary | ICD-10-CM | POA: Diagnosis not present

## 2015-07-16 DIAGNOSIS — I208 Other forms of angina pectoris: Secondary | ICD-10-CM | POA: Diagnosis present

## 2015-07-16 DIAGNOSIS — I10 Essential (primary) hypertension: Secondary | ICD-10-CM | POA: Diagnosis not present

## 2015-07-16 DIAGNOSIS — Z7982 Long term (current) use of aspirin: Secondary | ICD-10-CM | POA: Diagnosis not present

## 2015-07-16 DIAGNOSIS — I2089 Other forms of angina pectoris: Secondary | ICD-10-CM | POA: Diagnosis present

## 2015-07-16 DIAGNOSIS — I2510 Atherosclerotic heart disease of native coronary artery without angina pectoris: Secondary | ICD-10-CM | POA: Diagnosis not present

## 2015-07-16 HISTORY — PX: CARDIAC CATHETERIZATION: SHX172

## 2015-07-16 HISTORY — DX: Other long term (current) drug therapy: Z79.899

## 2015-07-16 HISTORY — DX: Long term (current) use of other immunomodulators and immunosuppressants: Z79.69

## 2015-07-16 HISTORY — DX: Other specified health status: Z78.9

## 2015-07-16 SURGERY — LEFT HEART CATH AND CORONARY ANGIOGRAPHY
Anesthesia: Moderate Sedation | Laterality: Bilateral

## 2015-07-16 SURGERY — LEFT HEART CATH AND CORONARY ANGIOGRAPHY
Anesthesia: Moderate Sedation

## 2015-07-16 MED ORDER — FENTANYL CITRATE (PF) 100 MCG/2ML IJ SOLN
INTRAMUSCULAR | Status: AC
  Filled 2015-07-16: qty 2

## 2015-07-16 MED ORDER — OXYCODONE-ACETAMINOPHEN 5-325 MG PO TABS
1.0000 | ORAL_TABLET | ORAL | Status: DC | PRN
  Administered 2015-07-16 – 2015-07-17 (×4): 2 via ORAL
  Filled 2015-07-16 (×4): qty 2

## 2015-07-16 MED ORDER — LABETALOL HCL 5 MG/ML IV SOLN
10.0000 mg | Freq: Once | INTRAVENOUS | Status: AC
  Administered 2015-07-16: 10 mg via INTRAVENOUS

## 2015-07-16 MED ORDER — ACETAMINOPHEN 325 MG PO TABS: 650.0000 mg | ORAL_TABLET | ORAL | Status: DC | PRN

## 2015-07-16 MED ORDER — METOPROLOL TARTRATE 25 MG PO TABS
25.0000 mg | ORAL_TABLET | Freq: Two times a day (BID) | ORAL | Status: DC
  Administered 2015-07-16 (×2): 25 mg via ORAL
  Filled 2015-07-16 (×3): qty 1

## 2015-07-16 MED ORDER — BIVALIRUDIN 250 MG IV SOLR
INTRAVENOUS | Status: AC
  Filled 2015-07-16: qty 250

## 2015-07-16 MED ORDER — NITROGLYCERIN 5 MG/ML IV SOLN
INTRAVENOUS | Status: AC
Start: 2015-07-16 — End: 2015-07-16
  Filled 2015-07-16: qty 10

## 2015-07-16 MED ORDER — BIVALIRUDIN 250 MG IV SOLR
250.0000 mg | INTRAVENOUS | Status: DC | PRN
  Administered 2015-07-16: 1.75 mg/kg/h via INTRAVENOUS

## 2015-07-16 MED ORDER — KETOCONAZOLE 2 % EX CREA
TOPICAL_CREAM | Freq: Every day | CUTANEOUS | Status: DC | PRN
  Filled 2015-07-16: qty 15

## 2015-07-16 MED ORDER — ASPIRIN 81 MG PO CHEW
CHEWABLE_TABLET | ORAL | Status: AC
  Administered 2015-07-16: 12:00:00
  Filled 2015-07-16: qty 1

## 2015-07-16 MED ORDER — SODIUM CHLORIDE 0.9% FLUSH: 3.0000 mL | INTRAVENOUS | Status: DC | PRN

## 2015-07-16 MED ORDER — TICAGRELOR 90 MG PO TABS
ORAL_TABLET | ORAL | Status: DC | PRN
  Administered 2015-07-16: 180 mg via ORAL

## 2015-07-16 MED ORDER — ASPIRIN 81 MG PO CHEW
CHEWABLE_TABLET | ORAL | Status: AC
  Filled 2015-07-16: qty 3

## 2015-07-16 MED ORDER — CLONIDINE HCL ER 0.1 MG PO TB12
0.1000 mg | ORAL_TABLET | Freq: Every day | ORAL | Status: DC
  Administered 2015-07-16: 0.1 mg via ORAL
  Filled 2015-07-16 (×2): qty 1

## 2015-07-16 MED ORDER — SODIUM CHLORIDE 0.9 % WEIGHT BASED INFUSION
1.0000 mL/kg/h | INTRAVENOUS | Status: DC
Start: 2015-07-17 — End: 2015-07-16

## 2015-07-16 MED ORDER — IOPAMIDOL (ISOVUE-300) INJECTION 61%
INTRAVENOUS | Status: DC | PRN
  Administered 2015-07-16: 295 mL via INTRA_ARTERIAL

## 2015-07-16 MED ORDER — SODIUM CHLORIDE 0.9 % WEIGHT BASED INFUSION
3.0000 mL/kg/h | INTRAVENOUS | Status: DC
  Administered 2015-07-16: 3 mL/kg/h via INTRAVENOUS

## 2015-07-16 MED ORDER — IBUPROFEN 400 MG PO TABS
400.0000 mg | ORAL_TABLET | Freq: Four times a day (QID) | ORAL | Status: DC | PRN
  Filled 2015-07-16: qty 1

## 2015-07-16 MED ORDER — ONDANSETRON HCL 4 MG/2ML IJ SOLN
4.0000 mg | Freq: Four times a day (QID) | INTRAMUSCULAR | Status: DC | PRN
  Administered 2015-07-16: 4 mg via INTRAVENOUS
  Filled 2015-07-16: qty 2

## 2015-07-16 MED ORDER — ATORVASTATIN CALCIUM 20 MG PO TABS
40.0000 mg | ORAL_TABLET | Freq: Every day | ORAL | Status: DC
  Administered 2015-07-16: 40 mg via ORAL
  Filled 2015-07-16 (×2): qty 2

## 2015-07-16 MED ORDER — NICOTINE 21 MG/24HR TD PT24
21.0000 mg | MEDICATED_PATCH | Freq: Every day | TRANSDERMAL | Status: DC
  Administered 2015-07-16 – 2015-07-17 (×3): 21 mg via TRANSDERMAL
  Filled 2015-07-16 (×5): qty 1

## 2015-07-16 MED ORDER — MIDAZOLAM HCL 2 MG/2ML IJ SOLN
INTRAMUSCULAR | Status: AC
  Filled 2015-07-16: qty 2

## 2015-07-16 MED ORDER — SODIUM CHLORIDE 0.9% FLUSH
3.0000 mL | Freq: Two times a day (BID) | INTRAVENOUS | Status: DC
  Administered 2015-07-17: 3 mL via INTRAVENOUS

## 2015-07-16 MED ORDER — MIDAZOLAM HCL 2 MG/2ML IJ SOLN
INTRAMUSCULAR | Status: DC | PRN
  Administered 2015-07-16 (×2): 1 mg via INTRAVENOUS

## 2015-07-16 MED ORDER — AMLODIPINE BESYLATE 5 MG PO TABS
10.0000 mg | ORAL_TABLET | Freq: Once | ORAL | Status: AC
  Administered 2015-07-16: 10 mg via ORAL
  Filled 2015-07-16: qty 2

## 2015-07-16 MED ORDER — HYDRALAZINE HCL 20 MG/ML IJ SOLN: 10.0000 mg | Freq: Four times a day (QID) | INTRAMUSCULAR | Status: DC | PRN

## 2015-07-16 MED ORDER — AMITRIPTYLINE HCL 50 MG PO TABS
50.0000 mg | ORAL_TABLET | Freq: Every day | ORAL | Status: DC
  Administered 2015-07-16: 50 mg via ORAL
  Filled 2015-07-16: qty 1

## 2015-07-16 MED ORDER — TICAGRELOR 90 MG PO TABS
ORAL_TABLET | ORAL | Status: AC
  Filled 2015-07-16: qty 2

## 2015-07-16 MED ORDER — NITROGLYCERIN 1 MG/10 ML FOR IR/CATH LAB
INTRA_ARTERIAL | Status: DC | PRN
  Administered 2015-07-16 (×2): 200 ug via INTRACORONARY

## 2015-07-16 MED ORDER — LABETALOL HCL 5 MG/ML IV SOLN
INTRAVENOUS | Status: AC
  Administered 2015-07-16: 10 mg via INTRAVENOUS
  Filled 2015-07-16: qty 4

## 2015-07-16 MED ORDER — AMLODIPINE BESYLATE 5 MG PO TABS
ORAL_TABLET | ORAL | Status: AC
  Filled 2015-07-16: qty 2

## 2015-07-16 MED ORDER — SODIUM CHLORIDE 0.9 % IV SOLN: 250.0000 mL | INTRAVENOUS | Status: DC | PRN

## 2015-07-16 MED ORDER — PANTOPRAZOLE SODIUM 40 MG PO TBEC
40.0000 mg | DELAYED_RELEASE_TABLET | Freq: Every day | ORAL | Status: DC
  Administered 2015-07-16 – 2015-07-17 (×2): 40 mg via ORAL
  Filled 2015-07-16 (×2): qty 1

## 2015-07-16 MED ORDER — SODIUM CHLORIDE 0.9 % IV SOLN: INTRAVENOUS | Status: DC

## 2015-07-16 MED ORDER — MORPHINE SULFATE ER 30 MG PO TBCR
60.0000 mg | EXTENDED_RELEASE_TABLET | Freq: Two times a day (BID) | ORAL | Status: DC
  Administered 2015-07-16 – 2015-07-17 (×3): 60 mg via ORAL
  Filled 2015-07-16 (×3): qty 2

## 2015-07-16 MED ORDER — ASPIRIN 81 MG PO CHEW
81.0000 mg | CHEWABLE_TABLET | ORAL | Status: AC
  Administered 2015-07-16: 81 mg via ORAL

## 2015-07-16 MED ORDER — LORAZEPAM 2 MG/ML IJ SOLN
1.0000 mg | Freq: Four times a day (QID) | INTRAMUSCULAR | Status: DC | PRN
Start: 2015-07-16 — End: 2015-07-17
  Administered 2015-07-16 – 2015-07-17 (×4): 1 mg via INTRAVENOUS
  Filled 2015-07-16 (×2): qty 1
  Filled 2015-07-16: qty 0.5
  Filled 2015-07-16: qty 1

## 2015-07-16 MED ORDER — NITROGLYCERIN 2 % TD OINT
TOPICAL_OINTMENT | TRANSDERMAL | Status: AC
  Filled 2015-07-16: qty 1

## 2015-07-16 MED ORDER — TIOTROPIUM BROMIDE MONOHYDRATE 18 MCG IN CAPS
18.0000 ug | ORAL_CAPSULE | Freq: Every day | RESPIRATORY_TRACT | Status: DC
  Administered 2015-07-16 – 2015-07-17 (×2): 18 ug via RESPIRATORY_TRACT
  Filled 2015-07-16: qty 5

## 2015-07-16 MED ORDER — BIVALIRUDIN BOLUS VIA INFUSION - CUPID
INTRAVENOUS | Status: DC | PRN
  Administered 2015-07-16: 44.925 mg via INTRAVENOUS

## 2015-07-16 MED ORDER — ALBUTEROL SULFATE (2.5 MG/3ML) 0.083% IN NEBU: 2.5000 mg | INHALATION_SOLUTION | Freq: Four times a day (QID) | RESPIRATORY_TRACT | Status: DC | PRN

## 2015-07-16 MED ORDER — TICAGRELOR 90 MG PO TABS
90.0000 mg | ORAL_TABLET | Freq: Two times a day (BID) | ORAL | Status: DC
  Administered 2015-07-17 (×2): 90 mg via ORAL
  Filled 2015-07-16 (×2): qty 1

## 2015-07-16 MED ORDER — HEPARIN (PORCINE) IN NACL 2-0.9 UNIT/ML-% IJ SOLN
INTRAMUSCULAR | Status: AC
  Filled 2015-07-16: qty 500

## 2015-07-16 MED ORDER — ASPIRIN 81 MG PO CHEW
81.0000 mg | CHEWABLE_TABLET | Freq: Every day | ORAL | Status: DC
  Administered 2015-07-17: 81 mg via ORAL
  Filled 2015-07-16: qty 1

## 2015-07-16 MED ORDER — FENTANYL CITRATE (PF) 100 MCG/2ML IJ SOLN
INTRAMUSCULAR | Status: DC | PRN
  Administered 2015-07-16: 50 ug via INTRAVENOUS

## 2015-07-16 MED ORDER — SODIUM CHLORIDE 0.9% FLUSH
3.0000 mL | INTRAVENOUS | Status: DC | PRN
  Administered 2015-07-16: 3 mL via INTRAVENOUS
  Filled 2015-07-16: qty 3

## 2015-07-16 MED ORDER — SODIUM CHLORIDE 0.9% FLUSH: 3.0000 mL | Freq: Two times a day (BID) | INTRAVENOUS | Status: DC

## 2015-07-16 MED ORDER — ASPIRIN 81 MG PO CHEW
CHEWABLE_TABLET | ORAL | Status: DC | PRN
  Administered 2015-07-16: 243 mg via ORAL

## 2015-07-16 MED ORDER — SODIUM CHLORIDE 0.9 % WEIGHT BASED INFUSION
3.0000 mL/kg/h | INTRAVENOUS | Status: AC
  Administered 2015-07-16: 3 mL/kg/h via INTRAVENOUS

## 2015-07-16 SURGICAL SUPPLY — 17 items
BALLN TREK RX 2.5X15 (BALLOONS) ×3
BALLOON TREK RX 2.5X15 (BALLOONS) IMPLANT
CATH INFINITI 5FR ANG PIGTAIL (CATHETERS) ×3 IMPLANT
CATH INFINITI 5FR JL4 (CATHETERS) ×3 IMPLANT
CATH INFINITI JR4 5F (CATHETERS) ×3 IMPLANT
CATH VISTA GUIDE 6FR XB3 SH (CATHETERS) ×2 IMPLANT
DEVICE CLOSURE MYNXGRIP 6/7F (Vascular Products) ×2 IMPLANT
DEVICE INFLAT 30 PLUS (MISCELLANEOUS) ×2 IMPLANT
KIT MANI 3VAL PERCEP (MISCELLANEOUS) ×3 IMPLANT
NDL PERC 18GX7CM (NEEDLE) ×1 IMPLANT
NEEDLE PERC 18GX7CM (NEEDLE) ×3 IMPLANT
PACK CARDIAC CATH (CUSTOM PROCEDURE TRAY) ×3 IMPLANT
SHEATH AVANTI 5FR X 11CM (SHEATH) ×3 IMPLANT
SHEATH AVANTI 6FR X 11CM (SHEATH) ×2 IMPLANT
STENT XIENCE ALPINE RX 2.75X18 (Permanent Stent) ×2 IMPLANT
WIRE EMERALD 3MM-J .035X150CM (WIRE) ×3 IMPLANT
WIRE G HI TQ BMW 190 (WIRE) ×2 IMPLANT

## 2015-07-16 NOTE — Progress Notes (Signed)
MD notified. Pts BP is elevated. Orders to give scheduled medication and place an order for prn medication. I will continue to assess.

## 2015-07-16 NOTE — OR Nursing (Signed)
SBP reported to MD..See new orders

## 2015-07-16 NOTE — OR Nursing (Signed)
Agitated, reports, chest pressure, "I need to Carol Henderson and i am hungry", assist on bedpan voiding >500 cc urine, repositioned in bed and eating peanutbutter crackers....reporting "I am feeling much better"

## 2015-07-16 NOTE — Progress Notes (Signed)
Pt is status post stent placement. Right groin is clean, dry and intact. No hematoma noted, bilateral pulses are positive.

## 2015-07-17 ENCOUNTER — Encounter: Admission: RE | Payer: Self-pay | Source: Ambulatory Visit

## 2015-07-17 ENCOUNTER — Ambulatory Visit: Admission: RE | Admit: 2015-07-17 | Payer: Medicare Other | Source: Ambulatory Visit | Admitting: Gastroenterology

## 2015-07-17 ENCOUNTER — Encounter: Payer: Self-pay | Admitting: Internal Medicine

## 2015-07-17 DIAGNOSIS — Z923 Personal history of irradiation: Secondary | ICD-10-CM | POA: Diagnosis not present

## 2015-07-17 DIAGNOSIS — Z79899 Other long term (current) drug therapy: Secondary | ICD-10-CM | POA: Diagnosis not present

## 2015-07-17 DIAGNOSIS — K219 Gastro-esophageal reflux disease without esophagitis: Secondary | ICD-10-CM | POA: Diagnosis not present

## 2015-07-17 DIAGNOSIS — Z8249 Family history of ischemic heart disease and other diseases of the circulatory system: Secondary | ICD-10-CM | POA: Diagnosis not present

## 2015-07-17 DIAGNOSIS — I10 Essential (primary) hypertension: Secondary | ICD-10-CM | POA: Diagnosis not present

## 2015-07-17 DIAGNOSIS — Z9221 Personal history of antineoplastic chemotherapy: Secondary | ICD-10-CM | POA: Diagnosis not present

## 2015-07-17 DIAGNOSIS — Z8589 Personal history of malignant neoplasm of other organs and systems: Secondary | ICD-10-CM | POA: Diagnosis not present

## 2015-07-17 DIAGNOSIS — J449 Chronic obstructive pulmonary disease, unspecified: Secondary | ICD-10-CM | POA: Diagnosis not present

## 2015-07-17 DIAGNOSIS — Z803 Family history of malignant neoplasm of breast: Secondary | ICD-10-CM | POA: Diagnosis not present

## 2015-07-17 DIAGNOSIS — R0602 Shortness of breath: Secondary | ICD-10-CM | POA: Diagnosis not present

## 2015-07-17 DIAGNOSIS — F17210 Nicotine dependence, cigarettes, uncomplicated: Secondary | ICD-10-CM | POA: Diagnosis not present

## 2015-07-17 DIAGNOSIS — Z7982 Long term (current) use of aspirin: Secondary | ICD-10-CM | POA: Diagnosis not present

## 2015-07-17 DIAGNOSIS — Z801 Family history of malignant neoplasm of trachea, bronchus and lung: Secondary | ICD-10-CM | POA: Diagnosis not present

## 2015-07-17 DIAGNOSIS — I252 Old myocardial infarction: Secondary | ICD-10-CM | POA: Diagnosis not present

## 2015-07-17 DIAGNOSIS — F17200 Nicotine dependence, unspecified, uncomplicated: Secondary | ICD-10-CM | POA: Diagnosis not present

## 2015-07-17 DIAGNOSIS — I25119 Atherosclerotic heart disease of native coronary artery with unspecified angina pectoris: Secondary | ICD-10-CM | POA: Diagnosis not present

## 2015-07-17 SURGERY — COLONOSCOPY WITH PROPOFOL
Anesthesia: Choice

## 2015-07-17 MED ORDER — TICAGRELOR 90 MG PO TABS: 90.0000 mg | ORAL_TABLET | Freq: Two times a day (BID) | ORAL | Status: DC

## 2015-07-17 MED ORDER — OXYCODONE-ACETAMINOPHEN 5-325 MG PO TABS: 1.0000 | ORAL_TABLET | ORAL | Status: DC | PRN

## 2015-07-17 MED ORDER — MORPHINE SULFATE ER 60 MG PO TBCR: 60.0000 mg | EXTENDED_RELEASE_TABLET | Freq: Two times a day (BID) | ORAL | Status: DC

## 2015-07-17 MED ORDER — ATORVASTATIN CALCIUM 40 MG PO TABS: 40.0000 mg | ORAL_TABLET | Freq: Every day | ORAL | Status: DC

## 2015-07-17 MED ORDER — AMITRIPTYLINE HCL 50 MG PO TABS: 50.0000 mg | ORAL_TABLET | Freq: Every day | ORAL | Status: DC

## 2015-07-17 NOTE — Care Management Obs Status (Signed)
MEDICARE OBSERVATION STATUS NOTIFICATION   Patient Details  Name: Carol Henderson MRN: [id number] Date of Birth: [birth year]   Medicare Observation Status Notification Given:  Yes    Jolly Mango, RN 07/17/2015, 9:59 AM

## 2015-07-17 NOTE — Progress Notes (Signed)
Discharge instructions explained to pt/ verbalized an understanding / iv and tele removed/ transported off unit via wheelchair.  

## 2015-07-17 NOTE — Progress Notes (Signed)
Subjective:  Pt feels well. Denies cp no sob . Right groin feels ok.  Objective:  Vital Signs in the last 24 hours: Temp:  [97.5 F (36.4 C)-98.2 F (36.8 C)] 98.1 F (36.7 C) (04/13 1115) Pulse Rate:  [50-73] 50 (04/13 1115) Resp:  [12-24] 14 (04/13 1115) BP: (105-204)/(63-149) 131/70 mmHg (04/13 1115) SpO2:  [93 %-100 %] 93 % (04/13 1115) Weight:  [70.897 kg (156 lb 4.8 oz)] 70.897 kg (156 lb 4.8 oz) (04/12 1626)  Intake/Output from previous day: 04/12 0701 - 04/13 0700 In: 1079 [P.O.:480; I.V.:599] Out: -  Intake/Output from this shift: Total I/O In: 240 [P.O.:240] Out: 0   Physical Exam: General appearance: alert, cooperative and appears stated age Neck: no adenopathy, no carotid bruit, no JVD, supple, symmetrical, trachea midline and thyroid not enlarged, symmetric, no tenderness/mass/nodules Lungs: clear to auscultation bilaterally Heart: regular rate and rhythm, S1, S2 normal, no murmur, click, rub or gallop Abdomen: soft, non-tender; bowel sounds normal; no masses,  no organomegaly Extremities: extremities normal, atraumatic, no cyanosis or edema Pulses: 2+ and symmetric Skin: Skin color, texture, turgor normal. No rashes or lesions Neurologic: Alert and oriented X 3, normal strength and tone. Normal symmetric reflexes. Normal coordination and gait  Lab Results: No results for input(s): WBC, HGB, PLT in the last 72 hours. No results for input(s): NA, K, CL, CO2, GLUCOSE, BUN, CREATININE in the last 72 hours. No results for input(s): TROPONINI in the last 72 hours.  Invalid input(s): CK, MB Hepatic Function Panel No results for input(s): PROT, ALBUMIN, AST, ALT, ALKPHOS, BILITOT, BILIDIR, IBILI in the last 72 hours. No results for input(s): CHOL in the last 72 hours. No results for input(s): PROTIME in the last 72 hours.  Imaging: Imaging results have been reviewed  Cardiac Studies:  Assessment/Plan:  Angina Chest Pain Coronary Artery Disease Shortness of  Breath  S/P PCI STENT DES Circ GERD Smoking HTN . PLAN D/C home now Continue Brillinta BID x 1 yrs D/C Imdur Continue with Metoprolo HTN control with Clonidine Lipitor for lipid control Inhalers for COPD     CALLWOOD,DWAYNE D. 07/17/2015, 12:32 PM

## 2015-07-31 DIAGNOSIS — I208 Other forms of angina pectoris: Secondary | ICD-10-CM | POA: Diagnosis not present

## 2015-07-31 DIAGNOSIS — R011 Cardiac murmur, unspecified: Secondary | ICD-10-CM | POA: Diagnosis not present

## 2015-07-31 DIAGNOSIS — I2510 Atherosclerotic heart disease of native coronary artery without angina pectoris: Secondary | ICD-10-CM | POA: Diagnosis not present

## 2015-07-31 DIAGNOSIS — R0602 Shortness of breath: Secondary | ICD-10-CM | POA: Diagnosis not present

## 2015-08-12 ENCOUNTER — Ambulatory Visit
Admission: RE | Admit: 2015-08-12 | Discharge: 2015-08-12 | Disposition: A | Discharge status: Home / Self Care | Payer: Medicare Other | Source: Ambulatory Visit | Attending: Family Medicine | Admitting: Family Medicine

## 2015-08-12 DIAGNOSIS — M8448XA Pathological fracture, other site, initial encounter for fracture: Secondary | ICD-10-CM | POA: Diagnosis not present

## 2015-08-12 DIAGNOSIS — S32119A Unspecified Zone I fracture of sacrum, initial encounter for closed fracture: Secondary | ICD-10-CM | POA: Diagnosis not present

## 2015-09-15 ENCOUNTER — Telehealth: Payer: Self-pay

## 2015-09-15 DIAGNOSIS — M8448XD Pathological fracture, other site, subsequent encounter for fracture with routine healing: Secondary | ICD-10-CM

## 2015-09-15 MED ORDER — OXYCODONE-ACETAMINOPHEN 5-325 MG PO TABS: 1.0000 | ORAL_TABLET | Freq: Three times a day (TID) | ORAL | Status: DC | PRN

## 2015-09-15 NOTE — Telephone Encounter (Signed)
Carol Henderson has sacral insufficiency fractures and will need a referral to neurosurgery. For pain control, will prescribe Percocet patient has taken it before and tolerated it well. She will contact tomorrow morning to pick up her prescription.

## 2015-09-15 NOTE — Telephone Encounter (Signed)
Routed to Dr. Manuella Ghazi for return call to patient and pain medication approval

## 2015-10-23 DIAGNOSIS — M8448XA Pathological fracture, other site, initial encounter for fracture: Secondary | ICD-10-CM | POA: Diagnosis not present

## 2015-10-23 DIAGNOSIS — I10 Essential (primary) hypertension: Secondary | ICD-10-CM | POA: Diagnosis not present

## 2015-10-24 ENCOUNTER — Other Ambulatory Visit: Payer: Self-pay | Admitting: Neurosurgery

## 2015-10-24 DIAGNOSIS — M8448XA Pathological fracture, other site, initial encounter for fracture: Secondary | ICD-10-CM

## 2015-10-30 ENCOUNTER — Other Ambulatory Visit: Payer: Medicare Other

## 2015-11-05 DIAGNOSIS — R011 Cardiac murmur, unspecified: Secondary | ICD-10-CM | POA: Diagnosis not present

## 2015-11-05 DIAGNOSIS — Z955 Presence of coronary angioplasty implant and graft: Secondary | ICD-10-CM | POA: Diagnosis not present

## 2015-11-05 DIAGNOSIS — I208 Other forms of angina pectoris: Secondary | ICD-10-CM | POA: Diagnosis not present

## 2015-11-05 DIAGNOSIS — R0602 Shortness of breath: Secondary | ICD-10-CM | POA: Diagnosis not present

## 2015-11-05 DIAGNOSIS — Z01818 Encounter for other preprocedural examination: Secondary | ICD-10-CM | POA: Diagnosis not present

## 2015-11-24 ENCOUNTER — Telehealth: Payer: Self-pay | Admitting: Family Medicine

## 2015-11-24 NOTE — Telephone Encounter (Signed)
Please schedule patient for an appointment to discuss her symptoms and prescribe appropriate therapy

## 2015-11-25 NOTE — Telephone Encounter (Signed)
Pt was given appt for aug 23

## 2015-11-26 ENCOUNTER — Encounter: Payer: Self-pay | Admitting: Family Medicine

## 2015-11-26 ENCOUNTER — Ambulatory Visit (INDEPENDENT_AMBULATORY_CARE_PROVIDER_SITE_OTHER): Payer: Medicare Other | Admitting: Family Medicine

## 2015-11-26 VITALS — BP 130/70 | HR 80 | Temp 97.3°F | Resp 16 | Ht 62.0 in | Wt 132.8 lb

## 2015-11-26 DIAGNOSIS — I10 Essential (primary) hypertension: Secondary | ICD-10-CM | POA: Diagnosis not present

## 2015-11-26 DIAGNOSIS — M8448XS Pathological fracture, other site, sequela: Secondary | ICD-10-CM | POA: Diagnosis not present

## 2015-11-26 DIAGNOSIS — J449 Chronic obstructive pulmonary disease, unspecified: Secondary | ICD-10-CM

## 2015-11-26 MED ORDER — ALBUTEROL SULFATE HFA 108 (90 BASE) MCG/ACT IN AERS
2.0000 | INHALATION_SPRAY | Freq: Four times a day (QID) | RESPIRATORY_TRACT | 2 refills | Status: DC | PRN
Start: 2015-11-26 — End: 2016-02-02

## 2015-11-26 MED ORDER — OXYCODONE-ACETAMINOPHEN 5-325 MG PO TABS: 1.0000 | ORAL_TABLET | Freq: Three times a day (TID) | ORAL | 0 refills | Status: DC | PRN

## 2015-11-26 MED ORDER — CLONIDINE HCL 0.1 MG PO TABS: 0.1000 mg | ORAL_TABLET | Freq: Two times a day (BID) | ORAL | 0 refills | Status: DC
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
## 2015-12-30 ENCOUNTER — Ambulatory Visit: Payer: Medicare Other | Admitting: Family Medicine

## 2016-02-02 ENCOUNTER — Ambulatory Visit (INDEPENDENT_AMBULATORY_CARE_PROVIDER_SITE_OTHER): Payer: Medicare Other | Admitting: Family Medicine

## 2016-02-02 VITALS — BP 142/92 | HR 88 | Temp 97.6°F | Ht 62.25 in | Wt 127.4 lb

## 2016-02-02 DIAGNOSIS — Z0000 Encounter for general adult medical examination without abnormal findings: Secondary | ICD-10-CM | POA: Diagnosis not present

## 2016-02-02 DIAGNOSIS — Z8544 Personal history of malignant neoplasm of other female genital organs: Secondary | ICD-10-CM | POA: Diagnosis not present

## 2016-02-02 DIAGNOSIS — R739 Hyperglycemia, unspecified: Secondary | ICD-10-CM

## 2016-02-02 DIAGNOSIS — J449 Chronic obstructive pulmonary disease, unspecified: Secondary | ICD-10-CM | POA: Diagnosis not present

## 2016-02-02 DIAGNOSIS — M8448XS Pathological fracture, other site, sequela: Secondary | ICD-10-CM | POA: Diagnosis not present

## 2016-02-02 DIAGNOSIS — Z1211 Encounter for screening for malignant neoplasm of colon: Secondary | ICD-10-CM

## 2016-02-02 LAB — POCT GLYCOSYLATED HEMOGLOBIN (HGB A1C): Hemoglobin A1C: 5.3

## 2016-02-02 MED ORDER — ALBUTEROL SULFATE HFA 108 (90 BASE) MCG/ACT IN AERS: 2.0000 | INHALATION_SPRAY | Freq: Four times a day (QID) | RESPIRATORY_TRACT | 2 refills | Status: DC | PRN

## 2016-02-02 MED ORDER — OXYCODONE-ACETAMINOPHEN 5-325 MG PO TABS: 1.0000 | ORAL_TABLET | Freq: Three times a day (TID) | ORAL | 0 refills | Status: DC | PRN

## 2016-02-02 MED ORDER — GABAPENTIN 400 MG PO CAPS: 400.0000 mg | ORAL_CAPSULE | Freq: Three times a day (TID) | ORAL | 1 refills | Status: DC
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
## 2016-02-02 NOTE — Patient Instructions (Signed)
Carol Henderson , Thank you for taking time to come for your Medicare Wellness Visit. I appreciate your ongoing commitment to your health goals. Please review the following plan we discussed and let me know if I can assist you in the future.   These are the goals we discussed: Goals    . Increase water intake          Starting 02/02/16, I will increase my water intake to 3-4 glasses a day.       This is a list of the screening recommended for you and due dates:  Health Maintenance  Topic Date Due  . Flu Shot  02/01/2017*  . Pap Smear  02/01/2017*  . Tetanus Vaccine  02/01/2017*  . HIV Screening  02/01/2017*  *Topic was postponed. The date shown is not the original due date.   Preventive Care for Adults  A healthy lifestyle and preventive care can promote health and wellness. Preventive health guidelines for adults include the following key practices.  . A routine yearly physical is a good way to check with your health care provider about your health and preventive screening. It is a chance to share any concerns and updates on your health and to receive a thorough exam.  . Visit your dentist for a routine exam and preventive care every 6 months. Brush your teeth twice a day and floss once a day. Good oral hygiene prevents tooth decay and gum disease.  . The frequency of eye exams is based on your age, health, family medical history, use  of contact lenses, and other factors. Follow your health care provider's ecommendations for frequency of eye exams.  . Eat a healthy diet. Foods like vegetables, fruits, whole grains, low-fat dairy products, and lean protein foods contain the nutrients you need without too many calories. Decrease your intake of foods high in solid fats, added sugars, and salt. Eat the right amount of calories for you. Get information about a proper diet from your health care provider, if necessary.  . Regular physical exercise is one of the most important things you can  do for your health. Most adults should get at least 150 minutes of moderate-intensity exercise (any activity that increases your heart rate and causes you to sweat) each week. In addition, most adults need muscle-strengthening exercises on 2 or more days a week.  Silver Sneakers may be a benefit available to you. To determine eligibility, you may visit the website: www.silversneakers.com or contact program at 639-350-1912 Mon-Fri between 8AM-8PM.   . Maintain a healthy weight. The body mass index (BMI) is a screening tool to identify possible weight problems. It provides an estimate of body fat based on height and weight. Your health care provider can find your BMI and can help you achieve or maintain a healthy weight.   For adults 20 years and older: ? A BMI below 18.5 is considered underweight. ? A BMI of 18.5 to 24.9 is normal. ? A BMI of 25 to 29.9 is considered overweight. ? A BMI of 30 and above is considered obese.   . Maintain normal blood lipids and cholesterol levels by exercising and minimizing your intake of saturated fat. Eat a balanced diet with plenty of fruit and vegetables. Blood tests for lipids and cholesterol should begin at age 88 and be repeated every 5 years. If your lipid or cholesterol levels are high, you are over 50, or you are at high risk for heart disease, you may need your cholesterol levels  checked more frequently. Ongoing high lipid and cholesterol levels should be treated with medicines if diet and exercise are not working.  . If you smoke, find out from your health care provider how to quit. If you do not use tobacco, please do not start.  . If you choose to drink alcohol, please do not consume more than 2 drinks per day. One drink is considered to be 12 ounces (355 mL) of beer, 5 ounces (148 mL) of wine, or 1.5 ounces (44 mL) of liquor.  . If you are 71-50 years old, ask your health care provider if you should take aspirin to prevent strokes.  . Use  sunscreen. Apply sunscreen liberally and repeatedly throughout the day. You should seek shade when your shadow is shorter than you. Protect yourself by wearing long sleeves, pants, a wide-brimmed hat, and sunglasses year round, whenever you are outdoors.  . Once a month, do a whole body skin exam, using a mirror to look at the skin on your back. Tell your health care provider of new moles, moles that have irregular borders, moles that are larger than a pencil eraser, or moles that have changed in shape or color.

## 2016-02-09 ENCOUNTER — Telehealth: Payer: Self-pay | Admitting: *Deleted

## 2016-02-09 NOTE — Telephone Encounter (Signed)
Patient called and wants to schedule her colonoscopy. She is requesting a call back. Her call back number is [phone]

## 2016-02-11 NOTE — Telephone Encounter (Signed)
Called patient and left her a voicemail stating that I will be calling her on Friday to schedule her colonoscopy. I can't do it today because I am front desk. I couldn't do it yesterday or the day prior to that because I was clinical. I won't be able to do it tomorrow because I will be front desk.

## 2016-02-11 NOTE — Telephone Encounter (Signed)
Patient called again about scheduling her colonoscopy . Patient is requesting a call back. . Her number is [phone]. Please advise. Thank you

## 2016-02-12 DIAGNOSIS — R0602 Shortness of breath: Secondary | ICD-10-CM | POA: Diagnosis not present

## 2016-02-12 DIAGNOSIS — Z955 Presence of coronary angioplasty implant and graft: Secondary | ICD-10-CM | POA: Diagnosis not present

## 2016-02-12 DIAGNOSIS — Z01818 Encounter for other preprocedural examination: Secondary | ICD-10-CM | POA: Diagnosis not present

## 2016-02-12 DIAGNOSIS — I208 Other forms of angina pectoris: Secondary | ICD-10-CM | POA: Diagnosis not present

## 2016-02-12 DIAGNOSIS — R011 Cardiac murmur, unspecified: Secondary | ICD-10-CM | POA: Diagnosis not present

## 2016-02-13 ENCOUNTER — Emergency Department
Admission: EM | Admit: 2016-02-13 | Discharge: 2016-02-13 | Disposition: A | Discharge status: Home / Self Care | Payer: Medicare Other | Attending: Emergency Medicine | Admitting: Emergency Medicine

## 2016-02-13 ENCOUNTER — Emergency Department: Payer: Medicare Other

## 2016-02-13 DIAGNOSIS — Z79899 Other long term (current) drug therapy: Secondary | ICD-10-CM | POA: Diagnosis not present

## 2016-02-13 DIAGNOSIS — Z8544 Personal history of malignant neoplasm of other female genital organs: Secondary | ICD-10-CM | POA: Diagnosis not present

## 2016-02-13 DIAGNOSIS — R1013 Epigastric pain: Secondary | ICD-10-CM | POA: Insufficient documentation

## 2016-02-13 DIAGNOSIS — I2510 Atherosclerotic heart disease of native coronary artery without angina pectoris: Secondary | ICD-10-CM | POA: Diagnosis not present

## 2016-02-13 DIAGNOSIS — F17210 Nicotine dependence, cigarettes, uncomplicated: Secondary | ICD-10-CM | POA: Diagnosis not present

## 2016-02-13 DIAGNOSIS — I10 Essential (primary) hypertension: Secondary | ICD-10-CM | POA: Insufficient documentation

## 2016-02-13 DIAGNOSIS — R1011 Right upper quadrant pain: Secondary | ICD-10-CM

## 2016-02-13 DIAGNOSIS — J449 Chronic obstructive pulmonary disease, unspecified: Secondary | ICD-10-CM | POA: Diagnosis not present

## 2016-02-13 DIAGNOSIS — Z7982 Long term (current) use of aspirin: Secondary | ICD-10-CM | POA: Diagnosis not present

## 2016-02-13 DIAGNOSIS — K8020 Calculus of gallbladder without cholecystitis without obstruction: Secondary | ICD-10-CM | POA: Diagnosis not present

## 2016-02-13 DIAGNOSIS — J45909 Unspecified asthma, uncomplicated: Secondary | ICD-10-CM | POA: Insufficient documentation

## 2016-02-13 LAB — CBC
HCT: 47.4 % — ABNORMAL HIGH (ref 35.0–47.0)
Hemoglobin: 15.9 g/dL (ref 12.0–16.0)
MCH: 30.8 pg (ref 26.0–34.0)
MCHC: 33.6 g/dL (ref 32.0–36.0)
MCV: 91.6 fL (ref 80.0–100.0)
PLATELETS: 304 10*3/uL (ref 150–440)
RBC: 5.17 MIL/uL (ref 3.80–5.20)
RDW: 12.9 % (ref 11.5–14.5)
WBC: 10 10*3/uL (ref 3.6–11.0)

## 2016-02-13 LAB — TROPONIN I: Troponin I: <0.03 ng/mL (ref <0.03)

## 2016-02-13 LAB — COMPREHENSIVE METABOLIC PANEL
ALT: 19 U/L (ref 14–54)
AST: 23 U/L (ref 15–41)
Albumin: 3.7 g/dL (ref 3.5–5.0)
Alkaline Phosphatase: 78 U/L (ref 38–126)
Anion gap: 9 (ref 5–15)
BUN: 19 mg/dL (ref 6–20)
CHLORIDE: 105 mmol/L (ref 101–111)
CO2: 23 mmol/L (ref 22–32)
CREATININE: 0.89 mg/dL (ref 0.44–1.00)
Calcium: 9.8 mg/dL (ref 8.9–10.3)
GFR calc Af Amer: >60 mL/min (ref >60)
GFR calc non Af Amer: >60 mL/min (ref >60)
Glucose, Bld: 119 mg/dL — ABNORMAL HIGH (ref 65–99)
Potassium: 3.9 mmol/L (ref 3.5–5.1)
SODIUM: 137 mmol/L (ref 135–145)
Total Bilirubin: 0.2 mg/dL — ABNORMAL LOW (ref 0.3–1.2)
Total Protein: 7.6 g/dL (ref 6.5–8.1)

## 2016-02-13 LAB — LIPASE, BLOOD: LIPASE: 31 U/L (ref 11–51)

## 2016-02-13 MED ORDER — HYDROMORPHONE HCL 1 MG/ML IJ SOLN
1.0000 mg | Freq: Once | INTRAMUSCULAR | Status: AC
  Administered 2016-02-13: 1 mg via INTRAVENOUS

## 2016-02-13 MED ORDER — ONDANSETRON HCL 4 MG/2ML IJ SOLN
INTRAMUSCULAR | Status: AC
  Administered 2016-02-13: 4 mg via INTRAVENOUS
  Filled 2016-02-13: qty 2

## 2016-02-13 MED ORDER — SODIUM CHLORIDE 0.9 % IV BOLUS (SEPSIS)
1000.0000 mL | Freq: Once | INTRAVENOUS | Status: AC
  Administered 2016-02-13: 1000 mL via INTRAVENOUS

## 2016-02-13 MED ORDER — ONDANSETRON HCL 4 MG/2ML IJ SOLN
4.0000 mg | Freq: Once | INTRAMUSCULAR | Status: AC
  Administered 2016-02-13: 4 mg via INTRAVENOUS

## 2016-02-13 MED ORDER — HYDROMORPHONE HCL 1 MG/ML IJ SOLN
INTRAMUSCULAR | Status: AC
  Administered 2016-02-13: 1 mg via INTRAVENOUS
  Filled 2016-02-13: qty 1

## 2016-02-13 MED ORDER — HYDROMORPHONE HCL 1 MG/ML IJ SOLN
1.0000 mg | Freq: Once | INTRAMUSCULAR | Status: AC
  Administered 2016-02-13: 1 mg via INTRAVENOUS
  Filled 2016-02-13: qty 1

## 2016-02-13 MED ORDER — GI COCKTAIL ~~LOC~~
30.0000 mL | Freq: Once | ORAL | Status: AC
  Administered 2016-02-13: 30 mL via ORAL
  Filled 2016-02-13: qty 30

## 2016-02-13 NOTE — Telephone Encounter (Signed)
Called patient and left a voicemail 

## 2016-02-13 NOTE — Discharge Instructions (Signed)
You were evaluated for upper abdominal pain and your exam and evaluation are reassuring today in the emergency department. As we discussed, your ultrasound did show one gallstone, but no emergency evidence of blockage or infection. As we discussed, your symptoms sound like they may be due to stomach acid or ulcers/gastritis and I would recommend that you avoid fatty and spicy as well as acidic or citrus such as fruit juices and caffeine-related beverages to help the stomach healed. You may try over-the-counter Maalox or Tums for immediate relief. You may try an acid reducer for 1-2 weeks such as Prilosec over-the-counter.  As we discussed, for the gallstones, you may follow up with a general surgeon. For stomach symptoms, please make an appointment with a gastroenterologist.  Please follow up closely within the next week with your primary care physician.  Return to the emergency department for any worsening pain, black or bloody stools, vomiting blood, chest pain or trouble breathing, dizziness or passing out, or any other symptoms concerning to you.

## 2016-02-13 NOTE — ED Triage Notes (Signed)
Pt c/o epigastric pain X 4 days. Pt believes that it is her gallbladder. Denies NV.

## 2016-02-13 NOTE — ED Notes (Signed)
Patient transported to Ultrasound 
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
## 2016-02-18 ENCOUNTER — Other Ambulatory Visit: Payer: Self-pay

## 2016-02-19 NOTE — Telephone Encounter (Signed)
Gastroenterology Pre-Procedure Review  Request Date: In office appointment scheduled  Requesting Physician:   PATIENT REVIEW QUESTIONS: The patient responded to the following health history questions as indicated:    1. Are you having any GI issues? yes (Rectal bleeding & abdominal pain ) 2. Do you have a personal history of Polyps? no 3. Do you have a family history of Colon Cancer or Polyps? no 4. Diabetes Mellitus? no 5. Joint replacements in the past 12 months?no 6. Major health problems in the past 3 months?no 7. Any artificial heart valves, MVP, or defibrillator?no    MEDICATIONS & ALLERGIES:    Patient reports the following regarding taking any anticoagulation/antiplatelet therapy:   Plavix, Coumadin, Eliquis, Xarelto, Lovenox, Pradaxa, Brilinta, or Effient? no Aspirin? yes (muscle pain )  Patient confirms/reports the following medications:  Current Outpatient Prescriptions  Medication Sig Dispense Refill  . albuterol (PROAIR HFA) 108 (90 Base) MCG/ACT inhaler Inhale 2 puffs into the lungs every 6 (six) hours as needed for wheezing or shortness of breath. 18 g 2  . aspirin 81 MG tablet Take 81 mg by mouth daily.    Marland Kitchen atorvastatin (LIPITOR) 40 MG tablet Take 1 tablet (40 mg total) by mouth daily at 6 PM. 30 tablet 6  . cloNIDine (CATAPRES) 0.1 MG tablet Take 1 tablet (0.1 mg total) by mouth 2 (two) times daily. 180 tablet 0  . cloNIDine (CATAPRES) 0.1 MG tablet Take by mouth.    . gabapentin (NEURONTIN) 400 MG capsule Take 1 capsule (400 mg total) by mouth 3 (three) times daily. Reported on 07/02/2015 270 capsule 1  . oxyCODONE-acetaminophen (PERCOCET/ROXICET) 5-325 MG tablet take 1 tablet by mouth every 8 hours if needed for MODERATE PAIN  0  . pantoprazole (PROTONIX) 40 MG tablet Take 40 mg by mouth daily. Reported on 07/02/2015    . tiotropium (SPIRIVA HANDIHALER) 18 MCG inhalation capsule Place 18 mcg into inhaler and inhale daily.      No current facility-administered  medications for this visit.     Patient confirms/reports the following allergies:  No Known Allergies  No orders of the defined types were placed in this encounter.   AUTHORIZATION INFORMATION Primary Insurance: 1D#: Group #:  Secondary Insurance: 1D#: Group #:  SCHEDULE INFORMATION: Date:  Time: Location:

## 2016-03-03 ENCOUNTER — Encounter: Payer: Self-pay | Admitting: Family Medicine

## 2016-03-04 ENCOUNTER — Telehealth: Payer: Self-pay | Admitting: Family Medicine

## 2016-03-04 NOTE — Telephone Encounter (Signed)
Patient is requesting a refill on oxyCodone-acetaminophen 5-325 mg.  Patient stated that she was told by provider that she could just walk in to pick medication.  I inform patient that once Dr. Manuella Ghazi printed the rx she will be called once ready.  Please call patient once rx request is complete.

## 2016-03-04 NOTE — Telephone Encounter (Signed)
Patient had been informed that she needs to schedule a medication refill appointment and she an appointment scheduled for Monday 03/08/2016

## 2016-03-08 ENCOUNTER — Ambulatory Visit: Payer: Medicare Other | Admitting: Family Medicine

## 2016-03-08 ENCOUNTER — Ambulatory Visit: Payer: Medicare Other | Admitting: Gastroenterology

## 2016-03-09 ENCOUNTER — Encounter: Payer: Self-pay | Admitting: Family Medicine

## 2016-03-09 ENCOUNTER — Ambulatory Visit (INDEPENDENT_AMBULATORY_CARE_PROVIDER_SITE_OTHER): Payer: Medicare Other | Admitting: Family Medicine

## 2016-03-09 VITALS — BP 140/78 | HR 92 | Temp 98.1°F | Resp 17 | Ht 62.0 in | Wt 130.3 lb

## 2016-03-09 DIAGNOSIS — I10 Essential (primary) hypertension: Secondary | ICD-10-CM | POA: Diagnosis not present

## 2016-03-09 DIAGNOSIS — M8448XS Pathological fracture, other site, sequela: Secondary | ICD-10-CM

## 2016-03-09 MED ORDER — OXYCODONE-ACETAMINOPHEN 5-325 MG PO TABS: ORAL_TABLET | ORAL | 0 refills | Status: DC

## 2016-03-09 MED ORDER — CLONIDINE HCL 0.1 MG PO TABS: 0.1000 mg | ORAL_TABLET | Freq: Two times a day (BID) | ORAL | 0 refills | Status: DC
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
## 2016-04-01 ENCOUNTER — Other Ambulatory Visit: Payer: Self-pay

## 2016-04-01 ENCOUNTER — Telehealth: Payer: Self-pay

## 2016-04-01 NOTE — Telephone Encounter (Signed)
Gastroenterology Pre-Procedure Review  Request Date:  Requesting Physician: Dr.   PATIENT REVIEW QUESTIONS: The patient responded to the following health history questions as indicated:    1. Are you having any GI issues? yes (Some rectal bleeding) 2. Do you have a personal history of Polyps? no 3. Do you have a family history of Colon Cancer or Polyps? no 4. Diabetes Mellitus? no 5. Joint replacements in the past 12 months?no 6. Major health problems in the past 3 months?no 7. Any artificial heart valves, MVP, or defibrillator?yes (Stent plced 7 months ago. Cardiology stopped already for procedure per pt)    MEDICATIONS & ALLERGIES:    Patient reports the following regarding taking any anticoagulation/antiplatelet therapy:   Plavix, Coumadin, Eliquis, Xarelto, Lovenox, Pradaxa, Brilinta, or Effient? yes (Brilinta 90mg ) Aspirin? no  Patient confirms/reports the following medications:  Current Outpatient Prescriptions  Medication Sig Dispense Refill  . albuterol (PROAIR HFA) 108 (90 Base) MCG/ACT inhaler Inhale 2 puffs into the lungs every 6 (six) hours as needed for wheezing or shortness of breath. 18 g 2  . aspirin 81 MG tablet Take 81 mg by mouth daily.    Marland Kitchen atorvastatin (LIPITOR) 40 MG tablet Take 1 tablet (40 mg total) by mouth daily at 6 PM. 30 tablet 6  . cloNIDine (CATAPRES) 0.1 MG tablet Take by mouth.    . cloNIDine (CATAPRES) 0.1 MG tablet Take 1 tablet (0.1 mg total) by mouth 2 (two) times daily. 180 tablet 0  . gabapentin (NEURONTIN) 400 MG capsule Take 1 capsule (400 mg total) by mouth 3 (three) times daily. Reported on 07/02/2015 270 capsule 1  . oxyCODONE-acetaminophen (PERCOCET/ROXICET) 5-325 MG tablet take 1 tablet by mouth every 8 hours if needed for MODERATE PAIN 90 tablet 0  . pantoprazole (PROTONIX) 40 MG tablet Take 40 mg by mouth daily. Reported on 07/02/2015    . tiotropium (SPIRIVA HANDIHALER) 18 MCG inhalation capsule Place 18 mcg into inhaler and inhale daily.       No current facility-administered medications for this visit.     Patient confirms/reports the following allergies:  No Known Allergies  No orders of the defined types were placed in this encounter.   AUTHORIZATION INFORMATION Primary Insurance: 1D#: Group #:  Secondary Insurance: 1D#: Group #:  SCHEDULE INFORMATION: Date: 04/08/16 Time: Location: [place]

## 2016-04-06 ENCOUNTER — Telehealth: Payer: Self-pay

## 2016-04-06 ENCOUNTER — Encounter: Payer: Medicare Other | Admitting: Obstetrics and Gynecology

## 2016-04-06 NOTE — Telephone Encounter (Signed)
The notification/prior authorization case information was transmitted on 04/06/2016 at 10:34 AM CST. The notification/prior authorization reference number is [id number].

## 2016-04-08 ENCOUNTER — Ambulatory Visit: Admission: RE | Admit: 2016-04-08 | Payer: Medicare Other | Source: Ambulatory Visit | Admitting: Gastroenterology

## 2016-04-08 ENCOUNTER — Encounter: Admission: RE | Payer: Self-pay | Source: Ambulatory Visit

## 2016-04-08 SURGERY — COLONOSCOPY WITH PROPOFOL
Anesthesia: General

## 2016-04-14 ENCOUNTER — Telehealth: Payer: Self-pay | Admitting: Gastroenterology

## 2016-04-14 ENCOUNTER — Other Ambulatory Visit: Payer: Self-pay

## 2016-04-14 NOTE — Telephone Encounter (Signed)
Pt rescheduled for a screening colonoscopy at [facility] on 04/22/16. Please precert.   Originally scheduled on [id number] and precert was done.

## 2016-04-14 NOTE — Telephone Encounter (Signed)
Patient is ready to reschedule her colonoscopy

## 2016-04-16 NOTE — Telephone Encounter (Signed)
The notification/prior authorization case information was transmitted on 04/16/2016 at 9:19 AM CST. The notification/prior authorization reference number is [id number]

## 2016-04-28 ENCOUNTER — Encounter: Payer: Self-pay | Admitting: *Deleted

## 2016-04-29 ENCOUNTER — Ambulatory Visit: Payer: Medicare Other | Admitting: Anesthesiology

## 2016-04-29 ENCOUNTER — Encounter: Payer: Self-pay | Admitting: *Deleted

## 2016-04-29 ENCOUNTER — Ambulatory Visit
Admission: RE | Admit: 2016-04-29 | Discharge: 2016-04-29 | Disposition: A | Discharge status: Home / Self Care | Payer: Medicare Other | Source: Ambulatory Visit | Attending: Gastroenterology | Admitting: Gastroenterology

## 2016-04-29 ENCOUNTER — Encounter
Admission: RE | Disposition: A | Discharge status: Home / Self Care | Payer: Self-pay | Source: Ambulatory Visit | Attending: Gastroenterology

## 2016-04-29 DIAGNOSIS — F17210 Nicotine dependence, cigarettes, uncomplicated: Secondary | ICD-10-CM | POA: Diagnosis not present

## 2016-04-29 DIAGNOSIS — D122 Benign neoplasm of ascending colon: Secondary | ICD-10-CM | POA: Insufficient documentation

## 2016-04-29 DIAGNOSIS — Z801 Family history of malignant neoplasm of trachea, bronchus and lung: Secondary | ICD-10-CM | POA: Diagnosis not present

## 2016-04-29 DIAGNOSIS — B182 Chronic viral hepatitis C: Secondary | ICD-10-CM | POA: Diagnosis not present

## 2016-04-29 DIAGNOSIS — Z803 Family history of malignant neoplasm of breast: Secondary | ICD-10-CM | POA: Insufficient documentation

## 2016-04-29 DIAGNOSIS — Z79899 Other long term (current) drug therapy: Secondary | ICD-10-CM | POA: Diagnosis not present

## 2016-04-29 DIAGNOSIS — Z955 Presence of coronary angioplasty implant and graft: Secondary | ICD-10-CM | POA: Insufficient documentation

## 2016-04-29 DIAGNOSIS — K640 First degree hemorrhoids: Secondary | ICD-10-CM | POA: Insufficient documentation

## 2016-04-29 DIAGNOSIS — Z9221 Personal history of antineoplastic chemotherapy: Secondary | ICD-10-CM | POA: Insufficient documentation

## 2016-04-29 DIAGNOSIS — Z8249 Family history of ischemic heart disease and other diseases of the circulatory system: Secondary | ICD-10-CM | POA: Insufficient documentation

## 2016-04-29 DIAGNOSIS — Z923 Personal history of irradiation: Secondary | ICD-10-CM | POA: Insufficient documentation

## 2016-04-29 DIAGNOSIS — Z7982 Long term (current) use of aspirin: Secondary | ICD-10-CM | POA: Insufficient documentation

## 2016-04-29 DIAGNOSIS — J449 Chronic obstructive pulmonary disease, unspecified: Secondary | ICD-10-CM | POA: Diagnosis not present

## 2016-04-29 DIAGNOSIS — Z1211 Encounter for screening for malignant neoplasm of colon: Secondary | ICD-10-CM | POA: Diagnosis not present

## 2016-04-29 DIAGNOSIS — K635 Polyp of colon: Secondary | ICD-10-CM | POA: Diagnosis not present

## 2016-04-29 DIAGNOSIS — K219 Gastro-esophageal reflux disease without esophagitis: Secondary | ICD-10-CM | POA: Insufficient documentation

## 2016-04-29 DIAGNOSIS — K649 Unspecified hemorrhoids: Secondary | ICD-10-CM | POA: Diagnosis not present

## 2016-04-29 DIAGNOSIS — Z8544 Personal history of malignant neoplasm of other female genital organs: Secondary | ICD-10-CM | POA: Insufficient documentation

## 2016-04-29 DIAGNOSIS — I10 Essential (primary) hypertension: Secondary | ICD-10-CM | POA: Diagnosis not present

## 2016-04-29 DIAGNOSIS — I252 Old myocardial infarction: Secondary | ICD-10-CM | POA: Insufficient documentation

## 2016-04-29 HISTORY — PX: COLONOSCOPY WITH PROPOFOL: SHX5780

## 2016-04-29 LAB — POCT PREGNANCY, URINE: PREG TEST UR: NEGATIVE

## 2016-04-29 SURGERY — COLONOSCOPY WITH PROPOFOL
Anesthesia: General

## 2016-04-29 MED ORDER — MIDAZOLAM HCL 2 MG/2ML IJ SOLN
INTRAMUSCULAR | Status: DC | PRN
  Administered 2016-04-29 (×2): 1 mg via INTRAVENOUS

## 2016-04-29 MED ORDER — FENTANYL CITRATE (PF) 100 MCG/2ML IJ SOLN
INTRAMUSCULAR | Status: DC | PRN
  Administered 2016-04-29: 50 ug via INTRAVENOUS
  Administered 2016-04-29 (×2): 25 ug via INTRAVENOUS

## 2016-04-29 MED ORDER — FENTANYL CITRATE (PF) 100 MCG/2ML IJ SOLN
INTRAMUSCULAR | Status: AC
Start: 2016-04-29 — End: 2016-04-29
  Filled 2016-04-29: qty 2

## 2016-04-29 MED ORDER — PROPOFOL 500 MG/50ML IV EMUL
INTRAVENOUS | Status: AC
  Filled 2016-04-29: qty 50

## 2016-04-29 MED ORDER — MIDAZOLAM HCL 2 MG/2ML IJ SOLN
INTRAMUSCULAR | Status: AC
  Filled 2016-04-29: qty 2

## 2016-04-29 MED ORDER — PROPOFOL 500 MG/50ML IV EMUL
INTRAVENOUS | Status: DC | PRN
  Administered 2016-04-29: 120 ug/kg/min via INTRAVENOUS

## 2016-04-29 MED ORDER — SODIUM CHLORIDE 0.9 % IV SOLN
INTRAVENOUS | Status: DC
  Administered 2016-04-29: 10:00:00 via INTRAVENOUS
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
## 2016-04-29 NOTE — Transfer of Care (Signed)
Immediate Anesthesia Transfer of Care Note  Patient: Carol Henderson  Procedure(s) Performed: Procedure(s): COLONOSCOPY WITH PROPOFOL (N/A)  Patient Location: PACU  Anesthesia Type:General  Level of Consciousness: awake, alert  and sedated  Airway & Oxygen Therapy: Patient Spontanous Breathing and Patient connected to nasal cannula oxygen  Post-op Assessment: Report given to RN and Post -op Vital signs reviewed and stable  Post vital signs: Reviewed and stable  Last Vitals:  Vitals:   04/29/16 0934  BP: (!) 159/67  Pulse: 70  Resp: 16  Temp: 36.5 C    Last Pain:  Vitals:   04/29/16 0934  TempSrc: Tympanic         Complications: No apparent anesthesia complications

## 2016-04-29 NOTE — Anesthesia Post-op Follow-up Note (Cosign Needed)
Anesthesia QCDR form completed.        

## 2016-04-29 NOTE — Op Note (Signed)
[facility] Gastroenterology Patient Name: Carol Henderson Procedure Date: 04/29/2016 10:14 AM MRN: [id number] Account #: [id number] Date of Birth: 1965/07/19 Admit Type: Outpatient Age: 51 Room: [facility] ENDO ROOM 4 Gender: Female Note Status: Finalized Procedure:            Colonoscopy Indications:          Screening for colorectal malignant neoplasm Providers:            Jonathon Bellows MD, MD Referring MD:         Otila Back. Manuella Ghazi (Referring MD) Medicines:            Monitored Anesthesia Care Complications:        No immediate complications. Procedure:            Pre-Anesthesia Assessment:                       - Prior to the procedure, a History and Physical was                        performed, and patient medications, allergies and                        sensitivities were reviewed. The patient's tolerance of                        previous anesthesia was reviewed.                       - The risks and benefits of the procedure and the                        sedation options and risks were discussed with the                        patient. All questions were answered and informed                        consent was obtained.                       - The risks and benefits of the procedure and the                        sedation options and risks were discussed with the                        patient. All questions were answered and informed                        consent was obtained.                       - ASA Grade Assessment: III - A patient with severe                        systemic disease.                       After obtaining informed consent, the colonoscope was  passed under direct vision. Throughout the procedure,                        the patient's blood pressure, pulse, and oxygen                        saturations were monitored continuously. The                        Colonoscope was introduced through the anus and       advanced to the the cecum, identified by the                        appendiceal orifice, IC valve and transillumination.                        The colonoscopy was performed with ease. The patient                        tolerated the procedure well. The quality of the bowel                        preparation was adequate. Findings:      Non-bleeding internal hemorrhoids were found during retroflexion. The       hemorrhoids were medium-sized and Grade I (internal hemorrhoids that do       not prolapse).      A 5 mm polyp was found in the ascending colon. The polyp was sessile.       The polyp was removed with a cold biopsy forceps. Resection and       retrieval were complete. Biopsies were taken with a cold forceps for       histology.      The exam was otherwise without abnormality. Impression:           - Non-bleeding internal hemorrhoids.                       - One 5 mm polyp in the ascending colon, removed with a                        cold biopsy forceps. Resected and retrieved. Biopsied.                       - The examination was otherwise normal. Recommendation:       - Discharge patient to home (with escort).                       - Resume previous diet.                       - Repeat colonoscopy in 5-10 years for surveillance                        based on pathology results. Procedure Code(s):    --- Professional ---                       [phone], Colonoscopy, flexible; with biopsy, single or  multiple Diagnosis Code(s):    --- Professional ---                       Z12.11, Encounter for screening for malignant neoplasm                        of colon                       D12.2, Benign neoplasm of ascending colon                       K64.0, First degree hemorrhoids CPT copyright 2016 American Medical Association. All rights reserved. The codes documented in this report are preliminary and upon coder review may  be revised to meet current compliance  requirements. Jonathon Bellows, MD Jonathon Bellows MD, MD 04/29/2016 10:46:45 AM This report has been signed electronically. Number of Addenda: 0 Note Initiated On: 04/29/2016 10:14 AM Scope Withdrawal Time: 0 hours 15 minutes 9 seconds  Total Procedure Duration: 0 hours 21 minutes 5 seconds       [facility]

## 2016-04-29 NOTE — Anesthesia Procedure Notes (Signed)
Performed by: COOK-MARTIN, Ajmal Kathan Pre-anesthesia Checklist: Patient identified, Emergency Drugs available, Suction available, Patient being monitored and Timeout performed Patient Re-evaluated:Patient Re-evaluated prior to inductionOxygen Delivery Method: Nasal cannula Preoxygenation: Pre-oxygenation with 100% oxygen Intubation Type: IV induction Placement Confirmation: CO2 detector and positive ETCO2       
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
## 2016-04-29 NOTE — Anesthesia Postprocedure Evaluation (Signed)
Anesthesia Post Note  Patient: Carol Henderson  Procedure(s) Performed: Procedure(s) (LRB): COLONOSCOPY WITH PROPOFOL (N/A)  Patient location during evaluation: Endoscopy Anesthesia Type: General Level of consciousness: awake and alert Pain management: pain level controlled Vital Signs Assessment: post-procedure vital signs reviewed and stable Respiratory status: spontaneous breathing, nonlabored ventilation, respiratory function stable and patient connected to nasal cannula oxygen Cardiovascular status: blood pressure returned to baseline and stable Postop Assessment: no signs of nausea or vomiting Anesthetic complications: no     Last Vitals:  Vitals:   04/29/16 1108 04/29/16 1118  BP: (!) 188/116 (!) 189/108  Pulse: (!) 58 62  Resp: 12 15  Temp:      Last Pain:  Vitals:   04/29/16 1048  TempSrc: Tympanic                 Precious Haws Deanna Boehlke

## 2016-04-30 ENCOUNTER — Encounter: Payer: Self-pay | Admitting: Gastroenterology

## 2016-04-30 LAB — SURGICAL PATHOLOGY

## 2016-05-03 ENCOUNTER — Encounter: Payer: Self-pay | Admitting: Gastroenterology

## 2016-05-05 ENCOUNTER — Ambulatory Visit (INDEPENDENT_AMBULATORY_CARE_PROVIDER_SITE_OTHER): Payer: Medicare Other | Admitting: Family Medicine

## 2016-05-05 ENCOUNTER — Ambulatory Visit
Admission: RE | Admit: 2016-05-05 | Discharge: 2016-05-05 | Disposition: A | Discharge status: Home / Self Care | Payer: Medicare Other | Source: Ambulatory Visit | Attending: Family Medicine | Admitting: Family Medicine

## 2016-05-05 ENCOUNTER — Encounter: Payer: Self-pay | Admitting: Family Medicine

## 2016-05-05 ENCOUNTER — Encounter: Payer: Medicare Other | Admitting: Obstetrics and Gynecology

## 2016-05-05 VITALS — BP 122/70 | HR 83 | Temp 97.5°F | Resp 16 | Ht 62.0 in | Wt 135.5 lb

## 2016-05-05 DIAGNOSIS — J449 Chronic obstructive pulmonary disease, unspecified: Secondary | ICD-10-CM

## 2016-05-05 DIAGNOSIS — M8448XS Pathological fracture, other site, sequela: Secondary | ICD-10-CM | POA: Diagnosis not present

## 2016-05-05 DIAGNOSIS — M25531 Pain in right wrist: Secondary | ICD-10-CM

## 2016-05-05 DIAGNOSIS — M1811 Unilateral primary osteoarthritis of first carpometacarpal joint, right hand: Secondary | ICD-10-CM | POA: Insufficient documentation

## 2016-05-05 DIAGNOSIS — S6991XA Unspecified injury of right wrist, hand and finger(s), initial encounter: Secondary | ICD-10-CM | POA: Diagnosis not present

## 2016-05-05 DIAGNOSIS — I10 Essential (primary) hypertension: Secondary | ICD-10-CM

## 2016-05-05 DIAGNOSIS — Z8781 Personal history of (healed) traumatic fracture: Secondary | ICD-10-CM | POA: Insufficient documentation

## 2016-05-05 MED ORDER — ALBUTEROL SULFATE HFA 108 (90 BASE) MCG/ACT IN AERS: 2.0000 | INHALATION_SPRAY | Freq: Four times a day (QID) | RESPIRATORY_TRACT | 2 refills | Status: DC | PRN

## 2016-05-05 MED ORDER — CLONIDINE HCL 0.1 MG PO TABS: 0.1000 mg | ORAL_TABLET | Freq: Two times a day (BID) | ORAL | 0 refills | Status: DC

## 2016-05-05 MED ORDER — OXYCODONE-ACETAMINOPHEN 5-325 MG PO TABS: ORAL_TABLET | ORAL | 0 refills | Status: DC

## 2016-05-05 MED ORDER — TIOTROPIUM BROMIDE MONOHYDRATE 18 MCG IN CAPS: 18.0000 ug | ORAL_CAPSULE | Freq: Every day | RESPIRATORY_TRACT | 0 refills | Status: DC
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
## 2016-05-27 DIAGNOSIS — R0602 Shortness of breath: Secondary | ICD-10-CM | POA: Diagnosis not present

## 2016-05-27 DIAGNOSIS — Z955 Presence of coronary angioplasty implant and graft: Secondary | ICD-10-CM | POA: Diagnosis not present

## 2016-05-27 DIAGNOSIS — I208 Other forms of angina pectoris: Secondary | ICD-10-CM | POA: Diagnosis not present

## 2016-05-27 DIAGNOSIS — I2510 Atherosclerotic heart disease of native coronary artery without angina pectoris: Secondary | ICD-10-CM | POA: Diagnosis not present

## 2016-05-27 DIAGNOSIS — R011 Cardiac murmur, unspecified: Secondary | ICD-10-CM | POA: Diagnosis not present

## 2016-05-31 ENCOUNTER — Encounter
Admission: RE | Disposition: A | Discharge status: Home / Self Care | Payer: Self-pay | Source: Ambulatory Visit | Attending: Internal Medicine

## 2016-05-31 ENCOUNTER — Ambulatory Visit
Admission: RE | Admit: 2016-05-31 | Discharge: 2016-06-01 | Disposition: A | Discharge status: Home / Self Care | Payer: Medicare Other | Source: Ambulatory Visit | Attending: Internal Medicine | Admitting: Internal Medicine

## 2016-05-31 DIAGNOSIS — E785 Hyperlipidemia, unspecified: Secondary | ICD-10-CM | POA: Diagnosis not present

## 2016-05-31 DIAGNOSIS — Z803 Family history of malignant neoplasm of breast: Secondary | ICD-10-CM | POA: Diagnosis not present

## 2016-05-31 DIAGNOSIS — F17200 Nicotine dependence, unspecified, uncomplicated: Secondary | ICD-10-CM | POA: Diagnosis not present

## 2016-05-31 DIAGNOSIS — Z8049 Family history of malignant neoplasm of other genital organs: Secondary | ICD-10-CM | POA: Diagnosis not present

## 2016-05-31 DIAGNOSIS — I25110 Atherosclerotic heart disease of native coronary artery with unstable angina pectoris: Secondary | ICD-10-CM | POA: Diagnosis not present

## 2016-05-31 DIAGNOSIS — J42 Unspecified chronic bronchitis: Secondary | ICD-10-CM | POA: Diagnosis not present

## 2016-05-31 DIAGNOSIS — R011 Cardiac murmur, unspecified: Secondary | ICD-10-CM | POA: Diagnosis not present

## 2016-05-31 DIAGNOSIS — F319 Bipolar disorder, unspecified: Secondary | ICD-10-CM | POA: Diagnosis not present

## 2016-05-31 DIAGNOSIS — J449 Chronic obstructive pulmonary disease, unspecified: Secondary | ICD-10-CM | POA: Insufficient documentation

## 2016-05-31 DIAGNOSIS — K219 Gastro-esophageal reflux disease without esophagitis: Secondary | ICD-10-CM | POA: Diagnosis not present

## 2016-05-31 DIAGNOSIS — Z801 Family history of malignant neoplasm of trachea, bronchus and lung: Secondary | ICD-10-CM | POA: Insufficient documentation

## 2016-05-31 DIAGNOSIS — J45909 Unspecified asthma, uncomplicated: Secondary | ICD-10-CM | POA: Diagnosis not present

## 2016-05-31 DIAGNOSIS — Z8249 Family history of ischemic heart disease and other diseases of the circulatory system: Secondary | ICD-10-CM | POA: Diagnosis not present

## 2016-05-31 DIAGNOSIS — Z955 Presence of coronary angioplasty implant and graft: Secondary | ICD-10-CM

## 2016-05-31 DIAGNOSIS — Z8619 Personal history of other infectious and parasitic diseases: Secondary | ICD-10-CM | POA: Diagnosis not present

## 2016-05-31 DIAGNOSIS — Z859 Personal history of malignant neoplasm, unspecified: Secondary | ICD-10-CM | POA: Diagnosis not present

## 2016-05-31 DIAGNOSIS — Z79899 Other long term (current) drug therapy: Secondary | ICD-10-CM | POA: Insufficient documentation

## 2016-05-31 HISTORY — PX: LEFT HEART CATH AND CORONARY ANGIOGRAPHY: CATH118249

## 2016-05-31 LAB — POCT ACTIVATED CLOTTING TIME: Activated Clotting Time: 279 seconds

## 2016-05-31 LAB — CARDIAC CATHETERIZATION: Cath EF Quantitative: 50 %

## 2016-05-31 SURGERY — LEFT HEART CATH AND CORONARY ANGIOGRAPHY
Anesthesia: Moderate Sedation

## 2016-05-31 SURGERY — LEFT HEART CATH AND CORONARY ANGIOGRAPHY
Anesthesia: Moderate Sedation | Laterality: Left

## 2016-05-31 MED ORDER — BIVALIRUDIN BOLUS VIA INFUSION - CUPID
INTRAVENOUS | Status: DC | PRN
  Administered 2016-05-31: 43.2 mg via INTRAVENOUS

## 2016-05-31 MED ORDER — IOPAMIDOL (ISOVUE-300) INJECTION 61%
INTRAVENOUS | Status: DC | PRN
  Administered 2016-05-31: 415 mL via INTRA_ARTERIAL

## 2016-05-31 MED ORDER — MIDAZOLAM HCL 2 MG/2ML IJ SOLN
INTRAMUSCULAR | Status: AC
  Filled 2016-05-31: qty 2

## 2016-05-31 MED ORDER — SODIUM CHLORIDE 0.9 % IV SOLN
0.2500 mg/kg/h | INTRAVENOUS | Status: AC
  Filled 2016-05-31: qty 250

## 2016-05-31 MED ORDER — NITROGLYCERIN 0.4 MG SL SUBL
SUBLINGUAL_TABLET | SUBLINGUAL | Status: AC
  Filled 2016-05-31: qty 1

## 2016-05-31 MED ORDER — SODIUM CHLORIDE 0.9% FLUSH: 3.0000 mL | INTRAVENOUS | Status: DC | PRN

## 2016-05-31 MED ORDER — SODIUM CHLORIDE 0.9 % WEIGHT BASED INFUSION: 1.0000 mL/kg/h | INTRAVENOUS | Status: DC

## 2016-05-31 MED ORDER — NITROGLYCERIN 1 MG/10 ML FOR IR/CATH LAB
INTRA_ARTERIAL | Status: DC | PRN
Start: 2016-05-31 — End: 2016-05-31
  Administered 2016-05-31 (×3): 200 ug via INTRACORONARY

## 2016-05-31 MED ORDER — FENTANYL CITRATE (PF) 100 MCG/2ML IJ SOLN
INTRAMUSCULAR | Status: AC
  Filled 2016-05-31: qty 2

## 2016-05-31 MED ORDER — TICAGRELOR 90 MG PO TABS
ORAL_TABLET | ORAL | Status: DC | PRN
  Administered 2016-05-31: 180 mg via ORAL

## 2016-05-31 MED ORDER — HEPARIN (PORCINE) IN NACL 2-0.9 UNIT/ML-% IJ SOLN
INTRAMUSCULAR | Status: AC
  Filled 2016-05-31: qty 500

## 2016-05-31 MED ORDER — ASPIRIN 81 MG PO CHEW
CHEWABLE_TABLET | ORAL | Status: DC | PRN
  Administered 2016-05-31: 324 mg via ORAL

## 2016-05-31 MED ORDER — TICAGRELOR 90 MG PO TABS
90.0000 mg | ORAL_TABLET | Freq: Two times a day (BID) | ORAL | Status: DC
  Administered 2016-06-01: 90 mg via ORAL
  Filled 2016-05-31: qty 1

## 2016-05-31 MED ORDER — MIDAZOLAM HCL 2 MG/2ML IJ SOLN
INTRAMUSCULAR | Status: DC | PRN
  Administered 2016-05-31: 0.5 mg via INTRAVENOUS
  Administered 2016-05-31: 1 mg via INTRAVENOUS
  Administered 2016-05-31: 0.5 mg via INTRAVENOUS

## 2016-05-31 MED ORDER — SODIUM CHLORIDE 0.9% FLUSH: 3.0000 mL | Freq: Two times a day (BID) | INTRAVENOUS | Status: DC

## 2016-05-31 MED ORDER — SODIUM CHLORIDE 0.9 % WEIGHT BASED INFUSION: 1.0000 mL/kg/h | INTRAVENOUS | Status: AC

## 2016-05-31 MED ORDER — FENTANYL CITRATE (PF) 100 MCG/2ML IJ SOLN
INTRAMUSCULAR | Status: DC | PRN
  Administered 2016-05-31: 25 ug via INTRAVENOUS
  Administered 2016-05-31 (×2): 50 ug via INTRAVENOUS
  Administered 2016-05-31: 25 ug via INTRAVENOUS
  Administered 2016-05-31: 50 ug via INTRAVENOUS

## 2016-05-31 MED ORDER — ASPIRIN 81 MG PO CHEW: 81.0000 mg | CHEWABLE_TABLET | ORAL | Status: DC

## 2016-05-31 MED ORDER — SODIUM CHLORIDE 0.9 % WEIGHT BASED INFUSION
3.0000 mL/kg/h | INTRAVENOUS | Status: DC
  Administered 2016-05-31: 3 mL/kg/h via INTRAVENOUS

## 2016-05-31 MED ORDER — NITROGLYCERIN 0.4 MG SL SUBL
SUBLINGUAL_TABLET | SUBLINGUAL | Status: DC | PRN
  Administered 2016-05-31: .4 mg via SUBLINGUAL

## 2016-05-31 MED ORDER — ASPIRIN 81 MG PO CHEW
CHEWABLE_TABLET | ORAL | Status: AC
  Filled 2016-05-31: qty 4

## 2016-05-31 MED ORDER — TICAGRELOR 90 MG PO TABS
ORAL_TABLET | ORAL | Status: AC
  Filled 2016-05-31: qty 2

## 2016-05-31 MED ORDER — LABETALOL HCL 5 MG/ML IV SOLN
INTRAVENOUS | Status: DC | PRN
  Administered 2016-05-31: 20 mg via INTRAVENOUS

## 2016-05-31 MED ORDER — DIAZEPAM 2 MG PO TABS
5.0000 mg | ORAL_TABLET | Freq: Four times a day (QID) | ORAL | Status: DC | PRN
  Administered 2016-05-31 – 2016-06-01 (×2): 5 mg via ORAL
  Filled 2016-05-31: qty 3

## 2016-05-31 MED ORDER — ONDANSETRON HCL 4 MG/2ML IJ SOLN: 4.0000 mg | Freq: Four times a day (QID) | INTRAMUSCULAR | Status: DC | PRN

## 2016-05-31 MED ORDER — ASPIRIN 81 MG PO CHEW
81.0000 mg | CHEWABLE_TABLET | Freq: Every day | ORAL | Status: DC
  Administered 2016-06-01: 81 mg via ORAL
  Filled 2016-05-31: qty 1

## 2016-05-31 MED ORDER — SODIUM CHLORIDE 0.9 % IV SOLN
INTRAVENOUS | Status: DC | PRN
  Administered 2016-05-31: 1.75 mg/kg/h via INTRAVENOUS

## 2016-05-31 MED ORDER — HYDRALAZINE HCL 20 MG/ML IJ SOLN
5.0000 mg | INTRAMUSCULAR | Status: AC | PRN
Start: 2016-05-31 — End: 2016-05-31

## 2016-05-31 MED ORDER — OXYCODONE-ACETAMINOPHEN 5-325 MG PO TABS
1.0000 | ORAL_TABLET | ORAL | Status: DC | PRN
  Administered 2016-05-31: 2 via ORAL
  Administered 2016-05-31: 1 via ORAL
  Administered 2016-06-01 (×3): 2 via ORAL
  Filled 2016-05-31 (×4): qty 2

## 2016-05-31 MED ORDER — NITROGLYCERIN 5 MG/ML IV SOLN
INTRAVENOUS | Status: AC
  Filled 2016-05-31: qty 10

## 2016-05-31 MED ORDER — BIVALIRUDIN 250 MG IV SOLR
INTRAVENOUS | Status: AC
  Filled 2016-05-31: qty 250

## 2016-05-31 MED ORDER — SODIUM CHLORIDE 0.9 % IV SOLN: 250.0000 mL | INTRAVENOUS | Status: DC | PRN

## 2016-05-31 MED ORDER — LABETALOL HCL 5 MG/ML IV SOLN
INTRAVENOUS | Status: AC
  Filled 2016-05-31: qty 4

## 2016-05-31 MED ORDER — MORPHINE SULFATE (PF) 2 MG/ML IV SOLN: 4.0000 mg | INTRAVENOUS | Status: DC | PRN

## 2016-05-31 MED ORDER — LABETALOL HCL 5 MG/ML IV SOLN: 10.0000 mg | INTRAVENOUS | Status: AC | PRN

## 2016-05-31 MED ORDER — OXYCODONE-ACETAMINOPHEN 5-325 MG PO TABS
ORAL_TABLET | ORAL | Status: AC
  Administered 2016-05-31: 1 via ORAL
  Filled 2016-05-31: qty 1

## 2016-05-31 MED ORDER — ACETAMINOPHEN 325 MG PO TABS: 650.0000 mg | ORAL_TABLET | ORAL | Status: DC | PRN

## 2016-05-31 MED ORDER — SODIUM CHLORIDE 0.9% FLUSH
3.0000 mL | Freq: Two times a day (BID) | INTRAVENOUS | Status: DC
  Administered 2016-06-01 (×2): 3 mL via INTRAVENOUS

## 2016-05-31 SURGICAL SUPPLY — 19 items
BALLN TREK RX 2.5X12 (BALLOONS) ×3
BALLOON TREK RX 2.5X12 (BALLOONS) IMPLANT
CATH 5FR JL4 DIAGNOSTIC (CATHETERS) ×1 IMPLANT
CATH 5FR JR4 DIAGNOSTIC (CATHETERS) ×2 IMPLANT
CATH INFINITI 5FR ANG PIGTAIL (CATHETERS) ×2 IMPLANT
CATH VISTA GUIDE 6FR JR4 SH (CATHETERS) ×2 IMPLANT
CATH VISTA GUIDE 6FR XB3 SH (CATHETERS) ×2 IMPLANT
DEVICE CLOSURE MYNXGRIP 6/7F (Vascular Products) ×2 IMPLANT
DEVICE INFLAT 30 PLUS (MISCELLANEOUS) ×2 IMPLANT
KIT MANI 3VAL PERCEP (MISCELLANEOUS) ×3 IMPLANT
NDL PERC 18GX7CM (NEEDLE) IMPLANT
NEEDLE PERC 18GX7CM (NEEDLE) ×3 IMPLANT
PACK CARDIAC CATH (CUSTOM PROCEDURE TRAY) ×3 IMPLANT
SHEATH AVANTI 5FR X 11CM (SHEATH) ×2 IMPLANT
SHEATH AVANTI 6FR X 11CM (SHEATH) ×2 IMPLANT
STENT XIENCE ALPINE RX 2.75X15 (Permanent Stent) ×2 IMPLANT
STENT XIENCE ALPINE RX 3.0X18 (Permanent Stent) ×2 IMPLANT
WIRE EMERALD 3MM-J .035X150CM (WIRE) ×4 IMPLANT
WIRE G HI TQ BMW 190 (WIRE) ×4 IMPLANT

## 2016-06-01 ENCOUNTER — Encounter: Payer: Self-pay | Admitting: Internal Medicine

## 2016-06-01 DIAGNOSIS — Z79899 Other long term (current) drug therapy: Secondary | ICD-10-CM | POA: Diagnosis not present

## 2016-06-01 DIAGNOSIS — K219 Gastro-esophageal reflux disease without esophagitis: Secondary | ICD-10-CM | POA: Diagnosis not present

## 2016-06-01 DIAGNOSIS — Z859 Personal history of malignant neoplasm, unspecified: Secondary | ICD-10-CM | POA: Diagnosis not present

## 2016-06-01 DIAGNOSIS — Z8249 Family history of ischemic heart disease and other diseases of the circulatory system: Secondary | ICD-10-CM | POA: Diagnosis not present

## 2016-06-01 DIAGNOSIS — E785 Hyperlipidemia, unspecified: Secondary | ICD-10-CM | POA: Diagnosis not present

## 2016-06-01 DIAGNOSIS — J42 Unspecified chronic bronchitis: Secondary | ICD-10-CM | POA: Diagnosis not present

## 2016-06-01 DIAGNOSIS — Z801 Family history of malignant neoplasm of trachea, bronchus and lung: Secondary | ICD-10-CM | POA: Diagnosis not present

## 2016-06-01 DIAGNOSIS — I25110 Atherosclerotic heart disease of native coronary artery with unstable angina pectoris: Secondary | ICD-10-CM | POA: Diagnosis not present

## 2016-06-01 DIAGNOSIS — R011 Cardiac murmur, unspecified: Secondary | ICD-10-CM | POA: Diagnosis not present

## 2016-06-01 DIAGNOSIS — J449 Chronic obstructive pulmonary disease, unspecified: Secondary | ICD-10-CM | POA: Diagnosis not present

## 2016-06-01 DIAGNOSIS — Z8049 Family history of malignant neoplasm of other genital organs: Secondary | ICD-10-CM | POA: Diagnosis not present

## 2016-06-01 DIAGNOSIS — Z803 Family history of malignant neoplasm of breast: Secondary | ICD-10-CM | POA: Diagnosis not present

## 2016-06-01 DIAGNOSIS — Z8619 Personal history of other infectious and parasitic diseases: Secondary | ICD-10-CM | POA: Diagnosis not present

## 2016-06-01 DIAGNOSIS — J45909 Unspecified asthma, uncomplicated: Secondary | ICD-10-CM | POA: Diagnosis not present

## 2016-06-01 MED ORDER — LABETALOL HCL 200 MG PO TABS
200.0000 mg | ORAL_TABLET | Freq: Once | ORAL | Status: AC
  Administered 2016-06-01: 200 mg via ORAL
  Filled 2016-06-01: qty 1

## 2016-06-01 NOTE — Progress Notes (Signed)
Patient is discharge home in a stable condition, site care given , summary and f/u care given , left with a friend

## 2016-06-01 NOTE — Progress Notes (Signed)
Patient  bp high [facility] made aware and order  medication to be given, will continue to monitor

## 2016-06-01 NOTE — Discharge Instructions (Signed)
Continue meds post op. Maintain Brillinta/ASA for at least 12 months Advoid tobacco abuse No heavy lifting F/U with cardiology 1-2 weeks

## 2016-06-01 NOTE — Care Management (Signed)
Patient was on brilinta prior to this procedure.  has Medicare/medicaid.

## 2016-06-02 ENCOUNTER — Encounter: Payer: Self-pay | Admitting: Family Medicine

## 2016-06-02 ENCOUNTER — Ambulatory Visit (INDEPENDENT_AMBULATORY_CARE_PROVIDER_SITE_OTHER): Payer: Medicare Other | Admitting: Family Medicine

## 2016-06-02 VITALS — BP 120/69 | HR 94 | Temp 98.8°F | Resp 17 | Ht 62.0 in | Wt 135.0 lb

## 2016-06-02 DIAGNOSIS — E785 Hyperlipidemia, unspecified: Secondary | ICD-10-CM | POA: Diagnosis not present

## 2016-06-02 DIAGNOSIS — G63 Polyneuropathy in diseases classified elsewhere: Secondary | ICD-10-CM

## 2016-06-02 DIAGNOSIS — M5416 Radiculopathy, lumbar region: Secondary | ICD-10-CM

## 2016-06-02 DIAGNOSIS — J449 Chronic obstructive pulmonary disease, unspecified: Secondary | ICD-10-CM | POA: Diagnosis not present

## 2016-06-02 DIAGNOSIS — I10 Essential (primary) hypertension: Secondary | ICD-10-CM

## 2016-06-02 DIAGNOSIS — C801 Malignant (primary) neoplasm, unspecified: Secondary | ICD-10-CM

## 2016-06-02 DIAGNOSIS — G8929 Other chronic pain: Secondary | ICD-10-CM | POA: Diagnosis not present

## 2016-06-02 LAB — COMPLETE METABOLIC PANEL WITH GFR
ALT: 17 U/L (ref 6–29)
AST: 19 U/L (ref 10–35)
Albumin: 3.6 g/dL (ref 3.6–5.1)
Alkaline Phosphatase: 84 U/L (ref 33–130)
BUN: 18 mg/dL (ref 7–25)
CHLORIDE: 103 mmol/L (ref 98–110)
CO2: 30 mmol/L (ref 20–31)
Calcium: 9.6 mg/dL (ref 8.6–10.4)
Creat: 1.01 mg/dL (ref 0.50–1.05)
GFR, Est African American: 75 mL/min (ref >=60)
GFR, Est Non African American: 65 mL/min (ref >=60)
GLUCOSE: 90 mg/dL (ref 65–99)
POTASSIUM: 5.2 mmol/L (ref 3.5–5.3)
SODIUM: 141 mmol/L (ref 135–146)
Total Bilirubin: 0.3 mg/dL (ref 0.2–1.2)
Total Protein: 7 g/dL (ref 6.1–8.1)

## 2016-06-02 MED ORDER — ALBUTEROL SULFATE HFA 108 (90 BASE) MCG/ACT IN AERS: 2.0000 | INHALATION_SPRAY | Freq: Four times a day (QID) | RESPIRATORY_TRACT | 2 refills | Status: DC | PRN

## 2016-06-02 MED ORDER — GABAPENTIN 400 MG PO CAPS: 400.0000 mg | ORAL_CAPSULE | Freq: Three times a day (TID) | ORAL | 1 refills | Status: DC

## 2016-06-02 MED ORDER — ATORVASTATIN CALCIUM 40 MG PO TABS: 40.0000 mg | ORAL_TABLET | Freq: Every day | ORAL | 1 refills | Status: DC

## 2016-06-02 MED ORDER — OXYCODONE-ACETAMINOPHEN 5-325 MG PO TABS: 1.0000 | ORAL_TABLET | Freq: Three times a day (TID) | ORAL | 0 refills | Status: DC | PRN

## 2016-06-02 MED ORDER — CLONIDINE HCL 0.1 MG PO TABS: 0.1000 mg | ORAL_TABLET | Freq: Two times a day (BID) | ORAL | 0 refills | Status: DC
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
## 2016-07-13 ENCOUNTER — Encounter: Payer: Self-pay | Admitting: Family Medicine

## 2016-07-13 ENCOUNTER — Ambulatory Visit (INDEPENDENT_AMBULATORY_CARE_PROVIDER_SITE_OTHER): Payer: Medicare Other | Admitting: Family Medicine

## 2016-07-13 VITALS — BP 130/84 | HR 79 | Temp 98.0°F | Resp 16 | Ht 62.0 in | Wt 141.3 lb

## 2016-07-13 DIAGNOSIS — J449 Chronic obstructive pulmonary disease, unspecified: Secondary | ICD-10-CM

## 2016-07-13 DIAGNOSIS — M25512 Pain in left shoulder: Secondary | ICD-10-CM

## 2016-07-13 DIAGNOSIS — R079 Chest pain, unspecified: Secondary | ICD-10-CM

## 2016-07-13 DIAGNOSIS — G8929 Other chronic pain: Secondary | ICD-10-CM | POA: Diagnosis not present

## 2016-07-13 DIAGNOSIS — M5416 Radiculopathy, lumbar region: Secondary | ICD-10-CM

## 2016-07-13 MED ORDER — TIOTROPIUM BROMIDE MONOHYDRATE 18 MCG IN CAPS: 18.0000 ug | ORAL_CAPSULE | Freq: Every day | RESPIRATORY_TRACT | 0 refills | Status: DC

## 2016-07-13 MED ORDER — OXYCODONE-ACETAMINOPHEN 5-325 MG PO TABS: 1.0000 | ORAL_TABLET | Freq: Three times a day (TID) | ORAL | 0 refills | Status: DC | PRN
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
## 2016-07-15 DIAGNOSIS — R011 Cardiac murmur, unspecified: Secondary | ICD-10-CM | POA: Diagnosis not present

## 2016-07-15 DIAGNOSIS — R0602 Shortness of breath: Secondary | ICD-10-CM | POA: Diagnosis not present

## 2016-07-15 DIAGNOSIS — M7532 Calcific tendinitis of left shoulder: Secondary | ICD-10-CM | POA: Diagnosis not present

## 2016-07-15 DIAGNOSIS — Z955 Presence of coronary angioplasty implant and graft: Secondary | ICD-10-CM | POA: Diagnosis not present

## 2016-07-15 DIAGNOSIS — I208 Other forms of angina pectoris: Secondary | ICD-10-CM | POA: Diagnosis not present

## 2016-07-15 DIAGNOSIS — M25512 Pain in left shoulder: Secondary | ICD-10-CM | POA: Diagnosis not present

## 2016-08-05 DIAGNOSIS — I208 Other forms of angina pectoris: Secondary | ICD-10-CM | POA: Diagnosis not present

## 2016-08-11 ENCOUNTER — Ambulatory Visit (INDEPENDENT_AMBULATORY_CARE_PROVIDER_SITE_OTHER): Payer: Medicare Other | Admitting: Family Medicine

## 2016-08-11 ENCOUNTER — Encounter: Payer: Self-pay | Admitting: Family Medicine

## 2016-08-11 DIAGNOSIS — C801 Malignant (primary) neoplasm, unspecified: Secondary | ICD-10-CM | POA: Diagnosis not present

## 2016-08-11 DIAGNOSIS — J449 Chronic obstructive pulmonary disease, unspecified: Secondary | ICD-10-CM

## 2016-08-11 DIAGNOSIS — G63 Polyneuropathy in diseases classified elsewhere: Secondary | ICD-10-CM | POA: Diagnosis not present

## 2016-08-11 DIAGNOSIS — M5416 Radiculopathy, lumbar region: Secondary | ICD-10-CM | POA: Diagnosis not present

## 2016-08-11 DIAGNOSIS — G8929 Other chronic pain: Secondary | ICD-10-CM | POA: Diagnosis not present

## 2016-08-11 DIAGNOSIS — I10 Essential (primary) hypertension: Secondary | ICD-10-CM | POA: Diagnosis not present

## 2016-08-11 MED ORDER — OXYCODONE-ACETAMINOPHEN 5-325 MG PO TABS: 1.0000 | ORAL_TABLET | Freq: Three times a day (TID) | ORAL | 0 refills | Status: DC | PRN

## 2016-08-11 MED ORDER — ALBUTEROL SULFATE HFA 108 (90 BASE) MCG/ACT IN AERS: 2.0000 | INHALATION_SPRAY | Freq: Four times a day (QID) | RESPIRATORY_TRACT | 2 refills | Status: DC | PRN

## 2016-08-11 MED ORDER — CLONIDINE HCL 0.1 MG PO TABS: 0.1000 mg | ORAL_TABLET | Freq: Two times a day (BID) | ORAL | 0 refills | Status: DC

## 2016-08-11 MED ORDER — GABAPENTIN 400 MG PO CAPS: 400.0000 mg | ORAL_CAPSULE | Freq: Three times a day (TID) | ORAL | 1 refills | Status: DC
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
## 2016-08-12 ENCOUNTER — Ambulatory Visit: Payer: Medicare Other | Admitting: Family Medicine

## 2016-08-29 IMAGING — MR MR SACRUM / SI JOINTS WO CM
4 of 5 series · 26 of 48 positions shown · non-contrast
Comparison: None.

CLINICAL DATA: Status post fall, landed on the buttocks, low back
pain

EXAM:
MR SACRUM WITHOUT CONTRAST
TECHNIQUE: Multiplanar, multisequence MR imaging was performed. No intravenous
contrast was administered.

[Series 2: T1 · axial · 5.0mm · 1.02mm/px · z∈[-138,+28]mm · 9 of 31 slices shown (1 of 3)]
[im 1/31]
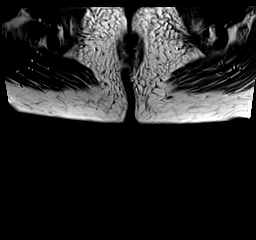
[im 6/31]
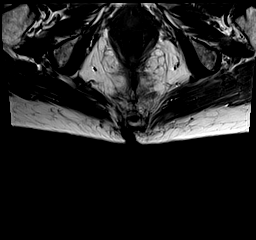
[im 9/31]
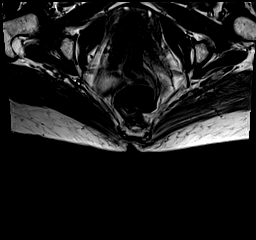
[im 14/31]
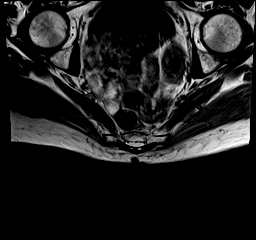
[im 17/31]
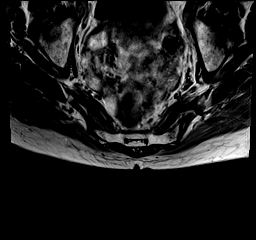
[im 22/31]
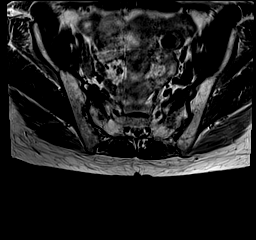
[im 25/31]
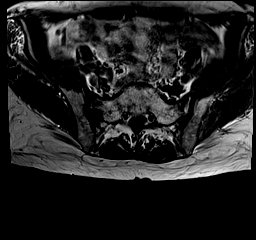
[im 28/31]
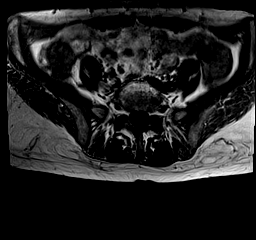
[im 31/31]
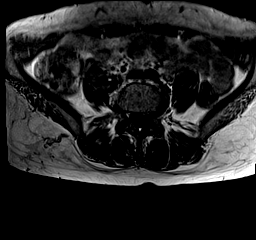

[Series 3: T2 fat-sat · axial · 5.0mm · 0.51mm/px · z∈[-141,+25]mm · 9 of 31 slices shown]
[im 1/31]
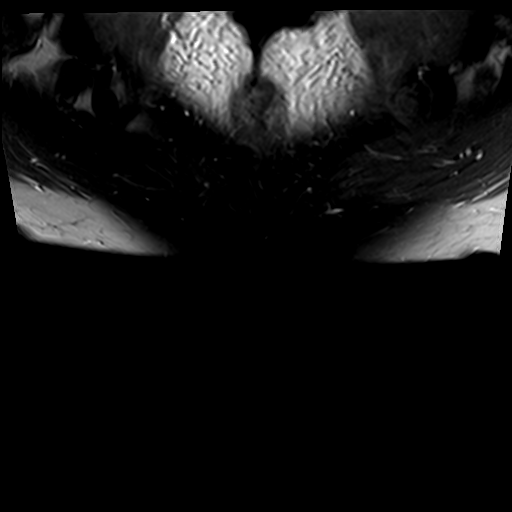
[im 6/31]
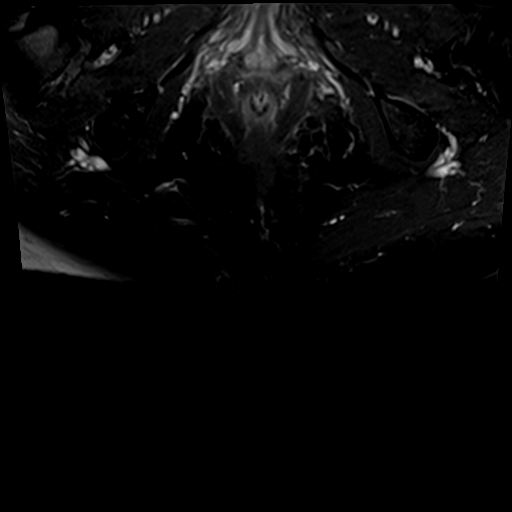
[im 9/31]
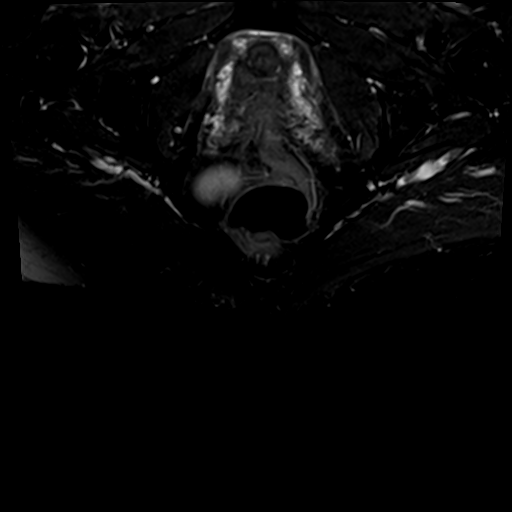
[im 14/31]
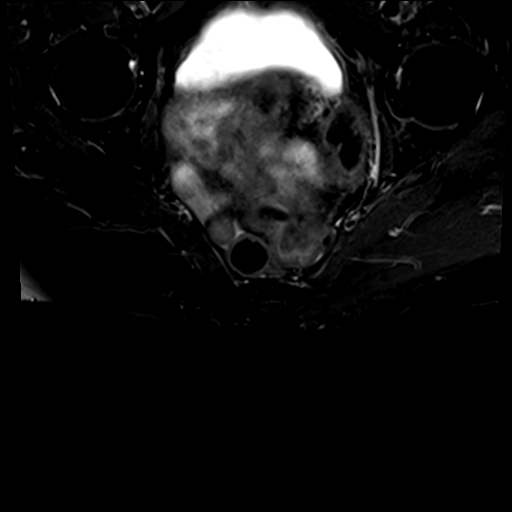
[im 17/31]
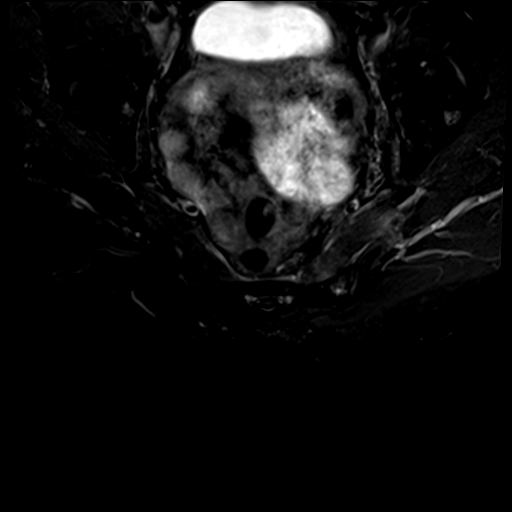
[im 22/31]
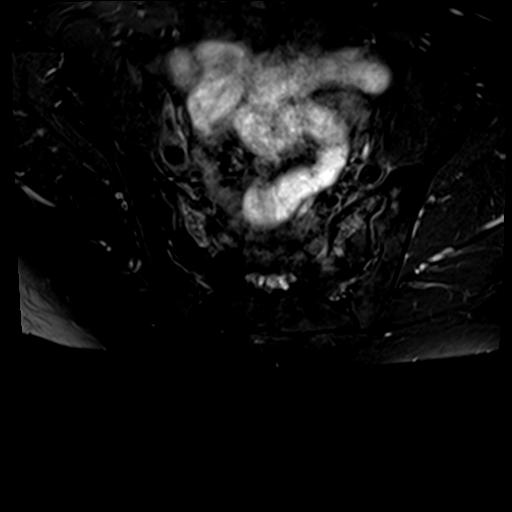
[im 25/31]
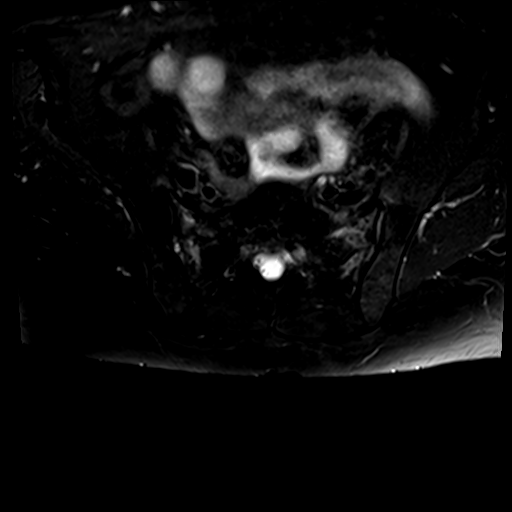
[im 28/31]
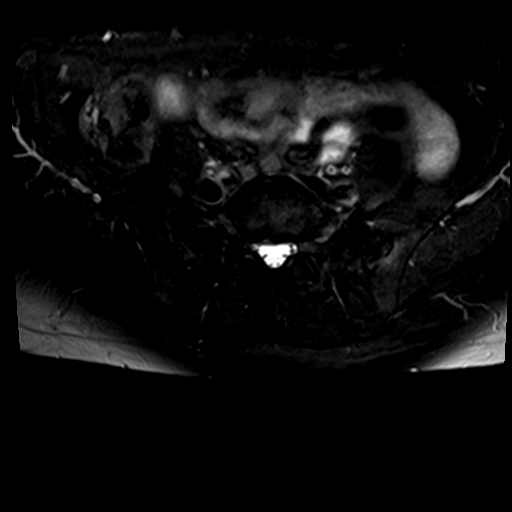
[im 31/31]
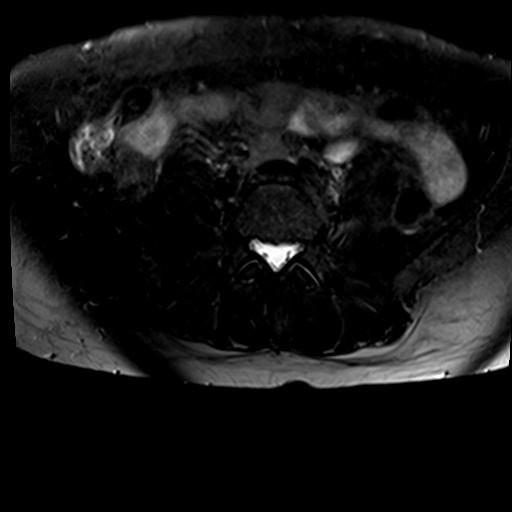

[Series 4: T1 · sagittal · 5.0mm · 0.45mm/px · 5 of 27 slices shown (2 of 3)]
[im 1/27]
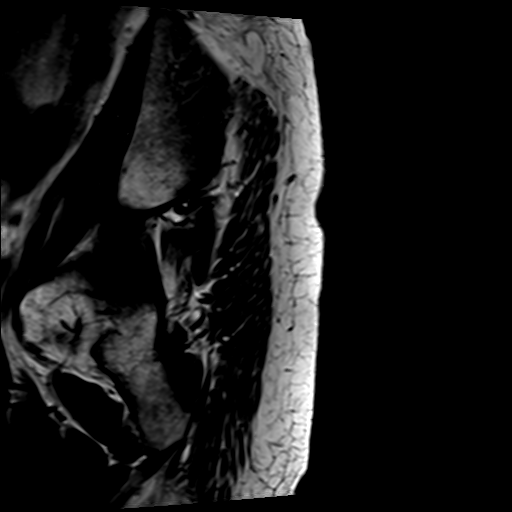
[im 3/27]
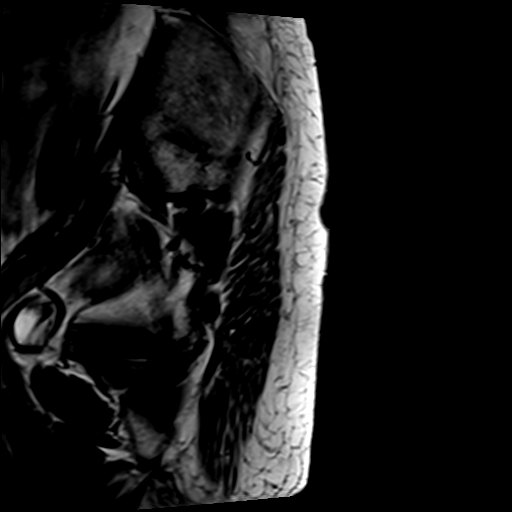
[im 9/27]
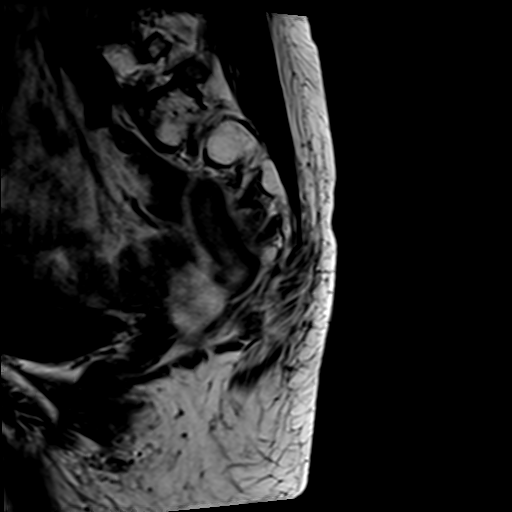
[im 15/27]
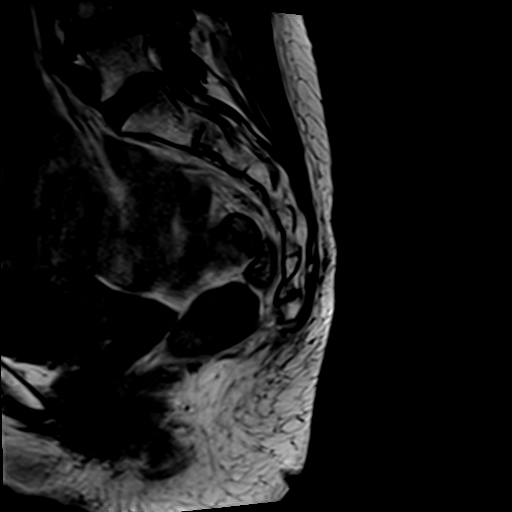
[im 24/27]
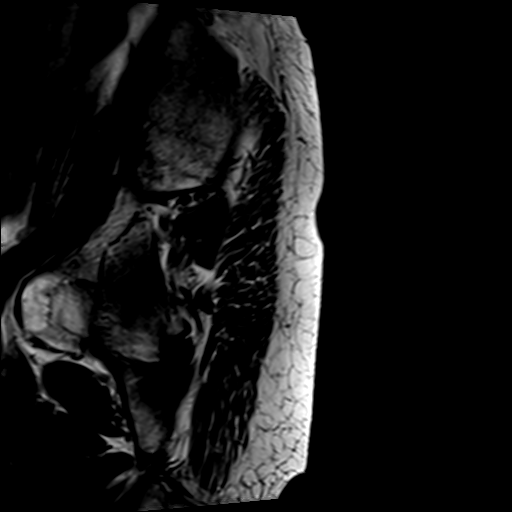

[Series 5: T1 · coronal · 4.0mm · 0.55mm/px · 3 of 20 slices shown (3 of 3)]
[im 4/20]
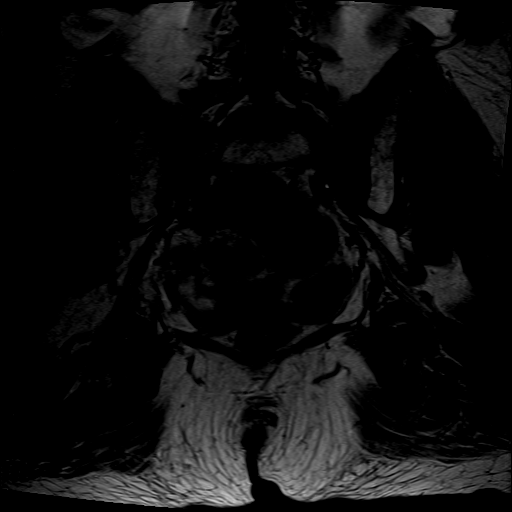
[im 10/20]
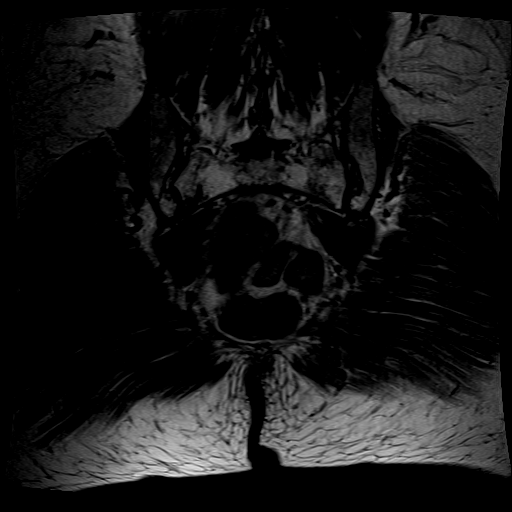
[im 16/20]
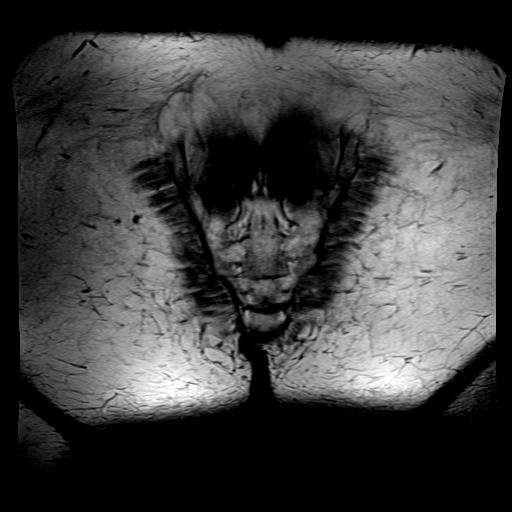

[26 of 48 positions shown; findings below may reference images not displayed]

FINDINGS: Bones/Joint/Cartilage

Marrow edema in the right and left sacrum with vertical linear
signal abnormalities most consistent with bilateral sacral ala
fractures. No other fracture or dislocation. Normal alignment. No
joint effusion.

Tendons
Flexor and extensor compartment tendons are intact.

Muscles

No focal muscle abnormality.

Soft tissue
No fluid collection or hematoma.  No soft tissue mass.
IMPRESSION: Subacute bilateral sacral ala fractures with persistent marrow
edema.

## 2016-09-16 ENCOUNTER — Ambulatory Visit (INDEPENDENT_AMBULATORY_CARE_PROVIDER_SITE_OTHER): Payer: Medicare Other | Admitting: Family Medicine

## 2016-09-16 ENCOUNTER — Encounter: Payer: Self-pay | Admitting: Family Medicine

## 2016-09-16 VITALS — BP 128/86 | HR 79 | Temp 98.1°F | Resp 16 | Ht 62.0 in | Wt 137.4 lb

## 2016-09-16 DIAGNOSIS — M62838 Other muscle spasm: Secondary | ICD-10-CM | POA: Diagnosis not present

## 2016-09-16 DIAGNOSIS — J449 Chronic obstructive pulmonary disease, unspecified: Secondary | ICD-10-CM

## 2016-09-16 DIAGNOSIS — M5416 Radiculopathy, lumbar region: Secondary | ICD-10-CM | POA: Diagnosis not present

## 2016-09-16 DIAGNOSIS — G8929 Other chronic pain: Secondary | ICD-10-CM

## 2016-09-16 MED ORDER — TIZANIDINE HCL 2 MG PO TABS
2.0000 mg | ORAL_TABLET | Freq: Three times a day (TID) | ORAL | 0 refills | Status: AC | PRN
Start: 2016-09-16 — End: 2016-10-16

## 2016-09-16 MED ORDER — ALBUTEROL SULFATE HFA 108 (90 BASE) MCG/ACT IN AERS: 2.0000 | INHALATION_SPRAY | Freq: Four times a day (QID) | RESPIRATORY_TRACT | 2 refills | Status: DC | PRN

## 2016-09-16 MED ORDER — OXYCODONE-ACETAMINOPHEN 5-325 MG PO TABS: 1.0000 | ORAL_TABLET | Freq: Three times a day (TID) | ORAL | 0 refills | Status: DC | PRN
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
## 2016-10-13 ENCOUNTER — Ambulatory Visit
Admission: RE | Admit: 2016-10-13 | Discharge: 2016-10-13 | Disposition: A | Discharge status: Home / Self Care | Payer: Medicare Other | Source: Ambulatory Visit | Attending: Family Medicine | Admitting: Family Medicine

## 2016-10-13 ENCOUNTER — Encounter: Payer: Self-pay | Admitting: Family Medicine

## 2016-10-13 ENCOUNTER — Ambulatory Visit (INDEPENDENT_AMBULATORY_CARE_PROVIDER_SITE_OTHER): Payer: Medicare Other | Admitting: Family Medicine

## 2016-10-13 VITALS — BP 130/72 | HR 91 | Temp 98.3°F | Resp 17 | Ht 62.0 in | Wt 132.7 lb

## 2016-10-13 DIAGNOSIS — M25511 Pain in right shoulder: Secondary | ICD-10-CM

## 2016-10-13 DIAGNOSIS — M5416 Radiculopathy, lumbar region: Secondary | ICD-10-CM | POA: Diagnosis not present

## 2016-10-13 DIAGNOSIS — M19011 Primary osteoarthritis, right shoulder: Secondary | ICD-10-CM | POA: Insufficient documentation

## 2016-10-13 DIAGNOSIS — G8929 Other chronic pain: Secondary | ICD-10-CM

## 2016-10-13 DIAGNOSIS — J449 Chronic obstructive pulmonary disease, unspecified: Secondary | ICD-10-CM

## 2016-10-13 MED ORDER — ALBUTEROL SULFATE HFA 108 (90 BASE) MCG/ACT IN AERS: 2.0000 | INHALATION_SPRAY | Freq: Four times a day (QID) | RESPIRATORY_TRACT | 2 refills | Status: DC | PRN

## 2016-10-13 MED ORDER — OXYCODONE-ACETAMINOPHEN 5-325 MG PO TABS: 1.0000 | ORAL_TABLET | Freq: Three times a day (TID) | ORAL | 0 refills | Status: DC | PRN
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
## 2016-10-15 ENCOUNTER — Other Ambulatory Visit: Payer: Self-pay | Admitting: Family Medicine

## 2016-10-15 DIAGNOSIS — M25511 Pain in right shoulder: Secondary | ICD-10-CM | POA: Insufficient documentation

## 2016-10-26 ENCOUNTER — Ambulatory Visit: Payer: Medicare Other

## 2016-10-27 ENCOUNTER — Ambulatory Visit: Payer: Medicare Other | Attending: Family Medicine

## 2016-10-27 VITALS — BP 153/97 | HR 70

## 2016-10-27 DIAGNOSIS — M6281 Muscle weakness (generalized): Secondary | ICD-10-CM

## 2016-10-27 DIAGNOSIS — G8929 Other chronic pain: Secondary | ICD-10-CM | POA: Diagnosis not present

## 2016-10-27 DIAGNOSIS — M25511 Pain in right shoulder: Secondary | ICD-10-CM | POA: Insufficient documentation
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
## 2016-11-03 ENCOUNTER — Ambulatory Visit: Payer: Medicare Other | Attending: Family Medicine

## 2016-11-03 DIAGNOSIS — G8929 Other chronic pain: Secondary | ICD-10-CM | POA: Insufficient documentation

## 2016-11-03 DIAGNOSIS — M25511 Pain in right shoulder: Secondary | ICD-10-CM | POA: Insufficient documentation

## 2016-11-03 DIAGNOSIS — M6281 Muscle weakness (generalized): Secondary | ICD-10-CM | POA: Diagnosis not present
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
## 2016-11-03 NOTE — Patient Instructions (Signed)
External Rotation (Isometric)    Place back of right fist against door frame, with elbow bent. Shoulder blades back and down. Press fist against door frame. Hold _5___ seconds. Repeat _10___ times. Do __3__ sessions per day.  http://gt2.exer.us/110   Copyright  VHI. All rights reserved.

## 2016-11-09 ENCOUNTER — Ambulatory Visit: Payer: Medicare Other

## 2016-11-11 ENCOUNTER — Telehealth: Payer: Self-pay

## 2016-11-11 ENCOUNTER — Ambulatory Visit: Payer: Medicare Other

## 2016-11-11 NOTE — Telephone Encounter (Signed)
No show. Called patient who said that she forgot to call. Cannot make it to her Wednesday 11/17/16 appointment due to a doctor's appointment. Can make it to 11/16/2016 at 9:15 am. Shoulder was hurting after last session. Felt sore and throbbing.

## 2016-11-16 ENCOUNTER — Ambulatory Visit (INDEPENDENT_AMBULATORY_CARE_PROVIDER_SITE_OTHER): Payer: Medicare Other | Admitting: Family Medicine

## 2016-11-16 ENCOUNTER — Encounter: Payer: Self-pay | Admitting: Family Medicine

## 2016-11-16 ENCOUNTER — Ambulatory Visit: Payer: Medicare Other

## 2016-11-16 VITALS — BP 134/76 | HR 102 | Temp 98.9°F | Resp 17 | Ht 62.0 in | Wt 139.4 lb

## 2016-11-16 DIAGNOSIS — G8929 Other chronic pain: Secondary | ICD-10-CM

## 2016-11-16 DIAGNOSIS — J449 Chronic obstructive pulmonary disease, unspecified: Secondary | ICD-10-CM | POA: Diagnosis not present

## 2016-11-16 DIAGNOSIS — M5416 Radiculopathy, lumbar region: Secondary | ICD-10-CM

## 2016-11-16 DIAGNOSIS — R0981 Nasal congestion: Secondary | ICD-10-CM | POA: Diagnosis not present

## 2016-11-16 MED ORDER — TIOTROPIUM BROMIDE MONOHYDRATE 18 MCG IN CAPS: 18.0000 ug | ORAL_CAPSULE | Freq: Every day | RESPIRATORY_TRACT | 0 refills | Status: DC

## 2016-11-16 MED ORDER — OXYCODONE-ACETAMINOPHEN 5-325 MG PO TABS: 1.0000 | ORAL_TABLET | Freq: Three times a day (TID) | ORAL | 0 refills | Status: DC | PRN

## 2016-11-16 MED ORDER — ALBUTEROL SULFATE HFA 108 (90 BASE) MCG/ACT IN AERS: 2.0000 | INHALATION_SPRAY | Freq: Four times a day (QID) | RESPIRATORY_TRACT | 2 refills | Status: DC | PRN

## 2016-11-16 MED ORDER — MOMETASONE FUROATE 50 MCG/ACT NA SUSP: 2.0000 | Freq: Every day | NASAL | 0 refills | Status: DC
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
## 2016-11-17 ENCOUNTER — Ambulatory Visit: Payer: Medicare Other

## 2016-11-17 ENCOUNTER — Ambulatory Visit: Payer: Medicare Other | Admitting: Family Medicine

## 2016-11-22 ENCOUNTER — Ambulatory Visit: Payer: Medicare Other

## 2016-11-24 ENCOUNTER — Telehealth: Payer: Self-pay

## 2016-11-24 ENCOUNTER — Ambulatory Visit: Payer: Medicare Other

## 2016-11-24 NOTE — Telephone Encounter (Signed)
No show. Called pt cell phone number and left a message pertaining to her 5:45 pm Wed 11/17/2016, 5:30 pm Thursday 11/11/16, and 5:45 pm Tues 11/09/16 appointments (due to no shows) and asked if she is able to make it to physical therapy even though the times were later in the day per her request. Return phone call requested. Phone number [phone]) provided.

## 2016-11-29 ENCOUNTER — Ambulatory Visit: Payer: Medicare Other

## 2016-12-01 ENCOUNTER — Ambulatory Visit: Payer: Medicare Other

## 2016-12-09 ENCOUNTER — Encounter: Payer: Self-pay | Admitting: Family Medicine

## 2016-12-09 ENCOUNTER — Ambulatory Visit (INDEPENDENT_AMBULATORY_CARE_PROVIDER_SITE_OTHER): Payer: Medicare Other | Admitting: Family Medicine

## 2016-12-09 DIAGNOSIS — M5416 Radiculopathy, lumbar region: Secondary | ICD-10-CM

## 2016-12-09 DIAGNOSIS — J449 Chronic obstructive pulmonary disease, unspecified: Secondary | ICD-10-CM

## 2016-12-09 DIAGNOSIS — G8929 Other chronic pain: Secondary | ICD-10-CM | POA: Diagnosis not present

## 2016-12-09 DIAGNOSIS — I10 Essential (primary) hypertension: Secondary | ICD-10-CM

## 2016-12-09 MED ORDER — ALBUTEROL SULFATE HFA 108 (90 BASE) MCG/ACT IN AERS: 2.0000 | INHALATION_SPRAY | Freq: Four times a day (QID) | RESPIRATORY_TRACT | 2 refills | Status: DC | PRN

## 2016-12-09 MED ORDER — CLONIDINE HCL 0.1 MG PO TABS: 0.1000 mg | ORAL_TABLET | Freq: Two times a day (BID) | ORAL | 0 refills | Status: DC

## 2016-12-09 MED ORDER — OXYCODONE-ACETAMINOPHEN 5-325 MG PO TABS: 1.0000 | ORAL_TABLET | Freq: Three times a day (TID) | ORAL | 0 refills | Status: DC | PRN
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
## 2016-12-16 ENCOUNTER — Ambulatory Visit: Payer: Medicare Other | Admitting: Family Medicine

## 2016-12-16 DIAGNOSIS — M6281 Muscle weakness (generalized): Secondary | ICD-10-CM

## 2016-12-16 DIAGNOSIS — G8929 Other chronic pain: Secondary | ICD-10-CM

## 2016-12-16 DIAGNOSIS — M25511 Pain in right shoulder: Principal | ICD-10-CM
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
## 2017-01-11 ENCOUNTER — Encounter: Payer: Self-pay | Admitting: Family Medicine

## 2017-01-11 ENCOUNTER — Ambulatory Visit (INDEPENDENT_AMBULATORY_CARE_PROVIDER_SITE_OTHER): Payer: Medicare Other | Admitting: Family Medicine

## 2017-01-11 ENCOUNTER — Telehealth: Payer: Self-pay | Admitting: Family Medicine

## 2017-01-11 VITALS — BP 128/78 | HR 92 | Ht 62.0 in | Wt 131.5 lb

## 2017-01-11 DIAGNOSIS — M5416 Radiculopathy, lumbar region: Secondary | ICD-10-CM | POA: Diagnosis not present

## 2017-01-11 DIAGNOSIS — B029 Zoster without complications: Secondary | ICD-10-CM

## 2017-01-11 DIAGNOSIS — Z23 Encounter for immunization: Secondary | ICD-10-CM | POA: Diagnosis not present

## 2017-01-11 DIAGNOSIS — J449 Chronic obstructive pulmonary disease, unspecified: Secondary | ICD-10-CM

## 2017-01-11 DIAGNOSIS — G8929 Other chronic pain: Secondary | ICD-10-CM | POA: Diagnosis not present

## 2017-01-11 MED ORDER — ALBUTEROL SULFATE HFA 108 (90 BASE) MCG/ACT IN AERS: 2.0000 | INHALATION_SPRAY | Freq: Four times a day (QID) | RESPIRATORY_TRACT | 2 refills | Status: DC | PRN

## 2017-01-11 MED ORDER — OXYCODONE-ACETAMINOPHEN 5-325 MG PO TABS: 1.0000 | ORAL_TABLET | Freq: Three times a day (TID) | ORAL | 0 refills | Status: DC | PRN

## 2017-01-11 MED ORDER — VALACYCLOVIR HCL 1 G PO TABS: 1000.0000 mg | ORAL_TABLET | Freq: Three times a day (TID) | ORAL | 0 refills | Status: AC
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
## 2017-01-21 ENCOUNTER — Emergency Department
Admission: EM | Admit: 2017-01-21 | Discharge: 2017-01-21 | Disposition: A | Discharge status: Home / Self Care | Payer: Medicare Other | Attending: Emergency Medicine | Admitting: Emergency Medicine

## 2017-01-21 ENCOUNTER — Encounter: Payer: Self-pay | Admitting: *Deleted

## 2017-01-21 ENCOUNTER — Emergency Department: Payer: Medicare Other

## 2017-01-21 DIAGNOSIS — R079 Chest pain, unspecified: Secondary | ICD-10-CM | POA: Diagnosis not present

## 2017-01-21 DIAGNOSIS — G8929 Other chronic pain: Secondary | ICD-10-CM | POA: Diagnosis not present

## 2017-01-21 DIAGNOSIS — R0600 Dyspnea, unspecified: Secondary | ICD-10-CM | POA: Diagnosis not present

## 2017-01-21 DIAGNOSIS — I10 Essential (primary) hypertension: Secondary | ICD-10-CM | POA: Insufficient documentation

## 2017-01-21 DIAGNOSIS — Z79899 Other long term (current) drug therapy: Secondary | ICD-10-CM | POA: Diagnosis not present

## 2017-01-21 DIAGNOSIS — J449 Chronic obstructive pulmonary disease, unspecified: Secondary | ICD-10-CM | POA: Diagnosis not present

## 2017-01-21 DIAGNOSIS — F17210 Nicotine dependence, cigarettes, uncomplicated: Secondary | ICD-10-CM | POA: Diagnosis not present

## 2017-01-21 DIAGNOSIS — R0789 Other chest pain: Secondary | ICD-10-CM

## 2017-01-21 DIAGNOSIS — R0602 Shortness of breath: Secondary | ICD-10-CM | POA: Diagnosis not present

## 2017-01-21 DIAGNOSIS — Z7982 Long term (current) use of aspirin: Secondary | ICD-10-CM | POA: Diagnosis not present

## 2017-01-21 HISTORY — DX: Schizophrenia, unspecified: F20.9

## 2017-01-21 HISTORY — DX: Dissociative identity disorder: F44.81

## 2017-01-21 HISTORY — DX: Calculus of gallbladder without cholecystitis without obstruction: K80.20

## 2017-01-21 HISTORY — DX: Bipolar disorder, unspecified: F31.9

## 2017-01-21 LAB — CBC
HEMATOCRIT: 42.3 % (ref 35.0–47.0)
HEMOGLOBIN: 14.5 g/dL (ref 12.0–16.0)
MCH: 31.1 pg (ref 26.0–34.0)
MCHC: 34.2 g/dL (ref 32.0–36.0)
MCV: 90.8 fL (ref 80.0–100.0)
Platelets: 273 10*3/uL (ref 150–440)
RBC: 4.66 MIL/uL (ref 3.80–5.20)
RDW: 13.3 % (ref 11.5–14.5)
WBC: 15.9 10*3/uL — ABNORMAL HIGH (ref 3.6–11.0)

## 2017-01-21 LAB — BASIC METABOLIC PANEL
ANION GAP: 9 (ref 5–15)
BUN: 18 mg/dL (ref 6–20)
CALCIUM: 9.3 mg/dL (ref 8.9–10.3)
CHLORIDE: 105 mmol/L (ref 101–111)
CO2: 23 mmol/L (ref 22–32)
Creatinine, Ser: 0.95 mg/dL (ref 0.44–1.00)
GFR calc non Af Amer: >60 mL/min (ref >60)
GLUCOSE: 112 mg/dL — AB (ref 65–99)
Potassium: 3.9 mmol/L (ref 3.5–5.1)
Sodium: 137 mmol/L (ref 135–145)

## 2017-01-21 LAB — TROPONIN I

## 2017-01-21 MED ORDER — OXYCODONE-ACETAMINOPHEN 5-325 MG PO TABS
1.0000 | ORAL_TABLET | Freq: Once | ORAL | Status: AC
  Administered 2017-01-21: 1 via ORAL
  Filled 2017-01-21: qty 1
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
## 2017-01-21 NOTE — ED Triage Notes (Signed)
Per EMS report, Patient c/o right-side chest pain with shortness of breath that radiates to right arm since yesterday. Patient has a history of MI and cardiac stent, with one stent failure and replacement. Patient states chest pressure woke her up during the night. EMS report wheezing in all 4 lobes and patient was given one Duoneb treatment enroute and 324mg  Aspirin.

## 2017-01-21 NOTE — ED Notes (Signed)
Patient taken to imaging. 

## 2017-01-21 NOTE — ED Notes (Signed)
Patient asked to use the room commode before getting an IV. Patient ambulated with steady gait.

## 2017-01-21 NOTE — ED Notes (Signed)
E signature pad did not work and patient was anxious to leave.

## 2017-01-21 NOTE — ED Notes (Signed)
Patient is anxious to leave, states her mother is bringing the car to the ED entrance and she doesn't want her to wait.

## 2017-01-21 NOTE — ED Notes (Signed)
Visitor at bedside.

## 2017-01-28 ENCOUNTER — Ambulatory Visit: Payer: Medicare Other

## 2017-01-31 ENCOUNTER — Encounter: Payer: Medicare Other | Admitting: Family Medicine

## 2017-01-31 ENCOUNTER — Ambulatory Visit: Payer: Medicare Other

## 2017-02-02 ENCOUNTER — Ambulatory Visit (INDEPENDENT_AMBULATORY_CARE_PROVIDER_SITE_OTHER): Payer: Medicare Other

## 2017-02-02 ENCOUNTER — Ambulatory Visit: Payer: Self-pay

## 2017-02-02 VITALS — BP 110/60 | HR 88 | Resp 16 | Ht 62.0 in | Wt 133.6 lb

## 2017-02-02 DIAGNOSIS — E2839 Other primary ovarian failure: Secondary | ICD-10-CM | POA: Diagnosis not present

## 2017-02-02 DIAGNOSIS — Z114 Encounter for screening for human immunodeficiency virus [HIV]: Secondary | ICD-10-CM | POA: Diagnosis not present

## 2017-02-02 DIAGNOSIS — Z0000 Encounter for general adult medical examination without abnormal findings: Secondary | ICD-10-CM | POA: Diagnosis not present

## 2017-02-02 DIAGNOSIS — Z1231 Encounter for screening mammogram for malignant neoplasm of breast: Secondary | ICD-10-CM | POA: Diagnosis not present

## 2017-02-02 DIAGNOSIS — Z1239 Encounter for other screening for malignant neoplasm of breast: Secondary | ICD-10-CM
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
## 2017-02-02 NOTE — Patient Instructions (Addendum)
Carol Henderson , Thank you for taking time to come for your Medicare Wellness Visit. I appreciate your ongoing commitment to your health goals. Please review the following plan we discussed and let me know if I can assist you in the future.   Screening recommendations/referrals: Colonoscopy: Completed 04/29/16. Repeat colonoscopy every 5 years Mammogram: Ordered today. Please call [phone] to schedule your mammogram.  Bone Density: Ordered today. Please call [phone] to schedule your bone density scan.  Recommended yearly ophthalmology/optometry visit for glaucoma screening and checkup Recommended yearly dental visit for hygiene and checkup  Vaccinations: Influenza vaccine: Up to date Pneumococcal vaccine: Declined Tdap vaccine: Declined. Please call your insurance company to determine your out of pocket expense. Shingles vaccine: Declined. Please call your insurance company to determine your out of pocket expense.  Advanced directives: Advance directive discussed with you today. Even though you declined this today please call our office should you change your mind and we can give you the proper paperwork for you to fill out.  Conditions/risks identified: Fall risk prevention discussed; Recommend to increase fluid intake from 3-4 glasses per day to 6-8 glasses per day  Next appointment: Scheduled to see Dr. Manuella Ghazi on 02/14/17 @ 1:20pm. Follow up with your Nurse Health Advisor in one year for your annual wellness exam.  Preventive Care 40-64 Years, Female Preventive care refers to lifestyle choices and visits with your health care provider that can promote health and wellness. What does preventive care include?  A yearly physical exam. This is also called an annual well check.  Dental exams once or twice a year.  Routine eye exams. Ask your health care provider how often you should have your eyes checked.  Personal lifestyle choices, including:  Daily care of your teeth and  gums.  Regular physical activity.  Eating a healthy diet.  Avoiding tobacco and drug use.  Limiting alcohol use.  Practicing safe sex.  Taking low-dose aspirin daily starting at age 8.  Taking vitamin and mineral supplements as recommended by your health care provider. What happens during an annual well check? The services and screenings done by your health care provider during your annual well check will depend on your age, overall health, lifestyle risk factors, and family history of disease. Counseling  Your health care provider may ask you questions about your:  Alcohol use.  Tobacco use.  Drug use.  Emotional well-being.  Home and relationship well-being.  Sexual activity.  Eating habits.  Work and work Statistician.  Method of birth control.  Menstrual cycle.  Pregnancy history. Screening  You may have the following tests or measurements:  Height, weight, and BMI.  Blood pressure.  Lipid and cholesterol levels. These may be checked every 5 years, or more frequently if you are over 27 years old.  Skin check.  Lung cancer screening. You may have this screening every year starting at age 25 if you have a 30-pack-year history of smoking and currently smoke or have quit within the past 15 years.  Fecal occult blood test (FOBT) of the stool. You may have this test every year starting at age 56.  Flexible sigmoidoscopy or colonoscopy. You may have a sigmoidoscopy every 5 years or a colonoscopy every 10 years starting at age 68.  Hepatitis C blood test.  Hepatitis B blood test.  Sexually transmitted disease (STD) testing.  Diabetes screening. This is done by checking your blood sugar (glucose) after you have not eaten for a while (fasting). You may have this done  every 1-3 years.  Mammogram. This may be done every 1-2 years. Talk to your health care provider about when you should start having regular mammograms. This may depend on whether you have a  family history of breast cancer.  BRCA-related cancer screening. This may be done if you have a family history of breast, ovarian, tubal, or peritoneal cancers.  Pelvic exam and Pap test. This may be done every 3 years starting at age 79. Starting at age 37, this may be done every 5 years if you have a Pap test in combination with an HPV test.  Bone density scan. This is done to screen for osteoporosis. You may have this scan if you are at high risk for osteoporosis. Discuss your test results, treatment options, and if necessary, the need for more tests with your health care provider. Vaccines  Your health care provider may recommend certain vaccines, such as:  Influenza vaccine. This is recommended every year.  Tetanus, diphtheria, and acellular pertussis (Tdap, Td) vaccine. You may need a Td booster every 10 years.  Zoster vaccine. You may need this after age 54.  Pneumococcal 13-valent conjugate (PCV13) vaccine. You may need this if you have certain conditions and were not previously vaccinated.  Pneumococcal polysaccharide (PPSV23) vaccine. You may need one or two doses if you smoke cigarettes or if you have certain conditions. Talk to your health care provider about which screenings and vaccines you need and how often you need them. This information is not intended to replace advice given to you by your health care provider. Make sure you discuss any questions you have with your health care provider. Document Released: 04/18/2015 Document Revised: 12/10/2015 Document Reviewed: 01/21/2015 Elsevier Interactive Patient Education  2017 [place] Prevention in the Home Falls can cause injuries. They can happen to people of all ages. There are many things you can do to make your home safe and to help prevent falls. What can I do on the outside of my home?  Regularly fix the edges of walkways and driveways and fix any cracks.  Remove anything that might make you trip as  you walk through a door, such as a raised step or threshold.  Trim any bushes or trees on the path to your home.  Use bright outdoor lighting.  Clear any walking paths of anything that might make someone trip, such as rocks or tools.  Regularly check to see if handrails are loose or broken. Make sure that both sides of any steps have handrails.  Any raised decks and porches should have guardrails on the edges.  Have any leaves, snow, or ice cleared regularly.  Use sand or salt on walking paths during winter.  Clean up any spills in your garage right away. This includes oil or grease spills. What can I do in the bathroom?  Use night lights.  Install grab bars by the toilet and in the tub and shower. Do not use towel bars as grab bars.  Use non-skid mats or decals in the tub or shower.  If you need to sit down in the shower, use a plastic, non-slip stool.  Keep the floor dry. Clean up any water that spills on the floor as soon as it happens.  Remove soap buildup in the tub or shower regularly.  Attach bath mats securely with double-sided non-slip rug tape.  Do not have throw rugs and other things on the floor that can make you trip. What can I  do in the bedroom?  Use night lights.  Make sure that you have a light by your bed that is easy to reach.  Do not use any sheets or blankets that are too big for your bed. They should not hang down onto the floor.  Have a firm chair that has side arms. You can use this for support while you get dressed.  Do not have throw rugs and other things on the floor that can make you trip. What can I do in the kitchen?  Clean up any spills right away.  Avoid walking on wet floors.  Keep items that you use a lot in easy-to-reach places.  If you need to reach something above you, use a strong step stool that has a grab bar.  Keep electrical cords out of the way.  Do not use floor polish or wax that makes floors slippery. If you must  use wax, use non-skid floor wax.  Do not have throw rugs and other things on the floor that can make you trip. What can I do with my stairs?  Do not leave any items on the stairs.  Make sure that there are handrails on both sides of the stairs and use them. Fix handrails that are broken or loose. Make sure that handrails are as long as the stairways.  Check any carpeting to make sure that it is firmly attached to the stairs. Fix any carpet that is loose or worn.  Avoid having throw rugs at the top or bottom of the stairs. If you do have throw rugs, attach them to the floor with carpet tape.  Make sure that you have a light switch at the top of the stairs and the bottom of the stairs. If you do not have them, ask someone to add them for you. What else can I do to help prevent falls?  Wear shoes that:  Do not have high heels.  Have rubber bottoms.  Are comfortable and fit you well.  Are closed at the toe. Do not wear sandals.  If you use a stepladder:  Make sure that it is fully opened. Do not climb a closed stepladder.  Make sure that both sides of the stepladder are locked into place.  Ask someone to hold it for you, if possible.  Clearly mark and make sure that you can see:  Any grab bars or handrails.  First and last steps.  Where the edge of each step is.  Use tools that help you move around (mobility aids) if they are needed. These include:  Canes.  Walkers.  Scooters.  Crutches.  Turn on the lights when you go into a dark area. Replace any light bulbs as soon as they burn out.  Set up your furniture so you have a clear path. Avoid moving your furniture around.  If any of your floors are uneven, fix them.  If there are any pets around you, be aware of where they are.  Review your medicines with your doctor. Some medicines can make you feel dizzy. This can increase your chance of falling. Ask your doctor what other things that you can do to help prevent  falls. This information is not intended to replace advice given to you by your health care provider. Make sure you discuss any questions you have with your health care provider. Document Released: 01/16/2009 Document Revised: 08/28/2015 Document Reviewed: 04/26/2014 Elsevier Interactive Patient Education  2017 [employer].

## 2017-02-03 LAB — HIV ANTIBODY (ROUTINE TESTING W REFLEX): HIV: NONREACTIVE

## 2017-02-08 ENCOUNTER — Encounter: Payer: Self-pay | Admitting: Family Medicine

## 2017-02-08 ENCOUNTER — Ambulatory Visit (INDEPENDENT_AMBULATORY_CARE_PROVIDER_SITE_OTHER): Payer: Medicare Other | Admitting: Family Medicine

## 2017-02-08 DIAGNOSIS — J449 Chronic obstructive pulmonary disease, unspecified: Secondary | ICD-10-CM | POA: Diagnosis not present

## 2017-02-08 DIAGNOSIS — M5416 Radiculopathy, lumbar region: Secondary | ICD-10-CM

## 2017-02-08 DIAGNOSIS — G8929 Other chronic pain: Secondary | ICD-10-CM

## 2017-02-08 MED ORDER — ALBUTEROL SULFATE HFA 108 (90 BASE) MCG/ACT IN AERS: 2.0000 | INHALATION_SPRAY | Freq: Four times a day (QID) | RESPIRATORY_TRACT | 2 refills | Status: DC | PRN

## 2017-02-08 MED ORDER — OXYCODONE-ACETAMINOPHEN 5-325 MG PO TABS: 1.0000 | ORAL_TABLET | Freq: Three times a day (TID) | ORAL | 0 refills | Status: DC | PRN

## 2017-02-08 NOTE — Progress Notes (Signed)
The following letter was provided to Karene Fry during their visit today: ---------------------------------------  Dear valued [facility] Patient,  I am writing to share that as of May 20, 2017, I will no longer be seeing patients at [facility]. While it has been my privilege to care for you as a physician, I have decided to move outside of [place] to pursue other opportunities.  The staff at [facility] has been supportive of my decision and are supportive of any patients who have been under my care.  They will be happy to provide care to you and your family.  The office staff will do everything they can to ensure a seamless transition of care at [facility].  However if you are on any controlled substance medications (i.e. Pain medication or benzodiazepines), they will no longer be able to refill those medications, but we will be more than happy to refer you to a specialist.  [facility] will also assist you with the transfer of medical records should you wish to seek care elsewhere.  If you have any questions about your future care, you may call the office at [phone].  I have enjoyed getting to know my patients here and I wish you the very best.  Sincerely,  Rochel Brome, MD  ---------------------------------------  A written copy of this letter was given to the patient.  The patient verbalizes understanding of the letter, and does request referral to specialist or to new primary care provider.  This decision has been conveyed to Dr. Manuella Ghazi, who will place any appropriate referrals during today's visit.
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
## 2017-02-14 ENCOUNTER — Encounter: Payer: Medicare Other | Admitting: Family Medicine

## 2017-03-09 ENCOUNTER — Ambulatory Visit (INDEPENDENT_AMBULATORY_CARE_PROVIDER_SITE_OTHER): Payer: Medicare Other | Admitting: Family Medicine

## 2017-03-09 ENCOUNTER — Encounter: Payer: Self-pay | Admitting: Family Medicine

## 2017-03-09 VITALS — BP 146/84 | HR 81 | Temp 98.4°F | Resp 16 | Ht 62.0 in | Wt 132.4 lb

## 2017-03-09 DIAGNOSIS — G8929 Other chronic pain: Secondary | ICD-10-CM

## 2017-03-09 DIAGNOSIS — R1013 Epigastric pain: Secondary | ICD-10-CM

## 2017-03-09 DIAGNOSIS — M5416 Radiculopathy, lumbar region: Secondary | ICD-10-CM | POA: Diagnosis not present

## 2017-03-09 LAB — CBC WITH DIFFERENTIAL/PLATELET
BASOS PCT: 0.5 %
Basophils Absolute: 43 cells/uL (ref 0–200)
EOS ABS: 179 {cells}/uL (ref 15–500)
Eosinophils Relative: 2.1 %
HEMATOCRIT: 42.9 % (ref 35.0–45.0)
HEMOGLOBIN: 14.7 g/dL (ref 11.7–15.5)
LYMPHS ABS: 2737 {cells}/uL (ref 850–3900)
MCH: 30.4 pg (ref 27.0–33.0)
MCHC: 34.3 g/dL (ref 32.0–36.0)
MCV: 88.8 fL (ref 80.0–100.0)
MONOS PCT: 9.3 %
MPV: 9.9 fL (ref 7.5–12.5)
NEUTROS ABS: 4752 {cells}/uL (ref 1500–7800)
Neutrophils Relative %: 55.9 %
Platelets: 349 10*3/uL (ref 140–400)
RBC: 4.83 10*6/uL (ref 3.80–5.10)
RDW: 12.6 % (ref 11.0–15.0)
Total Lymphocyte: 32.2 %
WBC: 8.5 10*3/uL (ref 3.8–10.8)
WBCMIX: 791 {cells}/uL (ref 200–950)

## 2017-03-09 LAB — AMYLASE: AMYLASE: 44 U/L (ref 21–101)

## 2017-03-09 LAB — LIPASE: Lipase: 46 U/L (ref 7–60)

## 2017-03-09 MED ORDER — OXYCODONE-ACETAMINOPHEN 5-325 MG PO TABS: 1.0000 | ORAL_TABLET | Freq: Three times a day (TID) | ORAL | 0 refills | Status: DC | PRN
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
## 2017-03-14 ENCOUNTER — Ambulatory Visit: Admission: RE | Admit: 2017-03-14 | Payer: Medicare Other | Source: Ambulatory Visit

## 2017-04-11 ENCOUNTER — Telehealth: Payer: Self-pay | Admitting: Family Medicine

## 2017-04-11 NOTE — Telephone Encounter (Signed)
Copied from [place] [id number]. Topic: Quick Communication - See Telephone Encounter >> Apr 11, 2017  8:14 AM Synthia Innocent wrote: CRM for notification. See Telephone encounter for:  Requesting we call her pain clinic, they are not returning her calls and she needs her pain meds. She does not know name of clinic. Patient in a lot of pain. refiill meds? Please advise 04/11/17.

## 2017-04-12 ENCOUNTER — Encounter: Payer: Self-pay | Admitting: Student in an Organized Health Care Education/Training Program

## 2017-04-12 ENCOUNTER — Other Ambulatory Visit: Payer: Self-pay

## 2017-04-12 ENCOUNTER — Ambulatory Visit
Payer: Medicare Other | Attending: Student in an Organized Health Care Education/Training Program | Admitting: Student in an Organized Health Care Education/Training Program

## 2017-04-12 VITALS — BP 186/111 | HR 80 | Temp 98.1°F | Resp 16 | Ht 62.5 in | Wt 133.0 lb

## 2017-04-12 DIAGNOSIS — F603 Borderline personality disorder: Secondary | ICD-10-CM | POA: Diagnosis not present

## 2017-04-12 DIAGNOSIS — I129 Hypertensive chronic kidney disease with stage 1 through stage 4 chronic kidney disease, or unspecified chronic kidney disease: Secondary | ICD-10-CM | POA: Diagnosis not present

## 2017-04-12 DIAGNOSIS — M7918 Myalgia, other site: Secondary | ICD-10-CM | POA: Insufficient documentation

## 2017-04-12 DIAGNOSIS — F1123 Opioid dependence with withdrawal: Secondary | ICD-10-CM | POA: Insufficient documentation

## 2017-04-12 DIAGNOSIS — F1410 Cocaine abuse, uncomplicated: Secondary | ICD-10-CM | POA: Insufficient documentation

## 2017-04-12 DIAGNOSIS — M542 Cervicalgia: Secondary | ICD-10-CM | POA: Diagnosis not present

## 2017-04-12 DIAGNOSIS — E785 Hyperlipidemia, unspecified: Secondary | ICD-10-CM | POA: Insufficient documentation

## 2017-04-12 DIAGNOSIS — M546 Pain in thoracic spine: Secondary | ICD-10-CM | POA: Diagnosis not present

## 2017-04-12 DIAGNOSIS — M5136 Other intervertebral disc degeneration, lumbar region: Secondary | ICD-10-CM

## 2017-04-12 DIAGNOSIS — M545 Low back pain: Secondary | ICD-10-CM | POA: Insufficient documentation

## 2017-04-12 DIAGNOSIS — Z87442 Personal history of urinary calculi: Secondary | ICD-10-CM | POA: Diagnosis not present

## 2017-04-12 DIAGNOSIS — F319 Bipolar disorder, unspecified: Secondary | ICD-10-CM | POA: Insufficient documentation

## 2017-04-12 DIAGNOSIS — F17210 Nicotine dependence, cigarettes, uncomplicated: Secondary | ICD-10-CM | POA: Insufficient documentation

## 2017-04-12 DIAGNOSIS — Z765 Malingerer [conscious simulation]: Secondary | ICD-10-CM

## 2017-04-12 DIAGNOSIS — I219 Acute myocardial infarction, unspecified: Secondary | ICD-10-CM | POA: Diagnosis not present

## 2017-04-12 DIAGNOSIS — K219 Gastro-esophageal reflux disease without esophagitis: Secondary | ICD-10-CM | POA: Insufficient documentation

## 2017-04-12 DIAGNOSIS — F209 Schizophrenia, unspecified: Secondary | ICD-10-CM | POA: Insufficient documentation

## 2017-04-12 DIAGNOSIS — N189 Chronic kidney disease, unspecified: Secondary | ICD-10-CM | POA: Insufficient documentation

## 2017-04-12 DIAGNOSIS — I252 Old myocardial infarction: Secondary | ICD-10-CM | POA: Diagnosis not present

## 2017-04-12 DIAGNOSIS — K279 Peptic ulcer, site unspecified, unspecified as acute or chronic, without hemorrhage or perforation: Secondary | ICD-10-CM | POA: Diagnosis not present

## 2017-04-12 DIAGNOSIS — I2510 Atherosclerotic heart disease of native coronary artery without angina pectoris: Secondary | ICD-10-CM | POA: Insufficient documentation

## 2017-04-12 DIAGNOSIS — J449 Chronic obstructive pulmonary disease, unspecified: Secondary | ICD-10-CM | POA: Diagnosis not present

## 2017-04-12 DIAGNOSIS — G894 Chronic pain syndrome: Secondary | ICD-10-CM | POA: Insufficient documentation

## 2017-04-12 NOTE — Progress Notes (Signed)
Nursing Pain Medication Assessment:  Safety precautions to be maintained throughout the outpatient stay will include: orient to surroundings, keep bed in low position, maintain call bell within reach at all times, provide assistance with transfer out of bed and ambulation.  Medication Inspection Compliance: Carol Henderson did not comply with our request to bring her pills to be counted. She was reminded that bringing the medication bottles, even when empty, is a requirement.  Medication: None brought in. Pill/Patch Count: None available to be counted. Bottle Appearance: No container available. Did not bring bottle(s) to appointment. Filled Date: N/A Last Medication intake:  Ran out of medicine more than 48 hours ago

## 2017-04-12 NOTE — Progress Notes (Signed)
Safety precautions to be maintained throughout the outpatient stay will include: orient to surroundings, keep bed in low position, maintain call bell within reach at all times, provide assistance with transfer out of bed and ambulation.  

## 2017-04-12 NOTE — Progress Notes (Signed)
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 Deferred         Specialized Test(s): Deferred          Thoracic Spine Area Exam  Skin & Axial Inspection: No masses, redness, or swelling Alignment: Symmetrical Functional ROM: Unrestricted ROM Stability: No instability detected Muscle Tone/Strength: Functionally intact. No obvious neuro-muscular anomalies detected. Sensory (  Neurological): Unimpaired Muscle strength & Tone: No palpable anomalies  Lumbar Spine Area Exam  Skin & Axial Inspection: No masses, redness, or swelling Alignment: Symmetrical Functional ROM: Decreased ROM      Stability: No instability detected Muscle Tone/Strength: Functionally intact. No obvious neuro-muscular anomalies detected. Sensory (Neurological): Unimpaired Palpation: No palpable anomalies       Provocative Tests: Lumbar Hyperextension and rotation test: Positive       Lumbar Lateral bending test: Positive       Patrick's Maneuver: evaluation deferred today                    Gait & Posture Assessment  Ambulation: Unassisted Gait: Relatively normal  for age and body habitus Posture: WNL   Lower Extremity Exam    Side: Right lower extremity  Side: Left lower extremity  Skin & Extremity Inspection: Skin color, temperature, and hair growth are WNL. No peripheral edema or cyanosis. No masses, redness, swelling, asymmetry, or associated skin lesions. No contractures.  Skin & Extremity Inspection: Skin color, temperature, and hair growth are WNL. No peripheral edema or cyanosis. No masses, redness, swelling, asymmetry, or associated skin lesions. No contractures.  Functional ROM: Unrestricted ROM          Functional ROM: Unrestricted ROM          Muscle Tone/Strength: Functionally intact. No obvious neuro-muscular anomalies detected.  Muscle Tone/Strength: Functionally intact. No obvious neuro-muscular anomalies detected.  Sensory (Neurological): Unimpaired  Sensory (Neurological): Unimpaired  Palpation: No palpable anomalies  Palpation: No palpable anomalies   Assessment  Primary Diagnosis & Pertinent Problem List: The primary encounter diagnosis was Chronic pain syndrome. Diagnoses of Drug-seeking behavior, Lumbar degenerative disc disease, BORDERLINE PERSONALITY, and Cocaine abuse ([place]) were also pertinent to this visit.  Visit Diagnosis (New problems to examiner): 1. Chronic pain syndrome   2. Drug-seeking behavior   3. Lumbar degenerative disc disease   4. BORDERLINE PERSONALITY   5. Cocaine abuse ([place])    52 year old female who presents with chronic axial low back pain that radiates into bilateral buttocks and legs along with right shoulder pain, who is being referred from her primary care physician Dr. Manuella Ghazi, where she has been maintained on chronic opioid therapy with Percocet 5 mg 3 times daily as needed.  Patient also takes gabapentin 400 mg 3 times daily as needed.  Her chronic pain is largely secondary to myofascial pain syndrome, mild lumbar degenerative disc disease, remote history of bilateral sacral ala fractures.  I am  particularly concerned about the patient's psychiatric status and her history of bipolar disorder, borderline personality.  Patient's last intake of opioid medication was over a week ago.  Upon exam and conversation, it seems that the patient is going through opioid withdrawal.  I explained to her the symptoms of opioid withdrawal and told her that I could assist with clonidine, antihypertensives, muscle relaxants including tizanidine.  The patient is only interested in opioid therapy and became particularly disrespectful after I told her that opioid therapy would not be an option for her at this clinic for the following reasons: COPD/asthma, cardiovascular disease which increases her risk of respiratory depression with chronic opioid therapy; history of cocaine abuse in the past as documented in [facility] pain clinic note and patient's unwillingness in the past to see a pain psychologist regarding her addiction.  I am also concerned about the patient's mental health including her borderline personality, schizophrenia, bipolar disorder.  I do not believe that opioid analgesics are  a safe option for this patient and will not be prescribed at this clinic.  I informed the patient that I would attempt to support her through non-opioid analgesics, referral to psychotherapy, referral to physical therapy, interventional procedures when she is able to come off of her Brilinta but patient not interested.  When I attempted to prescribe her tizanidine and also a new prescription for gabapentin, patient became angry and frustrated with both myself and my staff and stated that she wanted to leave.  Patient left the visit before prescriptions were entered and before AVS was printed.  I am concerned about her drug-seeking behavior and dysfunctional emotional process and the patient may be a candidate for Suboxone therapy for her addiction disorder.  I recommend the patient see an addictionologist and/or a psychiatrist for her  psychiatric disease and try to get that optimized which will inevitably help her chronic pain condition and experience.  HIGH RISK OF OPIOID THERAPY- WILL NOT BE A CANDIDATE AT OUR CLINIC, PATIENT NOT INTERESTED IN ALTERNATIVE therapies including psychotherapy, physical therapy, changes in her medication regimen. Concern for drug-seeking behavior and addiction. Recommend patient see addiction medicine/ psychiatry.  Future Appointments  Date Time Provider [place]  06/09/2017 10:00 AM Lada, Satira Anis, MD [place] [facility]    Primary Care Physician: [place] Location: [facility] Note by: Gillis Santa, M.D, Date: 04/12/2017; Time: 12:19 PM  There are no Patient Instructions on file for this visit.

## 2017-04-13 ENCOUNTER — Encounter: Payer: Self-pay | Admitting: Family Medicine

## 2017-04-13 ENCOUNTER — Ambulatory Visit (INDEPENDENT_AMBULATORY_CARE_PROVIDER_SITE_OTHER): Payer: Medicare Other | Admitting: Family Medicine

## 2017-04-13 VITALS — BP 138/82 | HR 90 | Temp 98.4°F | Resp 14 | Wt 135.4 lb

## 2017-04-13 DIAGNOSIS — J0140 Acute pansinusitis, unspecified: Secondary | ICD-10-CM | POA: Diagnosis not present

## 2017-04-13 DIAGNOSIS — J449 Chronic obstructive pulmonary disease, unspecified: Secondary | ICD-10-CM

## 2017-04-13 DIAGNOSIS — M5416 Radiculopathy, lumbar region: Secondary | ICD-10-CM | POA: Diagnosis not present

## 2017-04-13 DIAGNOSIS — G8929 Other chronic pain: Secondary | ICD-10-CM

## 2017-04-13 DIAGNOSIS — I10 Essential (primary) hypertension: Secondary | ICD-10-CM

## 2017-04-13 MED ORDER — CLONIDINE HCL 0.1 MG PO TABS: 0.1000 mg | ORAL_TABLET | Freq: Two times a day (BID) | ORAL | 0 refills | Status: DC

## 2017-04-13 MED ORDER — AMOXICILLIN-POT CLAVULANATE 875-125 MG PO TABS: 1.0000 | ORAL_TABLET | Freq: Two times a day (BID) | ORAL | 0 refills | Status: DC

## 2017-04-13 MED ORDER — OXYCODONE-ACETAMINOPHEN 5-325 MG PO TABS: 1.0000 | ORAL_TABLET | Freq: Every day | ORAL | 0 refills | Status: DC | PRN

## 2017-04-13 MED ORDER — ALBUTEROL SULFATE HFA 108 (90 BASE) MCG/ACT IN AERS: 2.0000 | INHALATION_SPRAY | Freq: Four times a day (QID) | RESPIRATORY_TRACT | 2 refills | Status: DC | PRN
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
## 2017-04-13 NOTE — Telephone Encounter (Signed)
Called pt and she already schedule an apt for today to speak to Dr. Manuella Ghazi regarding her concerns with her back and cold

## 2017-04-14 DIAGNOSIS — I208 Other forms of angina pectoris: Secondary | ICD-10-CM | POA: Diagnosis not present

## 2017-04-14 DIAGNOSIS — Z955 Presence of coronary angioplasty implant and graft: Secondary | ICD-10-CM | POA: Diagnosis not present

## 2017-04-14 DIAGNOSIS — I2510 Atherosclerotic heart disease of native coronary artery without angina pectoris: Secondary | ICD-10-CM | POA: Diagnosis not present

## 2017-04-14 DIAGNOSIS — R0602 Shortness of breath: Secondary | ICD-10-CM | POA: Diagnosis not present

## 2017-04-14 DIAGNOSIS — R011 Cardiac murmur, unspecified: Secondary | ICD-10-CM | POA: Diagnosis not present

## 2017-04-21 ENCOUNTER — Encounter: Payer: Self-pay | Admitting: *Deleted

## 2017-04-21 ENCOUNTER — Encounter
Admission: RE | Disposition: A | Discharge status: Home / Self Care | Payer: Self-pay | Source: Ambulatory Visit | Attending: Internal Medicine

## 2017-04-21 ENCOUNTER — Ambulatory Visit
Admission: RE | Admit: 2017-04-21 | Discharge: 2017-04-21 | Disposition: A | Discharge status: Home / Self Care | Payer: Medicare Other | Source: Ambulatory Visit | Attending: Internal Medicine | Admitting: Internal Medicine

## 2017-04-21 DIAGNOSIS — Z8249 Family history of ischemic heart disease and other diseases of the circulatory system: Secondary | ICD-10-CM | POA: Insufficient documentation

## 2017-04-21 DIAGNOSIS — F17210 Nicotine dependence, cigarettes, uncomplicated: Secondary | ICD-10-CM | POA: Diagnosis not present

## 2017-04-21 DIAGNOSIS — I10 Essential (primary) hypertension: Secondary | ICD-10-CM | POA: Diagnosis not present

## 2017-04-21 DIAGNOSIS — Z7982 Long term (current) use of aspirin: Secondary | ICD-10-CM | POA: Diagnosis not present

## 2017-04-21 DIAGNOSIS — R079 Chest pain, unspecified: Secondary | ICD-10-CM

## 2017-04-21 DIAGNOSIS — Z79899 Other long term (current) drug therapy: Secondary | ICD-10-CM | POA: Insufficient documentation

## 2017-04-21 DIAGNOSIS — Z923 Personal history of irradiation: Secondary | ICD-10-CM | POA: Insufficient documentation

## 2017-04-21 DIAGNOSIS — I200 Unstable angina: Secondary | ICD-10-CM | POA: Diagnosis not present

## 2017-04-21 DIAGNOSIS — Z7902 Long term (current) use of antithrombotics/antiplatelets: Secondary | ICD-10-CM | POA: Diagnosis not present

## 2017-04-21 DIAGNOSIS — G8929 Other chronic pain: Secondary | ICD-10-CM | POA: Insufficient documentation

## 2017-04-21 DIAGNOSIS — K219 Gastro-esophageal reflux disease without esophagitis: Secondary | ICD-10-CM | POA: Diagnosis not present

## 2017-04-21 DIAGNOSIS — Z8589 Personal history of malignant neoplasm of other organs and systems: Secondary | ICD-10-CM | POA: Diagnosis not present

## 2017-04-21 DIAGNOSIS — I2510 Atherosclerotic heart disease of native coronary artery without angina pectoris: Secondary | ICD-10-CM | POA: Diagnosis not present

## 2017-04-21 DIAGNOSIS — Z9221 Personal history of antineoplastic chemotherapy: Secondary | ICD-10-CM | POA: Insufficient documentation

## 2017-04-21 DIAGNOSIS — I252 Old myocardial infarction: Secondary | ICD-10-CM | POA: Insufficient documentation

## 2017-04-21 DIAGNOSIS — Z791 Long term (current) use of non-steroidal anti-inflammatories (NSAID): Secondary | ICD-10-CM | POA: Diagnosis not present

## 2017-04-21 DIAGNOSIS — J0140 Acute pansinusitis, unspecified: Secondary | ICD-10-CM | POA: Insufficient documentation

## 2017-04-21 DIAGNOSIS — J449 Chronic obstructive pulmonary disease, unspecified: Secondary | ICD-10-CM | POA: Insufficient documentation

## 2017-04-21 DIAGNOSIS — Z955 Presence of coronary angioplasty implant and graft: Secondary | ICD-10-CM | POA: Diagnosis not present

## 2017-04-21 DIAGNOSIS — M545 Low back pain: Secondary | ICD-10-CM | POA: Insufficient documentation

## 2017-04-21 HISTORY — PX: LEFT HEART CATH AND CORONARY ANGIOGRAPHY: CATH118249

## 2017-04-21 LAB — CARDIAC CATHETERIZATION: CATHEFQUANT: 55 %

## 2017-04-21 SURGERY — LEFT HEART CATH AND CORONARY ANGIOGRAPHY
Anesthesia: Moderate Sedation | Laterality: Left

## 2017-04-21 SURGERY — LEFT HEART CATH AND CORONARY ANGIOGRAPHY
Anesthesia: Moderate Sedation

## 2017-04-21 MED ORDER — SODIUM CHLORIDE 0.9% FLUSH: 3.0000 mL | INTRAVENOUS | Status: DC | PRN

## 2017-04-21 MED ORDER — SODIUM CHLORIDE 0.9 % IV SOLN: 250.0000 mL | INTRAVENOUS | Status: DC | PRN

## 2017-04-21 MED ORDER — SODIUM CHLORIDE 0.9% FLUSH: 3.0000 mL | Freq: Two times a day (BID) | INTRAVENOUS | Status: DC

## 2017-04-21 MED ORDER — LABETALOL HCL 5 MG/ML IV SOLN
INTRAVENOUS | Status: DC | PRN
  Administered 2017-04-21: 20 mg via INTRAVENOUS

## 2017-04-21 MED ORDER — SODIUM CHLORIDE 0.9 % WEIGHT BASED INFUSION: 1.0000 mL/kg/h | INTRAVENOUS | Status: DC

## 2017-04-21 MED ORDER — FENTANYL CITRATE (PF) 100 MCG/2ML IJ SOLN
INTRAMUSCULAR | Status: DC | PRN
  Administered 2017-04-21 (×2): 25 ug via INTRAVENOUS

## 2017-04-21 MED ORDER — ONDANSETRON HCL 4 MG/2ML IJ SOLN: 4.0000 mg | Freq: Four times a day (QID) | INTRAMUSCULAR | Status: DC | PRN

## 2017-04-21 MED ORDER — ASPIRIN 81 MG PO CHEW: 81.0000 mg | CHEWABLE_TABLET | ORAL | Status: DC

## 2017-04-21 MED ORDER — LABETALOL HCL 5 MG/ML IV SOLN
INTRAVENOUS | Status: AC
  Filled 2017-04-21: qty 4

## 2017-04-21 MED ORDER — MIDAZOLAM HCL 2 MG/2ML IJ SOLN
INTRAMUSCULAR | Status: AC
Start: 2017-04-21 — End: 2017-04-21
  Filled 2017-04-21: qty 2

## 2017-04-21 MED ORDER — ACETAMINOPHEN 325 MG PO TABS: 650.0000 mg | ORAL_TABLET | ORAL | Status: DC | PRN

## 2017-04-21 MED ORDER — IOPAMIDOL (ISOVUE-300) INJECTION 61%
INTRAVENOUS | Status: DC | PRN
  Administered 2017-04-21: 130 mL via INTRA_ARTERIAL

## 2017-04-21 MED ORDER — LABETALOL HCL 5 MG/ML IV SOLN
20.0000 mg | Freq: Once | INTRAVENOUS | Status: AC
  Administered 2017-04-21: 20 mg via INTRAVENOUS

## 2017-04-21 MED ORDER — MIDAZOLAM HCL 2 MG/2ML IJ SOLN
INTRAMUSCULAR | Status: DC | PRN
  Administered 2017-04-21 (×2): 1 mg via INTRAVENOUS

## 2017-04-21 MED ORDER — SODIUM CHLORIDE 0.9 % WEIGHT BASED INFUSION
1.0000 mL/kg/h | INTRAVENOUS | Status: DC
  Administered 2017-04-21: 1 mL/kg/h via INTRAVENOUS

## 2017-04-21 MED ORDER — SODIUM CHLORIDE 0.9 % WEIGHT BASED INFUSION
3.0000 mL/kg/h | INTRAVENOUS | Status: AC
  Administered 2017-04-21: 3 mL/kg/h via INTRAVENOUS

## 2017-04-21 MED ORDER — FENTANYL CITRATE (PF) 100 MCG/2ML IJ SOLN
INTRAMUSCULAR | Status: AC
  Filled 2017-04-21: qty 2

## 2017-04-21 SURGICAL SUPPLY — 9 items
CATH INFINITI 5FR ANG PIGTAIL (CATHETERS) ×2 IMPLANT
CATH INFINITI 5FR JL4 (CATHETERS) ×2 IMPLANT
CATH INFINITI JR4 5F (CATHETERS) ×2 IMPLANT
KIT MANI 3VAL PERCEP (MISCELLANEOUS) ×3 IMPLANT
NDL PERC 18GX7CM (NEEDLE) IMPLANT
NEEDLE PERC 18GX7CM (NEEDLE) ×3 IMPLANT
PACK CARDIAC CATH (CUSTOM PROCEDURE TRAY) ×3 IMPLANT
SHEATH AVANTI 5FR X 11CM (SHEATH) ×2 IMPLANT
WIRE GUIDERIGHT .035X150 (WIRE) ×2 IMPLANT

## 2017-04-21 NOTE — Progress Notes (Signed)
No changes at this time,. Vitals remain stable without complaints. No bleeding nor hematoma to right groin site.

## 2017-04-21 NOTE — Progress Notes (Signed)
Patient remains clinically stable post heart cath per Dr Clayborn Bigness, right groin without bleeding nor hematoma. Vitals stable at this time. Dr Clayborn Bigness out to speak with patient and mother with questions answered.denies complaints. Sinus rhythm per monitor. Discharge instructions given with questions answered.

## 2017-04-21 NOTE — Discharge Instructions (Signed)

## 2017-04-22 ENCOUNTER — Encounter: Payer: Self-pay | Admitting: Internal Medicine

## 2017-05-17 NOTE — Telephone Encounter (Signed)
Visit complete.

## 2017-06-08 ENCOUNTER — Other Ambulatory Visit: Payer: Self-pay | Admitting: Family Medicine

## 2017-06-08 DIAGNOSIS — G63 Polyneuropathy in diseases classified elsewhere: Principal | ICD-10-CM

## 2017-06-08 DIAGNOSIS — C801 Malignant (primary) neoplasm, unspecified: Secondary | ICD-10-CM

## 2017-06-08 NOTE — Telephone Encounter (Signed)
Copied from [place] [id number]. Topic: Quick Communication - See Telephone Encounter >> Jun 08, 2017  4:58 PM Ivar Drape wrote: CRM for notification. See Telephone encounter for:  06/08/17. Patient stated her pharmacy [facility] went out of business and her prescriptions needs to be sent to [facility] on [address].  She stated all of her prescriptions were transferred over except her gabapentin (NEURONTIN) 400 MG capsule medication.  [facility] is waiting for that prescription to be sent over.

## 2017-06-09 ENCOUNTER — Ambulatory Visit: Payer: Medicare Other | Admitting: Family Medicine

## 2017-06-09 NOTE — Telephone Encounter (Signed)
Based on refills and quantity pt should have run out in November. Please advise

## 2017-06-09 NOTE — Telephone Encounter (Signed)
She needs an appointment.

## 2017-06-09 NOTE — Telephone Encounter (Signed)
Pt no showed for appt on 06/09/2017.

## 2017-07-15 ENCOUNTER — Encounter: Payer: Medicare Other | Admitting: Family Medicine

## 2017-07-22 ENCOUNTER — Telehealth: Payer: Self-pay | Admitting: Family Medicine

## 2017-07-22 ENCOUNTER — Other Ambulatory Visit: Payer: Self-pay

## 2017-07-22 DIAGNOSIS — C801 Malignant (primary) neoplasm, unspecified: Secondary | ICD-10-CM

## 2017-07-22 DIAGNOSIS — G63 Polyneuropathy in diseases classified elsewhere: Principal | ICD-10-CM

## 2017-07-22 NOTE — Telephone Encounter (Signed)
Patient has had two no shows in a row Based on prescription hx, it appears that she would have run out of this medicine in November 2018 If she needs this medicine and it's for neuropathy associated with cancer, recommend to her that she contact her cancer doctor for further prescriptions of gabapentin

## 2017-07-22 NOTE — Telephone Encounter (Signed)
Spoke with pt she is going to have to find new PCP who her ins will cover

## 2017-07-22 NOTE — Telephone Encounter (Signed)
Copied from [place] [id number]. Topic: Quick Communication - Rx Refill/Question >> Jul 22, 2017 12:09 PM Selinda Flavin B, NT wrote: Medication: gabapentin (NEURONTIN) 400 MG capsule Has the patient contacted their pharmacy? Yes.   (Agent: If no, request that the patient contact the pharmacy for the refill.) Preferred Pharmacy (with phone number or street name): [id number] [place] [place] - [address] Agent: Please be advised that RX refills may take up to 3 business days. We ask that you follow-up with your pharmacy.  Patient called and states that she needs her gabapentin. Informed patient that Dr. Sanda Klein would like her to make an appointment. She had 2 previous appointments, but no showed for those. States that her insurance does not cover her visits with the office anymore. Please advise. CB#: [phone]

## 2017-07-22 NOTE — Telephone Encounter (Signed)
Left detailed voicemail

## 2017-08-23 ENCOUNTER — Emergency Department
Admission: EM | Admit: 2017-08-23 | Discharge: 2017-08-23 | Disposition: A | Discharge status: Home / Self Care | Payer: Medicare Other | Attending: Emergency Medicine | Admitting: Emergency Medicine

## 2017-08-23 ENCOUNTER — Encounter: Payer: Self-pay | Admitting: Emergency Medicine

## 2017-08-23 ENCOUNTER — Emergency Department: Payer: Medicare Other

## 2017-08-23 DIAGNOSIS — J45909 Unspecified asthma, uncomplicated: Secondary | ICD-10-CM | POA: Insufficient documentation

## 2017-08-23 DIAGNOSIS — I2510 Atherosclerotic heart disease of native coronary artery without angina pectoris: Secondary | ICD-10-CM | POA: Insufficient documentation

## 2017-08-23 DIAGNOSIS — J449 Chronic obstructive pulmonary disease, unspecified: Secondary | ICD-10-CM | POA: Insufficient documentation

## 2017-08-23 DIAGNOSIS — F17210 Nicotine dependence, cigarettes, uncomplicated: Secondary | ICD-10-CM | POA: Insufficient documentation

## 2017-08-23 DIAGNOSIS — R202 Paresthesia of skin: Secondary | ICD-10-CM | POA: Insufficient documentation

## 2017-08-23 DIAGNOSIS — Z923 Personal history of irradiation: Secondary | ICD-10-CM | POA: Diagnosis not present

## 2017-08-23 DIAGNOSIS — Z8544 Personal history of malignant neoplasm of other female genital organs: Secondary | ICD-10-CM | POA: Insufficient documentation

## 2017-08-23 DIAGNOSIS — M25561 Pain in right knee: Secondary | ICD-10-CM | POA: Diagnosis present

## 2017-08-23 DIAGNOSIS — Z79899 Other long term (current) drug therapy: Secondary | ICD-10-CM | POA: Insufficient documentation

## 2017-08-23 DIAGNOSIS — I252 Old myocardial infarction: Secondary | ICD-10-CM | POA: Diagnosis not present

## 2017-08-23 DIAGNOSIS — Z9221 Personal history of antineoplastic chemotherapy: Secondary | ICD-10-CM | POA: Insufficient documentation

## 2017-08-23 DIAGNOSIS — I10 Essential (primary) hypertension: Secondary | ICD-10-CM | POA: Insufficient documentation

## 2017-08-23 MED ORDER — MELOXICAM 15 MG PO TABS: 15.0000 mg | ORAL_TABLET | Freq: Every day | ORAL | 2 refills | Status: DC

## 2017-08-23 MED ORDER — KETOROLAC TROMETHAMINE 30 MG/ML IJ SOLN
30.0000 mg | Freq: Once | INTRAMUSCULAR | Status: AC
  Administered 2017-08-23: 30 mg via INTRAMUSCULAR
  Filled 2017-08-23: qty 1
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
## 2017-08-23 NOTE — ED Triage Notes (Signed)
Pt c/o right knee pain x1 week. Pt denies injury. No deformity noted.

## 2017-08-23 NOTE — Discharge Instructions (Signed)
Follow-up with Dr. Roland Rack at [facility] if you are not better in 7 to 10 days.  He would need to call make an appointment.  Apply ice to the knee.  Use the crutches to avoid bearing weight if it is painful.  Wear the knee immobilizer for the next 3 to 5 days.  Return to emergency department if worsening

## 2017-10-26 ENCOUNTER — Other Ambulatory Visit: Payer: Self-pay | Admitting: Internal Medicine

## 2017-10-26 DIAGNOSIS — Z1231 Encounter for screening mammogram for malignant neoplasm of breast: Secondary | ICD-10-CM

## 2017-11-28 ENCOUNTER — Ambulatory Visit
Admission: RE | Admit: 2017-11-28 | Discharge: 2017-11-28 | Disposition: A | Discharge status: Home / Self Care | Payer: Medicare Other | Source: Ambulatory Visit | Attending: Internal Medicine | Admitting: Internal Medicine

## 2017-11-28 DIAGNOSIS — Z1231 Encounter for screening mammogram for malignant neoplasm of breast: Secondary | ICD-10-CM | POA: Diagnosis not present

## 2017-12-01 ENCOUNTER — Other Ambulatory Visit: Payer: Self-pay | Admitting: Internal Medicine

## 2017-12-01 DIAGNOSIS — R928 Other abnormal and inconclusive findings on diagnostic imaging of breast: Secondary | ICD-10-CM

## 2017-12-07 ENCOUNTER — Other Ambulatory Visit: Payer: Medicare Other

## 2017-12-07 ENCOUNTER — Ambulatory Visit: Payer: Medicare Other | Attending: Internal Medicine

## 2017-12-21 ENCOUNTER — Ambulatory Visit
Admission: RE | Admit: 2017-12-21 | Discharge: 2017-12-21 | Disposition: A | Discharge status: Home / Self Care | Payer: Medicare Other | Source: Ambulatory Visit | Attending: Internal Medicine | Admitting: Internal Medicine

## 2017-12-21 DIAGNOSIS — R928 Other abnormal and inconclusive findings on diagnostic imaging of breast: Secondary | ICD-10-CM | POA: Insufficient documentation

## 2018-04-24 ENCOUNTER — Other Ambulatory Visit: Payer: Self-pay

## 2018-04-24 ENCOUNTER — Encounter: Payer: Self-pay | Admitting: Emergency Medicine

## 2018-04-24 ENCOUNTER — Emergency Department: Payer: Medicare Other

## 2018-04-24 ENCOUNTER — Inpatient Hospital Stay
Admission: EM | Admit: 2018-04-24 | Discharge: 2018-04-27 | DRG: 190 | Disposition: A | Discharge status: Home / Self Care | Payer: Medicare Other | Attending: Internal Medicine | Admitting: Internal Medicine

## 2018-04-24 DIAGNOSIS — E876 Hypokalemia: Secondary | ICD-10-CM | POA: Diagnosis present

## 2018-04-24 DIAGNOSIS — F17210 Nicotine dependence, cigarettes, uncomplicated: Secondary | ICD-10-CM | POA: Diagnosis present

## 2018-04-24 DIAGNOSIS — J440 Chronic obstructive pulmonary disease with acute lower respiratory infection: Secondary | ICD-10-CM | POA: Diagnosis present

## 2018-04-24 DIAGNOSIS — F319 Bipolar disorder, unspecified: Secondary | ICD-10-CM | POA: Diagnosis present

## 2018-04-24 DIAGNOSIS — G8929 Other chronic pain: Secondary | ICD-10-CM | POA: Diagnosis present

## 2018-04-24 DIAGNOSIS — Z8249 Family history of ischemic heart disease and other diseases of the circulatory system: Secondary | ICD-10-CM

## 2018-04-24 DIAGNOSIS — Z7982 Long term (current) use of aspirin: Secondary | ICD-10-CM

## 2018-04-24 DIAGNOSIS — R0789 Other chest pain: Secondary | ICD-10-CM | POA: Diagnosis not present

## 2018-04-24 DIAGNOSIS — R52 Pain, unspecified: Secondary | ICD-10-CM

## 2018-04-24 DIAGNOSIS — I2510 Atherosclerotic heart disease of native coronary artery without angina pectoris: Secondary | ICD-10-CM | POA: Diagnosis present

## 2018-04-24 DIAGNOSIS — Z23 Encounter for immunization: Secondary | ICD-10-CM

## 2018-04-24 DIAGNOSIS — R918 Other nonspecific abnormal finding of lung field: Secondary | ICD-10-CM | POA: Diagnosis present

## 2018-04-24 DIAGNOSIS — K219 Gastro-esophageal reflux disease without esophagitis: Secondary | ICD-10-CM | POA: Diagnosis present

## 2018-04-24 DIAGNOSIS — Z8544 Personal history of malignant neoplasm of other female genital organs: Secondary | ICD-10-CM

## 2018-04-24 DIAGNOSIS — F4481 Dissociative identity disorder: Secondary | ICD-10-CM | POA: Diagnosis present

## 2018-04-24 DIAGNOSIS — E860 Dehydration: Secondary | ICD-10-CM | POA: Diagnosis present

## 2018-04-24 DIAGNOSIS — Z7951 Long term (current) use of inhaled steroids: Secondary | ICD-10-CM

## 2018-04-24 DIAGNOSIS — B182 Chronic viral hepatitis C: Secondary | ICD-10-CM | POA: Diagnosis present

## 2018-04-24 DIAGNOSIS — E782 Mixed hyperlipidemia: Secondary | ICD-10-CM | POA: Diagnosis present

## 2018-04-24 DIAGNOSIS — M79606 Pain in leg, unspecified: Secondary | ICD-10-CM | POA: Diagnosis present

## 2018-04-24 DIAGNOSIS — Z79899 Other long term (current) drug therapy: Secondary | ICD-10-CM

## 2018-04-24 DIAGNOSIS — Z803 Family history of malignant neoplasm of breast: Secondary | ICD-10-CM

## 2018-04-24 DIAGNOSIS — J441 Chronic obstructive pulmonary disease with (acute) exacerbation: Secondary | ICD-10-CM | POA: Diagnosis not present

## 2018-04-24 DIAGNOSIS — K8020 Calculus of gallbladder without cholecystitis without obstruction: Secondary | ICD-10-CM | POA: Diagnosis present

## 2018-04-24 DIAGNOSIS — Z955 Presence of coronary angioplasty implant and graft: Secondary | ICD-10-CM

## 2018-04-24 DIAGNOSIS — Z888 Allergy status to other drugs, medicaments and biological substances status: Secondary | ICD-10-CM

## 2018-04-24 DIAGNOSIS — K2970 Gastritis, unspecified, without bleeding: Secondary | ICD-10-CM | POA: Diagnosis present

## 2018-04-24 DIAGNOSIS — M79603 Pain in arm, unspecified: Secondary | ICD-10-CM | POA: Diagnosis present

## 2018-04-24 DIAGNOSIS — F419 Anxiety disorder, unspecified: Secondary | ICD-10-CM | POA: Diagnosis present

## 2018-04-24 DIAGNOSIS — F209 Schizophrenia, unspecified: Secondary | ICD-10-CM | POA: Diagnosis present

## 2018-04-24 DIAGNOSIS — I10 Essential (primary) hypertension: Secondary | ICD-10-CM | POA: Diagnosis present

## 2018-04-24 DIAGNOSIS — R079 Chest pain, unspecified: Secondary | ICD-10-CM | POA: Diagnosis present

## 2018-04-24 DIAGNOSIS — I161 Hypertensive emergency: Secondary | ICD-10-CM

## 2018-04-24 DIAGNOSIS — J449 Chronic obstructive pulmonary disease, unspecified: Secondary | ICD-10-CM | POA: Diagnosis present

## 2018-04-24 DIAGNOSIS — J189 Pneumonia, unspecified organism: Secondary | ICD-10-CM | POA: Diagnosis present

## 2018-04-24 DIAGNOSIS — I252 Old myocardial infarction: Secondary | ICD-10-CM

## 2018-04-24 DIAGNOSIS — Z791 Long term (current) use of non-steroidal anti-inflammatories (NSAID): Secondary | ICD-10-CM

## 2018-04-24 LAB — CBC
HCT: 43.8 % (ref 36.0–46.0)
Hemoglobin: 14.7 g/dL (ref 12.0–15.0)
MCH: 30.9 pg (ref 26.0–34.0)
MCHC: 33.6 g/dL (ref 30.0–36.0)
MCV: 92 fL (ref 80.0–100.0)
Platelets: 365 10*3/uL (ref 150–400)
RBC: 4.76 MIL/uL (ref 3.87–5.11)
RDW: 13 % (ref 11.5–15.5)
WBC: 17.2 10*3/uL — ABNORMAL HIGH (ref 4.0–10.5)
nRBC: 0 % (ref 0.0–0.2)

## 2018-04-24 LAB — COMPREHENSIVE METABOLIC PANEL
ALT: 17 U/L (ref 0–44)
AST: 19 U/L (ref 15–41)
Albumin: 3.9 g/dL (ref 3.5–5.0)
Alkaline Phosphatase: 98 U/L (ref 38–126)
Anion gap: 6 (ref 5–15)
BUN: 25 mg/dL — ABNORMAL HIGH (ref 6–20)
CO2: 25 mmol/L (ref 22–32)
Calcium: 8.7 mg/dL — ABNORMAL LOW (ref 8.9–10.3)
Chloride: 108 mmol/L (ref 98–111)
Creatinine, Ser: 1.16 mg/dL — ABNORMAL HIGH (ref 0.44–1.00)
GFR calc non Af Amer: 54 mL/min — ABNORMAL LOW (ref >60)
Glucose, Bld: 119 mg/dL — ABNORMAL HIGH (ref 70–99)
Potassium: 3.4 mmol/L — ABNORMAL LOW (ref 3.5–5.1)
Sodium: 139 mmol/L (ref 135–145)
Total Bilirubin: 0.4 mg/dL (ref 0.3–1.2)
Total Protein: 7.7 g/dL (ref 6.5–8.1)

## 2018-04-24 LAB — APTT: aPTT: 32 seconds (ref 24–36)

## 2018-04-24 LAB — TROPONIN I
Troponin I: <0.03 ng/mL (ref <0.03)
Troponin I: <0.03 ng/mL (ref <0.03)

## 2018-04-24 LAB — PROTIME-INR
INR: 0.92
Prothrombin Time: 12.3 seconds (ref 11.4–15.2)

## 2018-04-24 MED ORDER — ENOXAPARIN SODIUM 40 MG/0.4ML ~~LOC~~ SOLN
40.0000 mg | SUBCUTANEOUS | Status: DC
  Administered 2018-04-24 – 2018-04-26 (×3): 40 mg via SUBCUTANEOUS
  Filled 2018-04-24 (×3): qty 0.4

## 2018-04-24 MED ORDER — DOXYCYCLINE HYCLATE 100 MG PO TABS
100.0000 mg | ORAL_TABLET | Freq: Once | ORAL | Status: AC
  Administered 2018-04-24: 100 mg via ORAL
  Filled 2018-04-24: qty 1

## 2018-04-24 MED ORDER — ONDANSETRON HCL 4 MG/2ML IJ SOLN
4.0000 mg | Freq: Four times a day (QID) | INTRAMUSCULAR | Status: DC | PRN
  Administered 2018-04-24: 4 mg via INTRAVENOUS
  Filled 2018-04-24: qty 2

## 2018-04-24 MED ORDER — INFLUENZA VAC SPLIT QUAD 0.5 ML IM SUSY
0.5000 mL | PREFILLED_SYRINGE | INTRAMUSCULAR | Status: AC
  Administered 2018-04-27: 0.5 mL via INTRAMUSCULAR
  Filled 2018-04-24: qty 0.5

## 2018-04-24 MED ORDER — MORPHINE SULFATE (PF) 4 MG/ML IV SOLN
4.0000 mg | Freq: Once | INTRAVENOUS | Status: AC
  Administered 2018-04-24: 4 mg via INTRAVENOUS
  Filled 2018-04-24: qty 1

## 2018-04-24 MED ORDER — ALUM & MAG HYDROXIDE-SIMETH 200-200-20 MG/5ML PO SUSP
30.0000 mL | Freq: Once | ORAL | Status: AC
  Administered 2018-04-24: 30 mL via ORAL
  Filled 2018-04-24: qty 30

## 2018-04-24 MED ORDER — TICAGRELOR 90 MG PO TABS
90.0000 mg | ORAL_TABLET | Freq: Two times a day (BID) | ORAL | Status: DC
  Administered 2018-04-24 – 2018-04-27 (×6): 90 mg via ORAL
  Filled 2018-04-24 (×6): qty 1

## 2018-04-24 MED ORDER — ATORVASTATIN CALCIUM 20 MG PO TABS
40.0000 mg | ORAL_TABLET | Freq: Every day | ORAL | Status: DC
  Administered 2018-04-25 – 2018-04-26 (×2): 40 mg via ORAL
  Filled 2018-04-24 (×2): qty 2

## 2018-04-24 MED ORDER — GUAIFENESIN-DM 100-10 MG/5ML PO SYRP: 5.0000 mL | ORAL_SOLUTION | ORAL | Status: DC | PRN

## 2018-04-24 MED ORDER — ZOLPIDEM TARTRATE 5 MG PO TABS
5.0000 mg | ORAL_TABLET | Freq: Every evening | ORAL | Status: DC | PRN
  Administered 2018-04-24 – 2018-04-26 (×3): 5 mg via ORAL
  Filled 2018-04-24 (×4): qty 1

## 2018-04-24 MED ORDER — NITROGLYCERIN 2 % TD OINT
1.0000 [in_us] | TOPICAL_OINTMENT | TRANSDERMAL | Status: AC
  Administered 2018-04-24: 1 [in_us] via TOPICAL
  Filled 2018-04-24: qty 1

## 2018-04-24 MED ORDER — PANTOPRAZOLE SODIUM 40 MG PO TBEC
40.0000 mg | DELAYED_RELEASE_TABLET | Freq: Every day | ORAL | Status: DC
  Administered 2018-04-24 – 2018-04-27 (×4): 40 mg via ORAL
  Filled 2018-04-24 (×4): qty 1

## 2018-04-24 MED ORDER — NITROGLYCERIN 0.4 MG SL SUBL
0.4000 mg | SUBLINGUAL_TABLET | SUBLINGUAL | Status: DC | PRN
  Administered 2018-04-24 (×2): 0.4 mg via SUBLINGUAL
  Filled 2018-04-24 (×2): qty 1

## 2018-04-24 MED ORDER — MORPHINE SULFATE (PF) 2 MG/ML IV SOLN
2.0000 mg | INTRAVENOUS | Status: DC | PRN
  Administered 2018-04-24 – 2018-04-26 (×7): 2 mg via INTRAVENOUS
  Filled 2018-04-24 (×7): qty 1

## 2018-04-24 MED ORDER — SODIUM CHLORIDE 0.9 % IV SOLN
1.0000 g | INTRAVENOUS | Status: DC
  Administered 2018-04-25: 1 g via INTRAVENOUS
  Filled 2018-04-24: qty 10
  Filled 2018-04-24: qty 1

## 2018-04-24 MED ORDER — SODIUM CHLORIDE 0.9 % IV SOLN
500.0000 mg | INTRAVENOUS | Status: AC
  Administered 2018-04-25: 500 mg via INTRAVENOUS
  Filled 2018-04-24: qty 500

## 2018-04-24 MED ORDER — IOHEXOL 350 MG/ML SOLN
75.0000 mL | Freq: Once | INTRAVENOUS | Status: AC | PRN
  Administered 2018-04-24: 75 mL via INTRAVENOUS

## 2018-04-24 MED ORDER — ALPRAZOLAM 0.25 MG PO TABS
0.2500 mg | ORAL_TABLET | Freq: Two times a day (BID) | ORAL | Status: DC | PRN
  Administered 2018-04-25 – 2018-04-26 (×3): 0.25 mg via ORAL
  Filled 2018-04-24 (×3): qty 1

## 2018-04-24 MED ORDER — CLONIDINE HCL 0.1 MG PO TABS
0.1000 mg | ORAL_TABLET | Freq: Two times a day (BID) | ORAL | Status: DC
  Administered 2018-04-25 (×2): 0.1 mg via ORAL
  Filled 2018-04-24 (×2): qty 1

## 2018-04-24 MED ORDER — POTASSIUM CHLORIDE CRYS ER 20 MEQ PO TBCR
40.0000 meq | EXTENDED_RELEASE_TABLET | Freq: Once | ORAL | Status: AC
  Administered 2018-04-24: 40 meq via ORAL
  Filled 2018-04-24: qty 2

## 2018-04-24 MED ORDER — SODIUM CHLORIDE 0.9% FLUSH
3.0000 mL | Freq: Once | INTRAVENOUS | Status: AC
  Administered 2018-04-24: 3 mL via INTRAVENOUS

## 2018-04-24 MED ORDER — ACETAMINOPHEN 325 MG PO TABS: 650.0000 mg | ORAL_TABLET | ORAL | Status: DC | PRN

## 2018-04-24 MED ORDER — ONDANSETRON HCL 4 MG/2ML IJ SOLN
4.0000 mg | Freq: Once | INTRAMUSCULAR | Status: AC
  Administered 2018-04-24: 4 mg via INTRAVENOUS
  Filled 2018-04-24: qty 2

## 2018-04-24 MED ORDER — GABAPENTIN 400 MG PO CAPS
400.0000 mg | ORAL_CAPSULE | Freq: Three times a day (TID) | ORAL | Status: DC
  Administered 2018-04-24 – 2018-04-27 (×8): 400 mg via ORAL
  Filled 2018-04-24 (×7): qty 1

## 2018-04-24 MED ORDER — CLONIDINE HCL 0.1 MG PO TABS
0.1000 mg | ORAL_TABLET | Freq: Once | ORAL | Status: AC
  Administered 2018-04-24: 0.1 mg via ORAL
  Filled 2018-04-24: qty 1

## 2018-04-24 MED ORDER — SODIUM CHLORIDE 0.9 % IV SOLN
2.0000 g | Freq: Once | INTRAVENOUS | Status: AC
  Administered 2018-04-24: 2 g via INTRAVENOUS
  Filled 2018-04-24: qty 20

## 2018-04-24 MED ORDER — ASPIRIN EC 81 MG PO TBEC: 81.0000 mg | DELAYED_RELEASE_TABLET | Freq: Four times a day (QID) | ORAL | Status: DC | PRN

## 2018-04-24 MED ORDER — TIOTROPIUM BROMIDE MONOHYDRATE 18 MCG IN CAPS
18.0000 ug | ORAL_CAPSULE | Freq: Every day | RESPIRATORY_TRACT | Status: DC
  Administered 2018-04-25 – 2018-04-27 (×3): 18 ug via RESPIRATORY_TRACT
  Filled 2018-04-24: qty 5

## 2018-04-24 MED ORDER — LIDOCAINE VISCOUS HCL 2 % MT SOLN
15.0000 mL | Freq: Once | OROMUCOSAL | Status: AC
  Administered 2018-04-24: 15 mL via ORAL
  Filled 2018-04-24 (×2): qty 15

## 2018-04-24 NOTE — ED Triage Notes (Signed)
First Nurse Note:  C/O intermittent chest pain x 3 days.  States pain radiates intermittently down right arm.

## 2018-04-24 NOTE — ED Notes (Signed)
This RN attempted to call report to the floor. Floor RN informed this RN that they could not take report at this time due to multiple D/C and admissions at this time. This RN notified ER charge nurse steven at this time.

## 2018-04-24 NOTE — ED Triage Notes (Signed)
Chest pain x 3 days. Denies fall or injury. SOB started today.
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
## 2018-04-24 NOTE — ED Notes (Signed)
Pt appears to be uncomfortable in bed. Pt changing positions in bed and stating to this nurse "I just cannot get comfortable, I am in so much pain". MD aware.

## 2018-04-24 NOTE — ED Notes (Signed)
This RN attempted to call report at this time. Charge nurse Lexi informed this RN to wait 5 minutes due to her discharging a pt at this time.
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
## 2018-04-25 ENCOUNTER — Observation Stay: Payer: Medicare Other

## 2018-04-25 DIAGNOSIS — M79606 Pain in leg, unspecified: Secondary | ICD-10-CM | POA: Diagnosis present

## 2018-04-25 DIAGNOSIS — F4481 Dissociative identity disorder: Secondary | ICD-10-CM | POA: Diagnosis present

## 2018-04-25 DIAGNOSIS — K8020 Calculus of gallbladder without cholecystitis without obstruction: Secondary | ICD-10-CM | POA: Diagnosis present

## 2018-04-25 DIAGNOSIS — R918 Other nonspecific abnormal finding of lung field: Secondary | ICD-10-CM | POA: Diagnosis present

## 2018-04-25 DIAGNOSIS — K219 Gastro-esophageal reflux disease without esophagitis: Secondary | ICD-10-CM | POA: Diagnosis present

## 2018-04-25 DIAGNOSIS — Z23 Encounter for immunization: Secondary | ICD-10-CM | POA: Diagnosis present

## 2018-04-25 DIAGNOSIS — G8929 Other chronic pain: Secondary | ICD-10-CM | POA: Diagnosis present

## 2018-04-25 DIAGNOSIS — E876 Hypokalemia: Secondary | ICD-10-CM | POA: Diagnosis present

## 2018-04-25 DIAGNOSIS — I252 Old myocardial infarction: Secondary | ICD-10-CM | POA: Diagnosis not present

## 2018-04-25 DIAGNOSIS — B182 Chronic viral hepatitis C: Secondary | ICD-10-CM | POA: Diagnosis present

## 2018-04-25 DIAGNOSIS — I161 Hypertensive emergency: Secondary | ICD-10-CM | POA: Diagnosis present

## 2018-04-25 DIAGNOSIS — F209 Schizophrenia, unspecified: Secondary | ICD-10-CM | POA: Diagnosis present

## 2018-04-25 DIAGNOSIS — J440 Chronic obstructive pulmonary disease with acute lower respiratory infection: Secondary | ICD-10-CM | POA: Diagnosis present

## 2018-04-25 DIAGNOSIS — F319 Bipolar disorder, unspecified: Secondary | ICD-10-CM | POA: Diagnosis present

## 2018-04-25 DIAGNOSIS — J441 Chronic obstructive pulmonary disease with (acute) exacerbation: Secondary | ICD-10-CM | POA: Diagnosis present

## 2018-04-25 DIAGNOSIS — R0789 Other chest pain: Secondary | ICD-10-CM | POA: Diagnosis present

## 2018-04-25 DIAGNOSIS — E860 Dehydration: Secondary | ICD-10-CM | POA: Diagnosis present

## 2018-04-25 DIAGNOSIS — K2970 Gastritis, unspecified, without bleeding: Secondary | ICD-10-CM | POA: Diagnosis present

## 2018-04-25 DIAGNOSIS — E782 Mixed hyperlipidemia: Secondary | ICD-10-CM | POA: Diagnosis present

## 2018-04-25 DIAGNOSIS — J189 Pneumonia, unspecified organism: Secondary | ICD-10-CM | POA: Diagnosis present

## 2018-04-25 DIAGNOSIS — M79603 Pain in arm, unspecified: Secondary | ICD-10-CM | POA: Diagnosis present

## 2018-04-25 DIAGNOSIS — I10 Essential (primary) hypertension: Secondary | ICD-10-CM | POA: Diagnosis present

## 2018-04-25 DIAGNOSIS — F419 Anxiety disorder, unspecified: Secondary | ICD-10-CM | POA: Diagnosis present

## 2018-04-25 DIAGNOSIS — I2510 Atherosclerotic heart disease of native coronary artery without angina pectoris: Secondary | ICD-10-CM | POA: Diagnosis present

## 2018-04-25 LAB — CBC
HCT: 42.7 % (ref 36.0–46.0)
Hemoglobin: 13.8 g/dL (ref 12.0–15.0)
MCH: 30.6 pg (ref 26.0–34.0)
MCHC: 32.3 g/dL (ref 30.0–36.0)
MCV: 94.7 fL (ref 80.0–100.0)
Platelets: 325 10*3/uL (ref 150–400)
RBC: 4.51 MIL/uL (ref 3.87–5.11)
RDW: 13.3 % (ref 11.5–15.5)
WBC: 8.5 10*3/uL (ref 4.0–10.5)
nRBC: 0 % (ref 0.0–0.2)

## 2018-04-25 LAB — NM MYOCAR MULTI W/SPECT W/WALL MOTION / EF
CHL CUP MPHR: 168 {beats}/min
CHL CUP NUCLEAR SRS: 9
CHL CUP NUCLEAR SSS: 1
CHL CUP RESTING HR STRESS: 63 {beats}/min
Estimated workload: 1 METS
Exercise duration (min): 1 min
Exercise duration (sec): 9 s
LV dias vol: 115 mL (ref 46–106)
LV sys vol: 66 mL
Peak HR: 93 {beats}/min
Percent HR: 55 %
SDS: 1
TID: 1.14

## 2018-04-25 LAB — TROPONIN I: Troponin I: <0.03 ng/mL (ref <0.03)

## 2018-04-25 MED ORDER — BUDESONIDE 0.5 MG/2ML IN SUSP
0.5000 mg | Freq: Two times a day (BID) | RESPIRATORY_TRACT | Status: DC
  Administered 2018-04-25 – 2018-04-26 (×3): 0.5 mg via RESPIRATORY_TRACT
  Filled 2018-04-25 (×4): qty 2

## 2018-04-25 MED ORDER — AZITHROMYCIN 250 MG PO TABS
500.0000 mg | ORAL_TABLET | Freq: Every day | ORAL | Status: AC
  Administered 2018-04-26 – 2018-04-27 (×2): 500 mg via ORAL
  Filled 2018-04-25 (×2): qty 2

## 2018-04-25 MED ORDER — METHYLPREDNISOLONE SODIUM SUCC 125 MG IJ SOLR
60.0000 mg | Freq: Four times a day (QID) | INTRAMUSCULAR | Status: DC
  Administered 2018-04-25 – 2018-04-26 (×4): 60 mg via INTRAVENOUS
  Filled 2018-04-25 (×4): qty 2

## 2018-04-25 MED ORDER — REGADENOSON 0.4 MG/5ML IV SOLN
0.4000 mg | Freq: Once | INTRAVENOUS | Status: AC
  Administered 2018-04-25: 0.4 mg via INTRAVENOUS

## 2018-04-25 MED ORDER — TECHNETIUM TC 99M TETROFOSMIN IV KIT
32.9300 | PACK | Freq: Once | INTRAVENOUS | Status: AC | PRN
  Administered 2018-04-25: 32.93 via INTRAVENOUS

## 2018-04-25 MED ORDER — MELOXICAM 7.5 MG PO TABS
15.0000 mg | ORAL_TABLET | Freq: Every day | ORAL | Status: DC
  Administered 2018-04-25 – 2018-04-27 (×3): 15 mg via ORAL
  Filled 2018-04-25 (×3): qty 2

## 2018-04-25 MED ORDER — TECHNETIUM TC 99M TETROFOSMIN IV KIT
10.9800 | PACK | Freq: Once | INTRAVENOUS | Status: AC | PRN
  Administered 2018-04-25: 10.98 via INTRAVENOUS
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
## 2018-04-25 NOTE — Plan of Care (Signed)
Pt for stress test today Problem: Coping: Goal: Level of anxiety will decrease Outcome: Progressing   Problem: Pain Managment: Goal: General experience of comfort will improve Outcome: Not Progressing Note:  Pt continues to have chest pain, treated twice with IV morphine which helps relief pain for a little while, pt put on 1L O2 for comfort, states she felt short of breath with the pain, sats were 100% though   Problem: Education: Goal: Knowledge of General Education information will improve Description Including pain rating scale, medication(s)/side effects and non-pharmacologic comfort measures Outcome: Completed/Met   

## 2018-04-25 NOTE — Progress Notes (Signed)
[facility] NAME: Tonantzin Mimnaugh    MR#:  [id number]  DATE OF BIRTH:  [birth year]  SUBJECTIVE:  C/o cp worse with breathing/coughing, tightness REVIEW OF SYSTEMS:  CONSTITUTIONAL: No fever, fatigue or weakness.  EYES: No blurred or double vision.  EARS, NOSE, AND THROAT: No tinnitus or ear pain.  RESPIRATORY: No cough, shortness of breath, wheezing or hemoptysis.  CARDIOVASCULAR: No chest pain, orthopnea, edema.  GASTROINTESTINAL: No nausea, vomiting, diarrhea or abdominal pain.  GENITOURINARY: No dysuria, hematuria.  ENDOCRINE: No polyuria, nocturia,  HEMATOLOGY: No anemia, easy bruising or bleeding SKIN: No rash or lesion. MUSCULOSKELETAL: No joint pain or arthritis.   NEUROLOGIC: No tingling, numbness, weakness.  PSYCHIATRY: No anxiety or depression.   ROS  DRUG ALLERGIES:   Allergies  Allergen Reactions  . Ativan [Lorazepam]     Anger    VITALS:  Blood pressure (!) 178/110, pulse 76, temperature (!) 97.3 F (36.3 C), temperature source Oral, resp. rate 18, height 5\' 2"  (1.575 m), weight 59.8 kg, SpO2 97 %.  PHYSICAL EXAMINATION:  GENERAL:  53 y.o.-year-old patient lying in the bed with no acute distress.  EYES: Pupils equal, round, reactive to light and accommodation. No scleral icterus. Extraocular muscles intact.  HEENT: Head atraumatic, normocephalic. Oropharynx and nasopharynx clear.  NECK:  Supple, no jugular venous distention. No thyroid enlargement, no tenderness.  LUNGS: Normal breath sounds bilaterally, no wheezing, rales,rhonchi or crepitation. No use of accessory muscles of respiration.  CARDIOVASCULAR: S1, S2 normal. No murmurs, rubs, or gallops.  ABDOMEN: Soft, nontender, nondistended. Bowel sounds present. No organomegaly or mass.  EXTREMITIES: No pedal edema, cyanosis, or clubbing.  NEUROLOGIC: Cranial nerves II through XII are intact. Muscle strength 5/5 in all extremities. Sensation intact. Gait not  checked.  PSYCHIATRIC: The patient is alert and oriented x 3.  SKIN: No obvious rash, lesion, or ulcer.   Physical Exam LABORATORY PANEL:   CBC Recent Labs  Lab 04/24/18 1635  WBC 17.2*  HGB 14.7  HCT 43.8  PLT 365   ------------------------------------------------------------------------------------------------------------------  Chemistries  Recent Labs  Lab 04/24/18 1635  NA 139  K 3.4*  CL 108  CO2 25  GLUCOSE 119*  BUN 25*  CREATININE 1.16*  CALCIUM 8.7*  AST 19  ALT 17  ALKPHOS 98  BILITOT 0.4   ------------------------------------------------------------------------------------------------------------------  Cardiac Enzymes Recent Labs  Lab 04/24/18 2205 04/25/18 0254  TROPONINI <0.03 <0.03   ------------------------------------------------------------------------------------------------------------------  RADIOLOGY:  Dg Chest Portable 1 View  Result Date: 04/24/2018 CLINICAL DATA:  Chest pain and shortness of breath for 3 days, hypertension, asthma, MI, COPD, smoker EXAM: PORTABLE CHEST 1 VIEW COMPARISON:  Portable exam 1657 hours compared to 01/21/2017 FINDINGS: Normal heart size, mediastinal contours, and pulmonary vascularity. Lungs slightly hyperinflated but clear. No pulmonary infiltrate, pleural effusion or pneumothorax. Bones demineralized. IMPRESSION: No acute abnormalities. Resolution of infiltrates seen on previous exam. Electronically Signed   By: Lavonia Dana M.D.   On: 04/24/2018 17:08   Ct Angio Chest/abd/pel For Dissection W And/or Wo Contrast  Result Date: 04/24/2018 CLINICAL DATA:  53 year old female with intermittent chest pain for the past 3 days with radiation down the right arm. EXAM: CT ANGIOGRAPHY CHEST, ABDOMEN AND PELVIS TECHNIQUE: Multidetector CT imaging through the chest, abdomen and pelvis was performed using the standard protocol during bolus administration of intravenous contrast. Multiplanar reconstructed images and MIPs were  obtained and reviewed to evaluate the vascular anatomy. CONTRAST:  45mL OMNIPAQUE IOHEXOL 350 MG/ML  SOLN COMPARISON:  Prior CT scan of the abdomen and pelvis 06/23/2015 FINDINGS: CTA CHEST FINDINGS Cardiovascular: Initial unenhanced CT imaging of the chest demonstrates no evidence of acute intramural hematoma. Following the administration of intravenous contrast, there is excellent opacification of the thoracic aorta. Conventional 3 vessel arch anatomy. No evidence of aortic dissection, aneurysm or penetrating ulcer. No significant atherosclerotic plaque. Calcifications present along the coronary arteries. A metallic stent is present in the proximal circumflex coronary artery. The heart is normal in size. No pericardial effusion. Mediastinum/Nodes: Unremarkable CT appearance of the thyroid gland. No suspicious mediastinal or hilar adenopathy. No soft tissue mediastinal mass. The thoracic esophagus is unremarkable. Lungs/Pleura: Multifocal geographic areas of bilateral upper lung predominant ground-glass attenuation airspace opacities with multiple small irregular associated cystic spaces. Findings predominantly affect both upper lobes as well as the superior segment of the left lower lobe. The remaining lungs are relatively spared. Diffuse mild bronchial wall thickening. Musculoskeletal: No acute fracture or aggressive appearing lytic or blastic osseous lesion. Review of the MIP images confirms the above findings. CTA ABDOMEN AND PELVIS FINDINGS VASCULAR Aorta: Normal caliber aorta without aneurysm, dissection, vasculitis or significant stenosis. Celiac: Diffusely irregular proximal celiac artery with areas of beading/webbing visualized on the sagittal reformatted images. Suspect underlying fibromuscular dysplasia. SMA: Patent without evidence of aneurysm, dissection, vasculitis or significant stenosis. Renals: Both renal arteries are patent without evidence of aneurysm, dissection, vasculitis, fibromuscular  dysplasia or significant stenosis. Fibrofatty atherosclerotic plaque results in mild focal stenosis of the proximal left renal artery. IMA: Patent without evidence of aneurysm, dissection, vasculitis or significant stenosis. Inflow: Patent without evidence of aneurysm, dissection, vasculitis or significant stenosis. Veins: No obvious venous abnormality within the limitations of this arterial phase study. Review of the MIP images confirms the above findings. NON-VASCULAR Hepatobiliary: No focal liver abnormality is seen. No gallstones, gallbladder wall thickening, or biliary dilatation. Pancreas: Unremarkable. No pancreatic ductal dilatation or surrounding inflammatory changes. Spleen: Normal in size without focal abnormality. Adrenals/Urinary Tract: Normal adrenal glands. Mild multifocal renal cortical thinning consistent with areas of renal cortical scarring. No evidence of hydronephrosis, nephrolithiasis or enhancing renal mass. The ureters and bladder are unremarkable. Stomach/Bowel: No evidence of obstruction or focal bowel wall thickening. Normal appendix in the right lower quadrant. The terminal ileum is unremarkable. Lymphatic: No suspicious lymphadenopathy. Reproductive: Uterus and bilateral adnexa are unremarkable. Other: No abdominal wall hernia or abnormality. No abdominopelvic ascites. Musculoskeletal: No acute or significant osseous findings. 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 Review of the MIP images confirms the above findings. IMPRESSION: 1. No evidence of aortic dissection, aneurysm or acute vascular abnormality. 2. Abnormal findings in the upper lungs bilaterally. There is a combination of multifocal ground-glass attenuation airspace opacities, ground-glass nodules, and small irregular cystic changes. Given the distribution, an inhalational source is favored. Findings may represent smoking related Langerhans cell histiocytosis, respiratory bronchiolitis interstitial lung disease (RB-ILD),  hypersensitivity pneumonitis, or a multifocal infectious process including atypical pneumonia. If the patient is a smoker, recommend smoking cessation and then repeat chest CT in 3 months. 3. 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 Irregular fibrofatty atherosclerotic plaque or sequelae from prior chronic dissection are less likely considerations. 4. Mild stenosis of the proximal left renal artery. 5. Coronary artery disease with coronary stent in place. 6.  Aortic Atherosclerosis (ICD10-170.0). 7. Mild multifocal renal cortical scarring bilaterally. 8. 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 Electronically Signed   By: Jacqulynn Cadet M.D.   On: 04/24/2018 18:14    ASSESSMENT AND PLAN:  *acute chest pain. Suppect due to [facility] to see, continue aspirin, Brilinta, and Lipitor. Follow-up on stress tesing  *acute on copd ex Solumedrol, BTs prn, mucolytic agents  *acute possible pneumonitis or pneumonia. Continue Zithromax and Rocephin.  Robitussin as needed.  *Hypokalemia Respleted  *chronic tobacco smoking Nicotine patch and cessation counseling  *Mild dehydration Encourage oral intake.  All the records are reviewed and case discussed with Care Management/Social Workerr. Management plans discussed with the patient, family and they are in agreement.  CODE STATUS: full  TOTAL TIME TAKING CARE OF THIS PATIENT: 35 minutes.     POSSIBLE D/C IN 1-2 DAYS, DEPENDING ON CLINICAL CONDITION.   Avel Peace Samanyu Tinnell M.D on 04/25/2018   Between 7am to 6pm - Pager - [phone]  After 6pm go to [email or web address] - password [email or web address] [facility]  Office  [phone]  CC: Primary care physician; Jodi Marble, MD  Note: This dictation was prepared with Dragon dictation along with smaller phrase technology. Any transcriptional  errors that result from this process are unintentional.

## 2018-04-26 LAB — HIV ANTIBODY (ROUTINE TESTING W REFLEX): HIV Screen 4th Generation wRfx: NONREACTIVE

## 2018-04-26 MED ORDER — NICOTINE 14 MG/24HR TD PT24
14.0000 mg | MEDICATED_PATCH | Freq: Every day | TRANSDERMAL | Status: DC
  Administered 2018-04-26 – 2018-04-27 (×2): 14 mg via TRANSDERMAL
  Filled 2018-04-26 (×2): qty 1

## 2018-04-26 MED ORDER — CLONAZEPAM 1 MG PO TABS
1.0000 mg | ORAL_TABLET | Freq: Three times a day (TID) | ORAL | Status: DC | PRN
  Administered 2018-04-26: 1 mg via ORAL
  Filled 2018-04-26: qty 1

## 2018-04-26 MED ORDER — ALUM & MAG HYDROXIDE-SIMETH 200-200-20 MG/5ML PO SUSP
30.0000 mL | Freq: Once | ORAL | Status: AC
  Administered 2018-04-26: 30 mL via ORAL
  Filled 2018-04-26: qty 30

## 2018-04-26 MED ORDER — LIDOCAINE VISCOUS HCL 2 % MT SOLN
15.0000 mL | Freq: Once | OROMUCOSAL | Status: AC
  Administered 2018-04-26: 15 mL via ORAL
  Filled 2018-04-26: qty 15

## 2018-04-26 MED ORDER — CEFDINIR 300 MG PO CAPS
300.0000 mg | ORAL_CAPSULE | Freq: Two times a day (BID) | ORAL | Status: DC
  Administered 2018-04-26 – 2018-04-27 (×3): 300 mg via ORAL
  Filled 2018-04-26 (×4): qty 1

## 2018-04-26 MED ORDER — HYDRALAZINE HCL 20 MG/ML IJ SOLN
10.0000 mg | INTRAMUSCULAR | Status: DC | PRN
  Administered 2018-04-26: 10 mg via INTRAVENOUS
  Filled 2018-04-26: qty 1

## 2018-04-26 MED ORDER — CLONIDINE HCL 0.1 MG PO TABS
0.2000 mg | ORAL_TABLET | Freq: Two times a day (BID) | ORAL | Status: DC
  Administered 2018-04-26 – 2018-04-27 (×3): 0.2 mg via ORAL
  Filled 2018-04-26 (×3): qty 2

## 2018-04-26 MED ORDER — METHYLPREDNISOLONE SODIUM SUCC 40 MG IJ SOLR
40.0000 mg | Freq: Two times a day (BID) | INTRAMUSCULAR | Status: DC
  Administered 2018-04-26 – 2018-04-27 (×2): 40 mg via INTRAVENOUS
  Filled 2018-04-26 (×2): qty 1

## 2018-04-26 MED ORDER — AMLODIPINE BESYLATE 10 MG PO TABS
10.0000 mg | ORAL_TABLET | Freq: Every day | ORAL | Status: DC
  Administered 2018-04-26 – 2018-04-27 (×2): 10 mg via ORAL
  Filled 2018-04-26 (×3): qty 1

## 2018-04-26 MED ORDER — LOSARTAN POTASSIUM 50 MG PO TABS
50.0000 mg | ORAL_TABLET | Freq: Every day | ORAL | Status: DC
  Administered 2018-04-26 – 2018-04-27 (×2): 50 mg via ORAL
  Filled 2018-04-26 (×2): qty 1

## 2018-04-26 MED ORDER — HYOSCYAMINE SULFATE 0.125 MG SL SUBL
0.2500 mg | SUBLINGUAL_TABLET | Freq: Once | SUBLINGUAL | Status: AC
  Administered 2018-04-26: 0.25 mg via SUBLINGUAL
  Filled 2018-04-26: qty 2

## 2018-04-26 MED ORDER — DICYCLOMINE HCL 10 MG/5ML PO SOLN
10.0000 mg | Freq: Once | ORAL | Status: AC
  Administered 2018-04-26: 10 mg via ORAL
  Filled 2018-04-26: qty 5

## 2018-04-26 MED ORDER — MORPHINE SULFATE (PF) 2 MG/ML IV SOLN
1.0000 mg | INTRAVENOUS | Status: DC | PRN
  Administered 2018-04-26 – 2018-04-27 (×5): 1 mg via INTRAVENOUS
  Filled 2018-04-26 (×5): qty 1

## 2018-04-26 MED ORDER — HYDROCODONE-ACETAMINOPHEN 5-325 MG PO TABS
1.0000 | ORAL_TABLET | Freq: Four times a day (QID) | ORAL | Status: DC | PRN
  Administered 2018-04-26: 1 via ORAL
  Filled 2018-04-26: qty 1

## 2018-04-26 NOTE — Progress Notes (Signed)
[facility] Cardiology [facility] Encounter Note  Patient: Carol Henderson / Admit Date: 04/24/2018 / Date of Encounter: 04/26/2018, 8:06 AM   Subjective: Patient still with shortness of breath cough and congestion from likely bronchitis pneumonia and other changes on chest x-ray without evidence of heart failure or myocardial infarction.  Patient did have chest pain diffuse in nature but also had a stress test showing normal myocardial perfusion without evidence of myocardial ischemia.  Currently patient has had some improvement since hospitalization  Review of Systems: Positive for: This of breath cough congestion Negative for: Vision change, hearing change, syncope, dizziness, nausea, vomiting,diarrhea, bloody stool, stomach pain, positive for cough, congestion negative for, diaphoresis, urinary frequency, urinary pain,skin lesions, skin rashes Others previously listed  Objective: Telemetry: Normal sinus rhythm Physical Exam: Blood pressure (!) 208/95, pulse 65, temperature 97.8 F (36.6 C), temperature source Oral, resp. rate 19, height 5\' 2"  (1.575 m), weight 61 kg, SpO2 98 %. Body mass index is 24.6 kg/m. General: Well developed, well nourished, in no acute distress. Head: Normocephalic, atraumatic, sclera non-icteric, no xanthomas, nares are without discharge. Neck: No apparent masses Lungs: Normal respirations with diffuse wheezes, some rhonchi, no rales , few crackles   Heart: Regular rate and rhythm, normal S1 S2, no murmur, no rub, no gallop, PMI is normal size and placement, carotid upstroke normal without bruit, jugular venous pressure normal Abdomen: Soft, non-tender, non-distended with normoactive bowel sounds. No hepatosplenomegaly. Abdominal aorta is normal size without bruit Extremities: No edema, no clubbing, no cyanosis, no ulcers,  Peripheral: 2+ radial, 2+ femoral, 2+ dorsal pedal pulses Neuro: Alert and oriented. Moves all extremities spontaneously. Psych:  Responds  to questions appropriately with a normal affect.   Intake/Output Summary (Last 24 hours) at 04/26/2018 0806 Last data filed at 04/26/2018 0448 Gross per 24 hour  Intake 824.52 ml  Output 1500 ml  Net -675.48 ml    Inpatient Medications:  . amLODipine  10 mg Oral Daily  . atorvastatin  40 mg Oral q1800  . azithromycin  500 mg Oral Daily  . budesonide (PULMICORT) nebulizer solution  0.5 mg Nebulization BID  . cloNIDine  0.1 mg Oral BID  . enoxaparin (LOVENOX) injection  40 mg Subcutaneous Q24H  . gabapentin  400 mg Oral TID  . Influenza vac split quadrivalent PF  0.5 mL Intramuscular Tomorrow-1000  . meloxicam  15 mg Oral Daily  . methylPREDNISolone (SOLU-MEDROL) injection  60 mg Intravenous Q6H  . pantoprazole  40 mg Oral Daily  . ticagrelor  90 mg Oral BID  . tiotropium  18 mcg Inhalation Daily   Infusions:  . cefTRIAXone (ROCEPHIN)  IV Stopped (04/25/18 1837)    Labs: Recent Labs    04/24/18 1635  NA 139  K 3.4*  CL 108  CO2 25  GLUCOSE 119*  BUN 25*  CREATININE 1.16*  CALCIUM 8.7*   Recent Labs    04/24/18 1635  AST 19  ALT 17  ALKPHOS 98  BILITOT 0.4  PROT 7.7  ALBUMIN 3.9   Recent Labs    04/24/18 1635 04/25/18 0254  WBC 17.2* 8.5  HGB 14.7 13.8  HCT 43.8 42.7  MCV 92.0 94.7  PLT 365 325   Recent Labs    04/24/18 1635 04/24/18 2205 04/25/18 0254  TROPONINI <0.03 <0.03 <0.03   Invalid input(s): POCBNP No results for input(s): HGBA1C in the last 72 hours.   Weights: Filed Weights   04/24/18 2138 04/25/18 0426 04/26/18 0448  Weight: 59.6 kg 59.8  kg 61 kg     Radiology/Studies:  Nm Myocar Multi W/spect W/wall Motion / Ef  Result Date: 04/25/2018  Blood pressure demonstrated a normal response to exercise.  There was no ST segment deviation noted during stress.  The study is normal.  This is a low risk study.  The left ventricular ejection fraction is normal (55-65%).    Dg Chest Portable 1 View  Result Date: 04/24/2018 CLINICAL  DATA:  Chest pain and shortness of breath for 3 days, hypertension, asthma, MI, COPD, smoker EXAM: PORTABLE CHEST 1 VIEW COMPARISON:  Portable exam 1657 hours compared to 01/21/2017 FINDINGS: Normal heart size, mediastinal contours, and pulmonary vascularity. Lungs slightly hyperinflated but clear. No pulmonary infiltrate, pleural effusion or pneumothorax. Bones demineralized. IMPRESSION: No acute abnormalities. Resolution of infiltrates seen on previous exam. Electronically Signed   By: Lavonia Dana M.D.   On: 04/24/2018 17:08   Ct Angio Chest/abd/pel For Dissection W And/or Wo Contrast  Result Date: 04/24/2018 CLINICAL DATA:  53 year old female with intermittent chest pain for the past 3 days with radiation down the right arm. EXAM: CT ANGIOGRAPHY CHEST, ABDOMEN AND PELVIS TECHNIQUE: Multidetector CT imaging through the chest, abdomen and pelvis was performed using the standard protocol during bolus administration of intravenous contrast. Multiplanar reconstructed images and MIPs were obtained and reviewed to evaluate the vascular anatomy. CONTRAST:  44mL OMNIPAQUE IOHEXOL 350 MG/ML SOLN COMPARISON:  Prior CT scan of the abdomen and pelvis 06/23/2015 FINDINGS: CTA CHEST FINDINGS Cardiovascular: Initial unenhanced CT imaging of the chest demonstrates no evidence of acute intramural hematoma. Following the administration of intravenous contrast, there is excellent opacification of the thoracic aorta. Conventional 3 vessel arch anatomy. No evidence of aortic dissection, aneurysm or penetrating ulcer. No significant atherosclerotic plaque. Calcifications present along the coronary arteries. A metallic stent is present in the proximal circumflex coronary artery. The heart is normal in size. No pericardial effusion. Mediastinum/Nodes: Unremarkable CT appearance of the thyroid gland. No suspicious mediastinal or hilar adenopathy. No soft tissue mediastinal mass. The thoracic esophagus is unremarkable. Lungs/Pleura:  Multifocal geographic areas of bilateral upper lung predominant ground-glass attenuation airspace opacities with multiple small irregular associated cystic spaces. Findings predominantly affect both upper lobes as well as the superior segment of the left lower lobe. The remaining lungs are relatively spared. Diffuse mild bronchial wall thickening. Musculoskeletal: No acute fracture or aggressive appearing lytic or blastic osseous lesion. Review of the MIP images confirms the above findings. CTA ABDOMEN AND PELVIS FINDINGS VASCULAR Aorta: Normal caliber aorta without aneurysm, dissection, vasculitis or significant stenosis. Celiac: Diffusely irregular proximal celiac artery with areas of beading/webbing visualized on the sagittal reformatted images. Suspect underlying fibromuscular dysplasia. SMA: Patent without evidence of aneurysm, dissection, vasculitis or significant stenosis. Renals: Both renal arteries are patent without evidence of aneurysm, dissection, vasculitis, fibromuscular dysplasia or significant stenosis. Fibrofatty atherosclerotic plaque results in mild focal stenosis of the proximal left renal artery. IMA: Patent without evidence of aneurysm, dissection, vasculitis or significant stenosis. Inflow: Patent without evidence of aneurysm, dissection, vasculitis or significant stenosis. Veins: No obvious venous abnormality within the limitations of this arterial phase study. Review of the MIP images confirms the above findings. NON-VASCULAR Hepatobiliary: No focal liver abnormality is seen. No gallstones, gallbladder wall thickening, or biliary dilatation. Pancreas: Unremarkable. No pancreatic ductal dilatation or surrounding inflammatory changes. Spleen: Normal in size without focal abnormality. Adrenals/Urinary Tract: Normal adrenal glands. Mild multifocal renal cortical thinning consistent with areas of renal cortical scarring. No evidence of hydronephrosis, nephrolithiasis or  enhancing renal mass. The  ureters and bladder are unremarkable. Stomach/Bowel: No evidence of obstruction or focal bowel wall thickening. Normal appendix in the right lower quadrant. The terminal ileum is unremarkable. Lymphatic: No suspicious lymphadenopathy. Reproductive: Uterus and bilateral adnexa are unremarkable. Other: No abdominal wall hernia or abnormality. No abdominopelvic ascites. Musculoskeletal: No acute or significant osseous findings. 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 Review of the MIP images confirms the above findings. IMPRESSION: 1. No evidence of aortic dissection, aneurysm or acute vascular abnormality. 2. Abnormal findings in the upper lungs bilaterally. There is a combination of multifocal ground-glass attenuation airspace opacities, ground-glass nodules, and small irregular cystic changes. Given the distribution, an inhalational source is favored. Findings may represent smoking related Langerhans cell histiocytosis, respiratory bronchiolitis interstitial lung disease (RB-ILD), hypersensitivity pneumonitis, or a multifocal infectious process including atypical pneumonia. If the patient is a smoker, recommend smoking cessation and then repeat chest CT in 3 months. 3. 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 Irregular fibrofatty atherosclerotic plaque or sequelae from prior chronic dissection are less likely considerations. 4. Mild stenosis of the proximal left renal artery. 5. Coronary artery disease with coronary stent in place. 6.  Aortic Atherosclerosis (ICD10-170.0). 7. Mild multifocal renal cortical scarring bilaterally. 8. 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 Electronically Signed   By: Jacqulynn Cadet M.D.   On: 04/24/2018 18:14     Assessment and Recommendation  53 y.o. female with known cardiovascular disease essential hypertension mixed hyperlipidemia tobacco abuse  with acute respiratory infection and chest discomfort without evidence of myocardial infarction or congestive heart failure with a normal stress test without evidence of myocardial ischemia 1.  Continue medication management for further risk reduction of cardiovascular event 2.  Continue hypertension control with amlodipine clonidine with a goal systolic blood pressure below 130 mm 3.  Continue atorvastatin for further risk reduction in cardiovascular event 4.  Dual antiplatelet therapy for previous stent 5.  No further cardiac diagnostics necessary at this time 5.  Okay for discharge home from cardiac failure standpoint  Signed, Serafina Royals M.D. FACC

## 2018-04-26 NOTE — Progress Notes (Signed)
[facility] [place] NAME: Nylia Gavina    MR#:  [id number]  DATE OF BIRTH:  02/14/1966  SUBJECTIVE:  Patient is now complaining of upper abdominal pain, patient is concern for gallstone, cardiology input appreciated, noted uncontrolled blood pressure-systolic blood pressure greater than 200, stress test was a normal study, discontinue telemetry, increase clonidine, start losartan, check abdominal ultrasound for further evaluation REVIEW OF SYSTEMS:  CONSTITUTIONAL: No fever, fatigue or weakness.  EYES: No blurred or double vision.  EARS, NOSE, AND THROAT: No tinnitus or ear pain.  RESPIRATORY: No cough, shortness of breath, wheezing or hemoptysis.  CARDIOVASCULAR: No chest pain, orthopnea, edema.  GASTROINTESTINAL: No nausea, vomiting, diarrhea or abdominal pain.  GENITOURINARY: No dysuria, hematuria.  ENDOCRINE: No polyuria, nocturia,  HEMATOLOGY: No anemia, easy bruising or bleeding SKIN: No rash or lesion. MUSCULOSKELETAL: No joint pain or arthritis.   NEUROLOGIC: No tingling, numbness, weakness.  PSYCHIATRY: No anxiety or depression.   ROS  DRUG ALLERGIES:   Allergies  Allergen Reactions  . Ativan [Lorazepam]     Anger    VITALS:  Blood pressure 129/87, pulse 76, temperature 97.8 F (36.6 C), temperature source Oral, resp. rate 19, height 5\' 2"  (1.575 m), weight 61 kg, SpO2 99 %.  PHYSICAL EXAMINATION:  GENERAL:  53 y.o.-year-old patient lying in the bed with no acute distress.  EYES: Pupils equal, round, reactive to light and accommodation. No scleral icterus. Extraocular muscles intact.  HEENT: Head atraumatic, normocephalic. Oropharynx and nasopharynx clear.  NECK:  Supple, no jugular venous distention. No thyroid enlargement, no tenderness.  LUNGS: Normal breath sounds bilaterally, no wheezing, rales,rhonchi or crepitation. No use of accessory muscles of respiration.  CARDIOVASCULAR: S1, S2 normal. No murmurs, rubs, or  gallops.  ABDOMEN: Soft, nontender, nondistended. Bowel sounds present. No organomegaly or mass.  EXTREMITIES: No pedal edema, cyanosis, or clubbing.  NEUROLOGIC: Cranial nerves II through XII are intact. Muscle strength 5/5 in all extremities. Sensation intact. Gait not checked.  PSYCHIATRIC: The patient is alert and oriented x 3.  SKIN: No obvious rash, lesion, or ulcer.   Physical Exam LABORATORY PANEL:   CBC Recent Labs  Lab 04/25/18 0254  WBC 8.5  HGB 13.8  HCT 42.7  PLT 325   ------------------------------------------------------------------------------------------------------------------  Chemistries  Recent Labs  Lab 04/24/18 1635  NA 139  K 3.4*  CL 108  CO2 25  GLUCOSE 119*  BUN 25*  CREATININE 1.16*  CALCIUM 8.7*  AST 19  ALT 17  ALKPHOS 98  BILITOT 0.4   ------------------------------------------------------------------------------------------------------------------  Cardiac Enzymes Recent Labs  Lab 04/24/18 2205 04/25/18 0254  TROPONINI <0.03 <0.03   ------------------------------------------------------------------------------------------------------------------  RADIOLOGY:  Nm Myocar Multi W/spect W/wall Motion / Ef  Result Date: 04/25/2018  Blood pressure demonstrated a normal response to exercise.  There was no ST segment deviation noted during stress.  The study is normal.  This is a low risk study.  The left ventricular ejection fraction is normal (55-65%).    Dg Chest Portable 1 View  Result Date: 04/24/2018 CLINICAL DATA:  Chest pain and shortness of breath for 3 days, hypertension, asthma, MI, COPD, smoker EXAM: PORTABLE CHEST 1 VIEW COMPARISON:  Portable exam 1657 hours compared to 01/21/2017 FINDINGS: Normal heart size, mediastinal contours, and pulmonary vascularity. Lungs slightly hyperinflated but clear. No pulmonary infiltrate, pleural effusion or pneumothorax. Bones demineralized. IMPRESSION: No acute abnormalities. Resolution  of infiltrates seen on previous exam. Electronically Signed   By: Crist Infante.D.  On: 04/24/2018 17:08   Ct Angio Chest/abd/pel For Dissection W And/or Wo Contrast  Result Date: 04/24/2018 CLINICAL DATA:  53 year old female with intermittent chest pain for the past 3 days with radiation down the right arm. EXAM: CT ANGIOGRAPHY CHEST, ABDOMEN AND PELVIS TECHNIQUE: Multidetector CT imaging through the chest, abdomen and pelvis was performed using the standard protocol during bolus administration of intravenous contrast. Multiplanar reconstructed images and MIPs were obtained and reviewed to evaluate the vascular anatomy. CONTRAST:  105mL OMNIPAQUE IOHEXOL 350 MG/ML SOLN COMPARISON:  Prior CT scan of the abdomen and pelvis 06/23/2015 FINDINGS: CTA CHEST FINDINGS Cardiovascular: Initial unenhanced CT imaging of the chest demonstrates no evidence of acute intramural hematoma. Following the administration of intravenous contrast, there is excellent opacification of the thoracic aorta. Conventional 3 vessel arch anatomy. No evidence of aortic dissection, aneurysm or penetrating ulcer. No significant atherosclerotic plaque. Calcifications present along the coronary arteries. A metallic stent is present in the proximal circumflex coronary artery. The heart is normal in size. No pericardial effusion. Mediastinum/Nodes: Unremarkable CT appearance of the thyroid gland. No suspicious mediastinal or hilar adenopathy. No soft tissue mediastinal mass. The thoracic esophagus is unremarkable. Lungs/Pleura: Multifocal geographic areas of bilateral upper lung predominant ground-glass attenuation airspace opacities with multiple small irregular associated cystic spaces. Findings predominantly affect both upper lobes as well as the superior segment of the left lower lobe. The remaining lungs are relatively spared. Diffuse mild bronchial wall thickening. Musculoskeletal: No acute fracture or aggressive appearing lytic or blastic  osseous lesion. Review of the MIP images confirms the above findings. CTA ABDOMEN AND PELVIS FINDINGS VASCULAR Aorta: Normal caliber aorta without aneurysm, dissection, vasculitis or significant stenosis. Celiac: Diffusely irregular proximal celiac artery with areas of beading/webbing visualized on the sagittal reformatted images. Suspect underlying fibromuscular dysplasia. SMA: Patent without evidence of aneurysm, dissection, vasculitis or significant stenosis. Renals: Both renal arteries are patent without evidence of aneurysm, dissection, vasculitis, fibromuscular dysplasia or significant stenosis. Fibrofatty atherosclerotic plaque results in mild focal stenosis of the proximal left renal artery. IMA: Patent without evidence of aneurysm, dissection, vasculitis or significant stenosis. Inflow: Patent without evidence of aneurysm, dissection, vasculitis or significant stenosis. Veins: No obvious venous abnormality within the limitations of this arterial phase study. Review of the MIP images confirms the above findings. NON-VASCULAR Hepatobiliary: No focal liver abnormality is seen. No gallstones, gallbladder wall thickening, or biliary dilatation. Pancreas: Unremarkable. No pancreatic ductal dilatation or surrounding inflammatory changes. Spleen: Normal in size without focal abnormality. Adrenals/Urinary Tract: Normal adrenal glands. Mild multifocal renal cortical thinning consistent with areas of renal cortical scarring. No evidence of hydronephrosis, nephrolithiasis or enhancing renal mass. The ureters and bladder are unremarkable. Stomach/Bowel: No evidence of obstruction or focal bowel wall thickening. Normal appendix in the right lower quadrant. The terminal ileum is unremarkable. Lymphatic: No suspicious lymphadenopathy. Reproductive: Uterus and bilateral adnexa are unremarkable. Other: No abdominal wall hernia or abnormality. No abdominopelvic ascites. Musculoskeletal: No acute or significant osseous  findings. 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 Review of the MIP images confirms the above findings. IMPRESSION: 1. No evidence of aortic dissection, aneurysm or acute vascular abnormality. 2. Abnormal findings in the upper lungs bilaterally. There is a combination of multifocal ground-glass attenuation airspace opacities, ground-glass nodules, and small irregular cystic changes. Given the distribution, an inhalational source is favored. Findings may represent smoking related Langerhans cell histiocytosis, respiratory bronchiolitis interstitial lung disease (RB-ILD), hypersensitivity pneumonitis, or a multifocal infectious process including atypical pneumonia. If the patient is a smoker,  recommend smoking cessation and then repeat chest CT in 3 months. 3. 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 Irregular fibrofatty atherosclerotic plaque or sequelae from prior chronic dissection are less likely considerations. 4. Mild stenosis of the proximal left renal artery. 5. Coronary artery disease with coronary stent in place. 6.  Aortic Atherosclerosis (ICD10-170.0). 7. Mild multifocal renal cortical scarring bilaterally. 8. 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 Electronically Signed   By: Jacqulynn Cadet M.D.   On: 04/24/2018 18:14    ASSESSMENT AND PLAN:  *acute chest pain Resolved Suppect due to [place] Cardiology input appreciated, ACS ruled out, continue aspirin, Brilinta, and Lipitor. Nuclear medicine stress testing was -April 25, 2018 Recommended outpatient follow-up with Dr. Nehemiah Massed in 1 to 2 weeks for reevaluation  *acute on copd ex Resolving Continue Solu-Medrol with tapering as tolerated, breathing treatments PRN, inhaled corticosteroids, mucolytic agents  *acute possible pneumonitis or pneumonia Resolving Continue empiric Zithromax and Rocephin, Robitussin as  needed  *Acute upper abdominal/epigastric abdominal pain Noted history of gallstones, LFTs normal GI cocktail x1, PPI daily, will check right upper quadrant ultrasound for further evaluation  *Acute accelerated hypertension Increase clonidine to point 2 mg twice daily, losartan, PRN IV hydralazine, vitals per routine, make changes as per necessary  *Hypokalemia Respleted  *chronic tobacco smoking Nicotine patch and cessation counseling  *Mild dehydration Encourage oral intake.  *Chronic GAD Klonopin as needed   Disposition Home in 1 to 2 days barring any complications  All the records are reviewed and case discussed with Care Management/Social Workerr. Management plans discussed with the patient, family and they are in agreement.  CODE STATUS: full  TOTAL TIME TAKING CARE OF THIS PATIENT: 35 minutes.     POSSIBLE D/C IN 1-2 DAYS, DEPENDING ON CLINICAL CONDITION.   Avel Peace Salary M.D on 04/26/2018   Between 7am to 6pm - Pager - [phone]  After 6pm go to [email or web address] - password [email or web address] [facility]  Office  [phone]  CC: Primary care physician; Jodi Marble, MD  Note: This dictation was prepared with Dragon dictation along with smaller phrase technology. Any transcriptional errors that result from this process are unintentional.

## 2018-04-26 NOTE — Progress Notes (Signed)
Pt's BP high this morning- Blood pressure (!) 211/128, pulse (!) 55   Dr. Darvin Neighbours paged, he put in orders for PO amlodipine & PRN IV hydralazine to give for BP >180, said to give both now. Will give and recheck.  Conley Simmonds, RN, BSN

## 2018-04-27 ENCOUNTER — Inpatient Hospital Stay: Payer: Medicare Other

## 2018-04-27 MED ORDER — LOSARTAN POTASSIUM 50 MG PO TABS: 100.0000 mg | ORAL_TABLET | Freq: Every day | ORAL | Status: DC

## 2018-04-27 MED ORDER — HYDROCODONE-ACETAMINOPHEN 5-325 MG PO TABS: 1.0000 | ORAL_TABLET | Freq: Four times a day (QID) | ORAL | 0 refills | Status: DC | PRN

## 2018-04-27 MED ORDER — NICOTINE 14 MG/24HR TD PT24: 14.0000 mg | MEDICATED_PATCH | Freq: Every day | TRANSDERMAL | 0 refills | Status: DC

## 2018-04-27 MED ORDER — LOSARTAN POTASSIUM 50 MG PO TABS
50.0000 mg | ORAL_TABLET | Freq: Once | ORAL | Status: AC
  Administered 2018-04-27: 50 mg via ORAL
  Filled 2018-04-27: qty 1

## 2018-04-27 MED ORDER — PANTOPRAZOLE SODIUM 40 MG PO TBEC: 40.0000 mg | DELAYED_RELEASE_TABLET | Freq: Two times a day (BID) | ORAL | 0 refills | Status: DC

## 2018-04-27 MED ORDER — SUCRALFATE 1 G PO TABS: 1.0000 g | ORAL_TABLET | Freq: Four times a day (QID) | ORAL | 1 refills | Status: DC

## 2018-04-27 MED ORDER — LOSARTAN POTASSIUM 100 MG PO TABS: 100.0000 mg | ORAL_TABLET | Freq: Every day | ORAL | 0 refills | Status: DC

## 2018-04-27 MED ORDER — AMLODIPINE BESYLATE 10 MG PO TABS: 10.0000 mg | ORAL_TABLET | Freq: Every day | ORAL | 0 refills | Status: DC

## 2018-04-27 MED ORDER — CLONIDINE HCL 0.2 MG PO TABS: 0.2000 mg | ORAL_TABLET | Freq: Two times a day (BID) | ORAL | 11 refills | Status: DC

## 2018-04-27 MED ORDER — CLONAZEPAM 1 MG PO TABS: 1.0000 mg | ORAL_TABLET | Freq: Three times a day (TID) | ORAL | 0 refills | Status: DC | PRN

## 2018-04-27 MED ORDER — PANTOPRAZOLE SODIUM 40 MG PO TBEC: 40.0000 mg | DELAYED_RELEASE_TABLET | Freq: Two times a day (BID) | ORAL | Status: DC

## 2018-04-27 NOTE — Progress Notes (Signed)
Pt discharged to home via wc.  Instructions and rx given to pt.  Questions answered.  No distress.  

## 2018-04-27 NOTE — Progress Notes (Signed)
To ultrasound via bed 

## 2018-04-27 NOTE — Progress Notes (Signed)
Pt is upset this morning, out of the room, slammed the door, states that she has been calling me for an hour, I have no phone calls from pt's room, pt states she send me a message with NT, pt is verbally aggressive. Pt states that she is "sick of pills", does not want any pills for pain, states "fluid through my IV works for pain " Will continue to monitor.

## 2018-04-27 NOTE — Plan of Care (Signed)
  Problem: Clinical Measurements: Goal: Ability to maintain clinical measurements within normal limits will improve Outcome: Progressing Goal: Will remain free from infection Outcome: Progressing Note:  Remains on po antibiotics, no fever. Goal: Diagnostic test results will improve Outcome: Progressing Note:  Potassium 3.4 this am Goal: Respiratory complications will improve Outcome: Progressing Goal: Cardiovascular complication will be avoided Outcome: Progressing   Problem: Pain Managment: Goal: General experience of comfort will improve Outcome: Not Progressing Note:  Frequent pain meds for abdominal pain

## 2018-04-27 NOTE — Discharge Summary (Signed)
[facility] [place] NAME: Carol Henderson    MR#:  [id number]  DATE OF BIRTH:  01-01-1966  DATE OF ADMISSION:  04/24/2018 ADMITTING PHYSICIAN: Demetrios Loll, MD  DATE OF DISCHARGE: 04/27/2018   PRIMARY CARE PHYSICIAN: Jodi Marble, MD    ADMISSION DIAGNOSIS:  Bilateral pulmonary infiltrates on chest x-ray [R91.8] Hypertensive emergency [I16.1] Chest pain, moderate coronary artery risk [R07.9]  DISCHARGE DIAGNOSIS:  Active Problems:   COPD (chronic obstructive pulmonary disease) (HCC)   Atypical chest pain   SECONDARY DIAGNOSIS:   Past Medical History:  Diagnosis Date  . Anxiety   . Bipolar 1 disorder ([place])   . COPD (chronic obstructive pulmonary disease) ([place])   . Gallstones   . GERD (gastroesophageal reflux disease)   . Heart attack ([place]) 2016  . Hep C w/o coma, chronic ([place]) 2016  . Hypertension   . 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 Patient on combined chemotherapy and radiation   . Schizophrenia ([place])   . Vulvar cancer ([place])    Radiation and ChemoRx    HOSPITAL COURSE:   *acute chest pain Cardiology input appreciated, ACS ruled out, continue aspirin, Brilinta, and Lipitor. Nuclear medicine stress testing was -April 25, 2018 Recommended outpatient follow-up with Dr. Nehemiah Massed in 1 to 2 weeks for reevaluation.  * Gastritis  likely her pain is due to Gastritis, She takes Ibuprofen 2-3 times daily.  Counseled against that.  Prescribed Protonix and sucralfate and advise to follow with GI clinic.  *acute on copd ex Resolving Continue Solu-Medrol with tapering as tolerated, breathing treatments PRN, inhaled corticosteroids, mucolytic agents. Improved now.  Stop steroids.  Continue home inhalers as before.  *acute possible pneumonitis or pneumonia Resolving Continue empiric Zithromax and Rocephin,Robitussin as needed.  *Acute upper abdominal/epigastric abdominal pain Noted history of gallstones,  LFTs normal [place] RUQ is without acute findings.  Likely gastritis as above.  *Acute accelerated hypertension Increase clonidine to 0. 2 mg twice daily, losartan, PRN IV hydralazine,   *Hypokalemia Respleted  *chronic tobacco smoking Nicotine patch and cessation counseling  *Mild dehydration Encourage oral intake.  *Chronic GAD Klonopin as needed  DISCHARGE CONDITIONS:   Stable.  CONSULTS OBTAINED:  Treatment Team:  Corey Skains, MD  DRUG ALLERGIES:   Allergies  Allergen Reactions  . Ativan [Lorazepam]     Anger    DISCHARGE MEDICATIONS:   Allergies as of 04/27/2018      Reactions   Ativan [lorazepam]    Anger      Medication List    STOP taking these medications   ibuprofen 400 MG tablet Commonly known as:  ADVIL,MOTRIN   meloxicam 15 MG tablet Commonly known as:  MOBIC     TAKE these medications   albuterol 108 (90 Base) MCG/ACT inhaler Commonly known as:  PROAIR HFA Inhale 2 puffs into the lungs every 6 (six) hours as needed for wheezing or shortness of breath.   amLODipine 10 MG tablet Commonly known as:  NORVASC Take 1 tablet (10 mg total) by mouth daily. Start taking on:  April 28, 2018   aspirin EC 81 MG tablet Take 81 mg by mouth every 6 (six) hours as needed (for pain).   atorvastatin 40 MG tablet Commonly known as:  LIPITOR Take 1 tablet (40 mg total) by mouth daily at 6 PM.   clonazePAM 1 MG tablet Commonly known as:  KLONOPIN Take 1 tablet (1 mg total) by mouth 3 (three) times daily  as needed for anxiety (anxiety).   cloNIDine 0.2 MG tablet Commonly known as:  CATAPRES Take 1 tablet (0.2 mg total) by mouth 2 (two) times daily. What changed:   medication strength how much to take   gabapentin 400 MG capsule Commonly known as:  NEURONTIN Take 1 capsule (400 mg total) by mouth 3 (three) times daily. Reported on 07/02/2015   HYDROcodone-acetaminophen 5-325 MG tablet Commonly known as:  NORCO/VICODIN Take 1 tablet by  mouth every 6 (six) hours as needed for moderate pain.   losartan 100 MG tablet Commonly known as:  COZAAR Take 1 tablet (100 mg total) by mouth daily. Start taking on:  April 28, 2018   nicotine 14 mg/24hr patch Commonly known as:  NICODERM CQ - dosed in mg/24 hours Place 1 patch (14 mg total) onto the skin daily. Start taking on:  April 28, 2018   pantoprazole 40 MG tablet Commonly known as:  PROTONIX Take 1 tablet (40 mg total) by mouth 2 (two) times daily before a meal. Reported on 07/02/2015 What changed:  when to take this   sucralfate 1 g tablet Commonly known as:  CARAFATE Take 1 tablet (1 g total) by mouth 4 (four) times daily.   ticagrelor 90 MG Tabs tablet Commonly known as:  BRILINTA Take 90 mg by mouth 2 (two) times daily.   tiotropium 18 MCG inhalation capsule Commonly known as:  SPIRIVA HANDIHALER Place 1 capsule (18 mcg total) into inhaler and inhale daily.        DISCHARGE INSTRUCTIONS:   Follow with GI clinic.  If you experience worsening of your admission symptoms, develop shortness of breath, life threatening emergency, suicidal or homicidal thoughts you must seek medical attention immediately by calling 911 or calling your MD immediately  if symptoms less severe.  You Must read complete instructions/literature along with all the possible adverse reactions/side effects for all the Medicines you take and that have been prescribed to you. Take any new Medicines after you have completely understood and accept all the possible adverse reactions/side effects.   Please note  You were cared for by a hospitalist during your hospital stay. If you have any questions about your discharge medications or the care you received while you were in the hospital after you are discharged, you can call the unit and asked to speak with the hospitalist on call if the hospitalist that took care of you is not available. Once you are discharged, your primary care physician  will handle any further medical issues. Please note that NO REFILLS for any discharge medications will be authorized once you are discharged, as it is imperative that you return to your primary care physician (or establish a relationship with a primary care physician if you do not have one) for your aftercare needs so that they can reassess your need for medications and monitor your lab values.    Today   CHIEF COMPLAINT:   Chief Complaint  Patient presents with  . Chest Pain    HISTORY OF PRESENT ILLNESS:  Carol Henderson  is a 53 y.o. female with a known history of multiple medical problems as below.  The patient presents the ED with above chief complaints.  The chest pain is on both side, intermittent, 9 out of 10 without radiation.  She also complains of fever, chills, headache, dizziness, abdominal pain, leg pain and arm pain.  Chest x-ray is unremarkable but a CAT scan of the chest report: Abnormal findings in the upper lungs  bilaterally. There is a combination of multifocal ground-glass attenuation airspace opacities, ground-glass nodules, and small irregular cystic changes. Given the distribution, an inhalational source is favored. Findings may represent smoking related Langerhans cell histiocytosis, respiratory bronchiolitis interstitial lung disease (RB-ILD), hypersensitivity pneumonitis, or a multifocal infectious process including atypical pneumonia.  She is treated with aspirin, Zithromax and Rocephin in the ED.  Dr. Jacqualine Code request admission.   VITAL SIGNS:  Blood pressure (!) 173/67, pulse 74, temperature 97.6 F (36.4 C), temperature source Oral, resp. rate 18, height 5\' 2"  (1.575 m), weight 61 kg, SpO2 100 %.  I/O:    Intake/Output Summary (Last 24 hours) at 04/27/2018 1314 Last data filed at 04/27/2018 1100 Gross per 24 hour  Intake 480 ml  Output 1500 ml  Net -1020 ml    PHYSICAL EXAMINATION:   GENERAL:  53 y.o.-year-old patient lying in the bed with no acute distress.   EYES: Pupils equal, round, reactive to light and accommodation. No scleral icterus. Extraocular muscles intact.  HEENT: Head atraumatic, normocephalic. Oropharynx and nasopharynx clear.  NECK:  Supple, no jugular venous distention. No thyroid enlargement, no tenderness.  LUNGS: Normal breath sounds bilaterally, no wheezing, rales,rhonchi or crepitation. No use of accessory muscles of respiration.  CARDIOVASCULAR: S1, S2 normal. No murmurs, rubs, or gallops.  ABDOMEN: Soft, nontender, nondistended. Bowel sounds present. No organomegaly or mass.  EXTREMITIES: No pedal edema, cyanosis, or clubbing.  NEUROLOGIC: Cranial nerves II through XII are intact. Muscle strength 5/5 in all extremities. Sensation intact. Gait not checked.  PSYCHIATRIC: The patient is alert and oriented x 3.  SKIN: No obvious rash, lesion, or ulcer.   DATA REVIEW:   CBC Recent Labs  Lab 04/25/18 0254  WBC 8.5  HGB 13.8  HCT 42.7  PLT 325    Chemistries  Recent Labs  Lab 04/24/18 1635  NA 139  K 3.4*  CL 108  CO2 25  GLUCOSE 119*  BUN 25*  CREATININE 1.16*  CALCIUM 8.7*  AST 19  ALT 17  ALKPHOS 98  BILITOT 0.4    Cardiac Enzymes Recent Labs  Lab 04/25/18 0254  TROPONINI <0.03    Microbiology Results  Results for orders placed or performed during the hospital encounter of 04/24/18  Blood Culture (routine x 2)     Status: None (Preliminary result)   Collection Time: 04/24/18  6:32 PM  Result Value Ref Range Status   Specimen Description BLOOD LEFT ANTECUBITAL  Final   Special Requests   Final    BOTTLES DRAWN AEROBIC AND ANAEROBIC Blood Culture adequate volume   Culture   Final    NO GROWTH 3 DAYS Performed at [facility], [address] [place], [place] [address]    Report Status PENDING  Incomplete  Blood Culture (routine x 2)     Status: None (Preliminary result)   Collection Time: 04/24/18  6:37 PM  Result Value Ref Range Status   Specimen Description BLOOD BLOOD LEFT  WRIST  Final   Special Requests   Final    BOTTLES DRAWN AEROBIC AND ANAEROBIC Blood Culture adequate volume   Culture   Final    NO GROWTH 3 DAYS Performed at [facility], [address], [place], [place] [address]    Report Status PENDING  Incomplete    RADIOLOGY:  US Abdomen Limited Ruq  Result Date: 04/27/2018 CLINICAL DATA:  Chronic right upper quadrant abdominal pain. EXAM: ULTRASOUND ABDOMEN LIMITED RIGHT UPPER QUADRANT COMPARISON:  None. 02/13/2016 abdominal sonogram. FINDINGS:  Gallbladder: No gallstones or wall thickening visualized. No sonographic Murphy sign noted by sonographer. Common bile duct: Diameter: 5 mm Liver: No focal lesion identified. Within normal limits in parenchymal echogenicity. Portal vein is patent on color Doppler imaging with normal direction of blood flow towards the liver. IMPRESSION: Normal right upper quadrant abdominal sonogram, with no cholelithiasis. Electronically Signed   By: Ilona Sorrel M.D.   On: 04/27/2018 08:42    EKG:   Orders placed or performed during the hospital encounter of 04/24/18  . EKG 12-Lead  . EKG 12-Lead  . ED EKG  . ED EKG      Management plans discussed with the patient, family and they are in agreement.  CODE STATUS:     Code Status Orders  (From admission, onward)         Start     Ordered   04/24/18 2131  Full code  Continuous     04/24/18 2130        Code Status History    Date Active Date Inactive Code Status Order ID Comments User Context   04/21/2017 1525 04/21/2017 2007 Full Code [id number]  Yolonda Kida, MD Inpatient   05/31/2016 1733 06/01/2016 1813 Full Code [id number]  Yolonda Kida, MD Inpatient   07/16/2015 1654 07/17/2015 1710 Full Code [id number]  Yolonda Kida, MD Inpatient      TOTAL TIME TAKING CARE OF THIS PATIENT: 35 minutes.    Vaughan Basta M.D on 04/27/2018 at 1:14 PM  Between 7am to 6pm - Pager - [phone]  After 6pm go to [email or web address] -  password [email or web address] [facility]  Office  [phone]  CC: Primary care physician; Jodi Marble, MD   Note: This dictation was prepared with Dragon dictation along with smaller phrase technology. Any transcriptional errors that result from this process are unintentional.

## 2018-04-27 NOTE — Discharge Instructions (Signed)
Follow with GI clinic in 1-2 weeks. Don't take Ibuprofen as it can make stomach issues worse.

## 2018-04-27 NOTE — Progress Notes (Signed)
Pt back from ultrasound, diet order put in.

## 2018-04-29 LAB — CULTURE, BLOOD (ROUTINE X 2)
Culture: NO GROWTH
Culture: NO GROWTH
SPECIAL REQUESTS: ADEQUATE
Special Requests: ADEQUATE

## 2018-05-04 ENCOUNTER — Encounter: Payer: Self-pay | Admitting: Gastroenterology

## 2018-05-04 ENCOUNTER — Ambulatory Visit (INDEPENDENT_AMBULATORY_CARE_PROVIDER_SITE_OTHER): Payer: Medicare Other | Admitting: Gastroenterology

## 2018-05-04 VITALS — BP 146/110 | HR 96 | Wt 130.8 lb

## 2018-05-04 DIAGNOSIS — R1013 Epigastric pain: Secondary | ICD-10-CM

## 2018-05-04 DIAGNOSIS — I774 Celiac artery compression syndrome: Secondary | ICD-10-CM | POA: Diagnosis not present

## 2018-05-04 DIAGNOSIS — I771 Stricture of artery: Secondary | ICD-10-CM
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
## 2018-05-05 NOTE — Addendum Note (Signed)
Addended by: Earl Lagos on: 05/05/2018 11:35 AM   Modules accepted: Orders

## 2018-05-13 ENCOUNTER — Encounter
Admission: EM | Disposition: A | Discharge status: Home / Self Care | Payer: Self-pay | Source: Home / Self Care | Attending: Internal Medicine

## 2018-05-13 ENCOUNTER — Inpatient Hospital Stay
Admission: EM | Admit: 2018-05-13 | Discharge: 2018-05-16 | DRG: 250 | Disposition: A | Discharge status: Home / Self Care | Payer: Medicare Other | Attending: Internal Medicine | Admitting: Internal Medicine

## 2018-05-13 DIAGNOSIS — Z9119 Patient's noncompliance with other medical treatment and regimen: Secondary | ICD-10-CM

## 2018-05-13 DIAGNOSIS — I2119 ST elevation (STEMI) myocardial infarction involving other coronary artery of inferior wall: Secondary | ICD-10-CM

## 2018-05-13 DIAGNOSIS — I97190 Other postprocedural cardiac functional disturbances following cardiac surgery: Principal | ICD-10-CM | POA: Diagnosis present

## 2018-05-13 DIAGNOSIS — I2510 Atherosclerotic heart disease of native coronary artery without angina pectoris: Secondary | ICD-10-CM | POA: Diagnosis not present

## 2018-05-13 DIAGNOSIS — Z923 Personal history of irradiation: Secondary | ICD-10-CM

## 2018-05-13 DIAGNOSIS — Z955 Presence of coronary angioplasty implant and graft: Secondary | ICD-10-CM

## 2018-05-13 DIAGNOSIS — T82867A Thrombosis of cardiac prosthetic devices, implants and grafts, initial encounter: Secondary | ICD-10-CM | POA: Diagnosis present

## 2018-05-13 DIAGNOSIS — Z79891 Long term (current) use of opiate analgesic: Secondary | ICD-10-CM

## 2018-05-13 DIAGNOSIS — Z888 Allergy status to other drugs, medicaments and biological substances status: Secondary | ICD-10-CM

## 2018-05-13 DIAGNOSIS — Z8249 Family history of ischemic heart disease and other diseases of the circulatory system: Secondary | ICD-10-CM

## 2018-05-13 DIAGNOSIS — I255 Ischemic cardiomyopathy: Secondary | ICD-10-CM | POA: Diagnosis present

## 2018-05-13 DIAGNOSIS — I2121 ST elevation (STEMI) myocardial infarction involving left circumflex coronary artery: Secondary | ICD-10-CM | POA: Diagnosis not present

## 2018-05-13 DIAGNOSIS — I213 ST elevation (STEMI) myocardial infarction of unspecified site: Secondary | ICD-10-CM | POA: Diagnosis present

## 2018-05-13 DIAGNOSIS — Z79899 Other long term (current) drug therapy: Secondary | ICD-10-CM

## 2018-05-13 DIAGNOSIS — Z8544 Personal history of malignant neoplasm of other female genital organs: Secondary | ICD-10-CM

## 2018-05-13 DIAGNOSIS — I5022 Chronic systolic (congestive) heart failure: Secondary | ICD-10-CM | POA: Diagnosis present

## 2018-05-13 DIAGNOSIS — K219 Gastro-esophageal reflux disease without esophagitis: Secondary | ICD-10-CM | POA: Diagnosis present

## 2018-05-13 DIAGNOSIS — E876 Hypokalemia: Secondary | ICD-10-CM | POA: Diagnosis present

## 2018-05-13 DIAGNOSIS — Z7902 Long term (current) use of antithrombotics/antiplatelets: Secondary | ICD-10-CM

## 2018-05-13 DIAGNOSIS — Z9221 Personal history of antineoplastic chemotherapy: Secondary | ICD-10-CM

## 2018-05-13 DIAGNOSIS — Z7982 Long term (current) use of aspirin: Secondary | ICD-10-CM

## 2018-05-13 DIAGNOSIS — E785 Hyperlipidemia, unspecified: Secondary | ICD-10-CM | POA: Diagnosis present

## 2018-05-13 DIAGNOSIS — M545 Low back pain: Secondary | ICD-10-CM | POA: Diagnosis present

## 2018-05-13 DIAGNOSIS — I259 Chronic ischemic heart disease, unspecified: Secondary | ICD-10-CM

## 2018-05-13 DIAGNOSIS — I110 Hypertensive heart disease with heart failure: Secondary | ICD-10-CM | POA: Diagnosis present

## 2018-05-13 DIAGNOSIS — I21A9 Other myocardial infarction type: Secondary | ICD-10-CM | POA: Diagnosis present

## 2018-05-13 DIAGNOSIS — B182 Chronic viral hepatitis C: Secondary | ICD-10-CM | POA: Diagnosis present

## 2018-05-13 DIAGNOSIS — Z803 Family history of malignant neoplasm of breast: Secondary | ICD-10-CM

## 2018-05-13 DIAGNOSIS — I4510 Unspecified right bundle-branch block: Secondary | ICD-10-CM | POA: Diagnosis present

## 2018-05-13 DIAGNOSIS — Y831 Surgical operation with implant of artificial internal device as the cause of abnormal reaction of the patient, or of later complication, without mention of misadventure at the time of the procedure: Secondary | ICD-10-CM | POA: Diagnosis present

## 2018-05-13 DIAGNOSIS — J449 Chronic obstructive pulmonary disease, unspecified: Secondary | ICD-10-CM | POA: Diagnosis present

## 2018-05-13 DIAGNOSIS — Z801 Family history of malignant neoplasm of trachea, bronchus and lung: Secondary | ICD-10-CM

## 2018-05-13 DIAGNOSIS — Z9114 Patient's other noncompliance with medication regimen: Secondary | ICD-10-CM

## 2018-05-13 DIAGNOSIS — G8929 Other chronic pain: Secondary | ICD-10-CM | POA: Diagnosis present

## 2018-05-13 DIAGNOSIS — F319 Bipolar disorder, unspecified: Secondary | ICD-10-CM | POA: Diagnosis present

## 2018-05-13 DIAGNOSIS — F419 Anxiety disorder, unspecified: Secondary | ICD-10-CM | POA: Diagnosis present

## 2018-05-13 DIAGNOSIS — Z716 Tobacco abuse counseling: Secondary | ICD-10-CM

## 2018-05-13 DIAGNOSIS — F1410 Cocaine abuse, uncomplicated: Secondary | ICD-10-CM | POA: Diagnosis present

## 2018-05-13 DIAGNOSIS — I252 Old myocardial infarction: Secondary | ICD-10-CM

## 2018-05-13 DIAGNOSIS — R7989 Other specified abnormal findings of blood chemistry: Secondary | ICD-10-CM | POA: Diagnosis present

## 2018-05-13 DIAGNOSIS — F17210 Nicotine dependence, cigarettes, uncomplicated: Secondary | ICD-10-CM | POA: Diagnosis present

## 2018-05-13 HISTORY — PX: CORONARY/GRAFT ACUTE MI REVASCULARIZATION: CATH118305

## 2018-05-13 HISTORY — DX: ST elevation (STEMI) myocardial infarction involving other coronary artery of inferior wall: I21.19

## 2018-05-13 HISTORY — PX: LEFT HEART CATH AND CORONARY ANGIOGRAPHY: CATH118249

## 2018-05-13 LAB — COMPREHENSIVE METABOLIC PANEL
ALT: 16 U/L (ref 0–44)
ANION GAP: 7 (ref 5–15)
AST: 17 U/L (ref 15–41)
Albumin: 3.7 g/dL (ref 3.5–5.0)
Alkaline Phosphatase: 80 U/L (ref 38–126)
BUN: 27 mg/dL — ABNORMAL HIGH (ref 6–20)
CHLORIDE: 104 mmol/L (ref 98–111)
CO2: 27 mmol/L (ref 22–32)
Calcium: 9.4 mg/dL (ref 8.9–10.3)
Creatinine, Ser: 0.94 mg/dL (ref 0.44–1.00)
GFR calc Af Amer: >60 mL/min (ref >60)
GFR calc non Af Amer: >60 mL/min (ref >60)
Glucose, Bld: 111 mg/dL — ABNORMAL HIGH (ref 70–99)
POTASSIUM: 3.5 mmol/L (ref 3.5–5.1)
Sodium: 138 mmol/L (ref 135–145)
Total Bilirubin: 0.4 mg/dL (ref 0.3–1.2)
Total Protein: 7.6 g/dL (ref 6.5–8.1)

## 2018-05-13 LAB — CBC WITH DIFFERENTIAL/PLATELET
Abs Immature Granulocytes: 0.04 10*3/uL (ref 0.00–0.07)
Basophils Absolute: 0.1 10*3/uL (ref 0.0–0.1)
Basophils Relative: 1 %
Eosinophils Absolute: 0.1 10*3/uL (ref 0.0–0.5)
Eosinophils Relative: 1 %
HCT: 41.7 % (ref 36.0–46.0)
Hemoglobin: 14 g/dL (ref 12.0–15.0)
Immature Granulocytes: 0 %
Lymphocytes Relative: 23 %
Lymphs Abs: 2.3 10*3/uL (ref 0.7–4.0)
MCH: 31 pg (ref 26.0–34.0)
MCHC: 33.6 g/dL (ref 30.0–36.0)
MCV: 92.3 fL (ref 80.0–100.0)
Monocytes Absolute: 0.7 10*3/uL (ref 0.1–1.0)
Monocytes Relative: 7 %
NEUTROS PCT: 68 %
Neutro Abs: 6.7 10*3/uL (ref 1.7–7.7)
Platelets: 377 10*3/uL (ref 150–400)
RBC: 4.52 MIL/uL (ref 3.87–5.11)
RDW: 13.2 % (ref 11.5–15.5)
WBC: 9.8 10*3/uL (ref 4.0–10.5)
nRBC: 0 % (ref 0.0–0.2)

## 2018-05-13 LAB — LIPID PANEL
CHOL/HDL RATIO: 3 ratio
Cholesterol: 181 mg/dL (ref 0–200)
HDL: 60 mg/dL (ref >40)
LDL Cholesterol: 103 mg/dL — ABNORMAL HIGH (ref 0–99)
TRIGLYCERIDES: 88 mg/dL (ref <150)
VLDL: 18 mg/dL (ref 0–40)

## 2018-05-13 LAB — PROTIME-INR
INR: 0.94
Prothrombin Time: 12.5 seconds (ref 11.4–15.2)

## 2018-05-13 LAB — APTT: APTT: 28 s (ref 24–36)

## 2018-05-13 LAB — TROPONIN I: Troponin I: <0.03 ng/mL (ref <0.03)

## 2018-05-13 SURGERY — CORONARY/GRAFT ACUTE MI REVASCULARIZATION
Anesthesia: Moderate Sedation

## 2018-05-13 MED ORDER — NITROGLYCERIN 5 MG/ML IV SOLN
INTRAVENOUS | Status: AC
  Filled 2018-05-13: qty 10

## 2018-05-13 MED ORDER — FENTANYL CITRATE (PF) 100 MCG/2ML IJ SOLN
INTRAMUSCULAR | Status: DC | PRN
  Administered 2018-05-13: 50 ug via INTRAVENOUS

## 2018-05-13 MED ORDER — VERAPAMIL HCL 2.5 MG/ML IV SOLN
INTRAVENOUS | Status: AC
  Filled 2018-05-13: qty 2

## 2018-05-13 MED ORDER — MIDAZOLAM HCL 2 MG/2ML IJ SOLN
INTRAMUSCULAR | Status: DC | PRN
  Administered 2018-05-13 (×4): 1 mg via INTRAVENOUS

## 2018-05-13 MED ORDER — TIROFIBAN HCL IN NACL 5-0.9 MG/100ML-% IV SOLN
INTRAVENOUS | Status: AC
  Filled 2018-05-13: qty 100

## 2018-05-13 MED ORDER — HEPARIN SODIUM (PORCINE) 1000 UNIT/ML IJ SOLN
INTRAMUSCULAR | Status: AC
  Filled 2018-05-13: qty 1

## 2018-05-13 MED ORDER — FENTANYL CITRATE (PF) 100 MCG/2ML IJ SOLN
INTRAMUSCULAR | Status: AC
  Filled 2018-05-13: qty 2

## 2018-05-13 MED ORDER — MIDAZOLAM HCL 2 MG/2ML IJ SOLN
INTRAMUSCULAR | Status: AC
  Filled 2018-05-13: qty 2

## 2018-05-13 MED ORDER — ATROPINE SULFATE 1 MG/10ML IJ SOSY
PREFILLED_SYRINGE | INTRAMUSCULAR | Status: AC
  Filled 2018-05-13: qty 10

## 2018-05-13 MED ORDER — ATROPINE SULFATE 1 MG/10ML IJ SOSY
PREFILLED_SYRINGE | INTRAMUSCULAR | Status: DC | PRN
  Administered 2018-05-13: 1 mg via INTRAVENOUS

## 2018-05-13 MED ORDER — BIVALIRUDIN BOLUS VIA INFUSION - CUPID
INTRAVENOUS | Status: DC | PRN
  Administered 2018-05-13: 44.25 mg via INTRAVENOUS

## 2018-05-13 MED ORDER — BIVALIRUDIN TRIFLUOROACETATE 250 MG IV SOLR
INTRAVENOUS | Status: AC
  Filled 2018-05-13: qty 250

## 2018-05-13 MED ORDER — NITROGLYCERIN 1 MG/10 ML FOR IR/CATH LAB
INTRA_ARTERIAL | Status: DC | PRN
  Administered 2018-05-13: 200 ug via INTRACORONARY
  Administered 2018-05-14: 100 ug via INTRACORONARY

## 2018-05-13 MED ORDER — SODIUM CHLORIDE 0.9 % IV SOLN
INTRAVENOUS | Status: DC | PRN
  Administered 2018-05-13: 0.25 mg/kg/h via INTRAVENOUS

## 2018-05-13 SURGICAL SUPPLY — 22 items
BALLN TREK RX 2.5X12 (BALLOONS) ×3
BALLN ~~LOC~~ TREK RX 3.0X12 (BALLOONS) ×3
BALLOON TREK RX 2.5X12 (BALLOONS) IMPLANT
BALLOON ~~LOC~~ TREK RX 3.0X12 (BALLOONS) IMPLANT
CATH EXTRAC PRONTO 5.5F 138CM (CATHETERS) ×2 IMPLANT
CATH INFINITI 5FR ANG PIGTAIL (CATHETERS) ×2 IMPLANT
CATH VISTA GUIDE 6FR JR4 (CATHETERS) ×2 IMPLANT
CATH VISTA GUIDE 6FR XB3.5 (CATHETERS) ×2 IMPLANT
DEVICE CLOSURE MYNXGRIP 6/7F (Vascular Products) ×2 IMPLANT
DEVICE INFLAT 30 PLUS (MISCELLANEOUS) ×2 IMPLANT
GLIDESHEATH SLEND SS 6F .021 (SHEATH) ×2 IMPLANT
KIT MANI 3VAL PERCEP (MISCELLANEOUS) ×3 IMPLANT
NDL PERC 18GX7CM (NEEDLE) IMPLANT
NEEDLE PERC 18GX7CM (NEEDLE) ×3 IMPLANT
PACK CARDIAC CATH (CUSTOM PROCEDURE TRAY) ×3 IMPLANT
PROTECTION STATION PRESSURIZED (MISCELLANEOUS) ×3
SET INTRO CAPELLA COAXIAL (SET/KITS/TRAYS/PACK) ×2 IMPLANT
SHEATH AVANTI 6FR X 11CM (SHEATH) ×2 IMPLANT
STATION PROTECTION PRESSURIZED (MISCELLANEOUS) IMPLANT
WIRE GUIDERIGHT .035X150 (WIRE) ×2 IMPLANT
WIRE ROSEN-J .035X260CM (WIRE) ×2 IMPLANT
WIRE RUNTHROUGH .014X180CM (WIRE) ×2 IMPLANT

## 2018-05-13 NOTE — ED Notes (Signed)
Pt being transported to cath lab at this time  

## 2018-05-13 NOTE — ED Notes (Signed)
Cardiologist in room at this time.

## 2018-05-13 NOTE — ED Notes (Addendum)
4000 units heparin given at this time 

## 2018-05-13 NOTE — ED Notes (Signed)
All jewelry and clothing removed, place in pt belonging bad and given to pt mother

## 2018-05-13 NOTE — ED Triage Notes (Signed)
Patient presented to triage POV with chest pain. EKG revealed STEMI. Immediately roomed to 8 and MD at bedside. Code STEMI called immediately.
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
## 2018-05-14 ENCOUNTER — Inpatient Hospital Stay
Admit: 2018-05-14 | Discharge: 2018-05-14 | Disposition: A | Discharge status: Home / Self Care | Payer: Medicare Other | Attending: Cardiovascular Disease | Admitting: Cardiovascular Disease

## 2018-05-14 ENCOUNTER — Other Ambulatory Visit: Payer: Self-pay

## 2018-05-14 DIAGNOSIS — Z79891 Long term (current) use of opiate analgesic: Secondary | ICD-10-CM | POA: Diagnosis not present

## 2018-05-14 DIAGNOSIS — F1410 Cocaine abuse, uncomplicated: Secondary | ICD-10-CM | POA: Diagnosis present

## 2018-05-14 DIAGNOSIS — I252 Old myocardial infarction: Secondary | ICD-10-CM | POA: Diagnosis not present

## 2018-05-14 DIAGNOSIS — B182 Chronic viral hepatitis C: Secondary | ICD-10-CM | POA: Diagnosis present

## 2018-05-14 DIAGNOSIS — Z801 Family history of malignant neoplasm of trachea, bronchus and lung: Secondary | ICD-10-CM | POA: Diagnosis not present

## 2018-05-14 DIAGNOSIS — G8929 Other chronic pain: Secondary | ICD-10-CM | POA: Diagnosis present

## 2018-05-14 DIAGNOSIS — Z8249 Family history of ischemic heart disease and other diseases of the circulatory system: Secondary | ICD-10-CM | POA: Diagnosis not present

## 2018-05-14 DIAGNOSIS — T82867A Thrombosis of cardiac prosthetic devices, implants and grafts, initial encounter: Secondary | ICD-10-CM | POA: Diagnosis present

## 2018-05-14 DIAGNOSIS — I21A9 Other myocardial infarction type: Secondary | ICD-10-CM | POA: Diagnosis present

## 2018-05-14 DIAGNOSIS — F319 Bipolar disorder, unspecified: Secondary | ICD-10-CM | POA: Diagnosis present

## 2018-05-14 DIAGNOSIS — I2119 ST elevation (STEMI) myocardial infarction involving other coronary artery of inferior wall: Secondary | ICD-10-CM | POA: Diagnosis present

## 2018-05-14 DIAGNOSIS — I5022 Chronic systolic (congestive) heart failure: Secondary | ICD-10-CM | POA: Diagnosis present

## 2018-05-14 DIAGNOSIS — I213 ST elevation (STEMI) myocardial infarction of unspecified site: Secondary | ICD-10-CM | POA: Diagnosis present

## 2018-05-14 DIAGNOSIS — F17210 Nicotine dependence, cigarettes, uncomplicated: Secondary | ICD-10-CM | POA: Diagnosis present

## 2018-05-14 DIAGNOSIS — I97190 Other postprocedural cardiac functional disturbances following cardiac surgery: Secondary | ICD-10-CM | POA: Diagnosis present

## 2018-05-14 DIAGNOSIS — Z888 Allergy status to other drugs, medicaments and biological substances status: Secondary | ICD-10-CM | POA: Diagnosis not present

## 2018-05-14 DIAGNOSIS — Z7902 Long term (current) use of antithrombotics/antiplatelets: Secondary | ICD-10-CM | POA: Diagnosis not present

## 2018-05-14 DIAGNOSIS — Z9221 Personal history of antineoplastic chemotherapy: Secondary | ICD-10-CM | POA: Diagnosis not present

## 2018-05-14 DIAGNOSIS — E785 Hyperlipidemia, unspecified: Secondary | ICD-10-CM | POA: Diagnosis present

## 2018-05-14 DIAGNOSIS — F419 Anxiety disorder, unspecified: Secondary | ICD-10-CM | POA: Diagnosis present

## 2018-05-14 DIAGNOSIS — Y831 Surgical operation with implant of artificial internal device as the cause of abnormal reaction of the patient, or of later complication, without mention of misadventure at the time of the procedure: Secondary | ICD-10-CM | POA: Diagnosis present

## 2018-05-14 DIAGNOSIS — M545 Low back pain: Secondary | ICD-10-CM | POA: Diagnosis present

## 2018-05-14 DIAGNOSIS — K219 Gastro-esophageal reflux disease without esophagitis: Secondary | ICD-10-CM | POA: Diagnosis present

## 2018-05-14 DIAGNOSIS — Z9119 Patient's noncompliance with other medical treatment and regimen: Secondary | ICD-10-CM | POA: Diagnosis not present

## 2018-05-14 DIAGNOSIS — I4510 Unspecified right bundle-branch block: Secondary | ICD-10-CM | POA: Diagnosis present

## 2018-05-14 DIAGNOSIS — Z7982 Long term (current) use of aspirin: Secondary | ICD-10-CM | POA: Diagnosis not present

## 2018-05-14 DIAGNOSIS — I255 Ischemic cardiomyopathy: Secondary | ICD-10-CM | POA: Diagnosis present

## 2018-05-14 DIAGNOSIS — Z8544 Personal history of malignant neoplasm of other female genital organs: Secondary | ICD-10-CM | POA: Diagnosis not present

## 2018-05-14 DIAGNOSIS — I2510 Atherosclerotic heart disease of native coronary artery without angina pectoris: Secondary | ICD-10-CM | POA: Diagnosis present

## 2018-05-14 DIAGNOSIS — I110 Hypertensive heart disease with heart failure: Secondary | ICD-10-CM | POA: Diagnosis present

## 2018-05-14 DIAGNOSIS — J449 Chronic obstructive pulmonary disease, unspecified: Secondary | ICD-10-CM | POA: Diagnosis present

## 2018-05-14 DIAGNOSIS — Z803 Family history of malignant neoplasm of breast: Secondary | ICD-10-CM | POA: Diagnosis not present

## 2018-05-14 DIAGNOSIS — E876 Hypokalemia: Secondary | ICD-10-CM | POA: Diagnosis present

## 2018-05-14 DIAGNOSIS — R7989 Other specified abnormal findings of blood chemistry: Secondary | ICD-10-CM | POA: Diagnosis present

## 2018-05-14 DIAGNOSIS — Z79899 Other long term (current) drug therapy: Secondary | ICD-10-CM | POA: Diagnosis not present

## 2018-05-14 DIAGNOSIS — Z923 Personal history of irradiation: Secondary | ICD-10-CM | POA: Diagnosis not present

## 2018-05-14 LAB — POCT ACTIVATED CLOTTING TIME: Activated Clotting Time: 323 seconds

## 2018-05-14 LAB — BASIC METABOLIC PANEL
Anion gap: 7 (ref 5–15)
BUN: 19 mg/dL (ref 6–20)
CO2: 25 mmol/L (ref 22–32)
Calcium: 8.8 mg/dL — ABNORMAL LOW (ref 8.9–10.3)
Chloride: 108 mmol/L (ref 98–111)
Creatinine, Ser: 0.72 mg/dL (ref 0.44–1.00)
GFR calc Af Amer: >60 mL/min (ref >60)
Glucose, Bld: 92 mg/dL (ref 70–99)
Potassium: 3.4 mmol/L — ABNORMAL LOW (ref 3.5–5.1)
Sodium: 140 mmol/L (ref 135–145)

## 2018-05-14 LAB — URINE DRUG SCREEN, QUALITATIVE (ARMC ONLY)
Amphetamines, Ur Screen: NOT DETECTED
Barbiturates, Ur Screen: NOT DETECTED
Benzodiazepine, Ur Scrn: POSITIVE — AB
Cannabinoid 50 Ng, Ur ~~LOC~~: POSITIVE — AB
Cocaine Metabolite,Ur ~~LOC~~: POSITIVE — AB
MDMA (Ecstasy)Ur Screen: NOT DETECTED
Methadone Scn, Ur: NOT DETECTED
Opiate, Ur Screen: NOT DETECTED
PHENCYCLIDINE (PCP) UR S: NOT DETECTED
Tricyclic, Ur Screen: NOT DETECTED

## 2018-05-14 LAB — CBC
HCT: 41 % (ref 36.0–46.0)
Hemoglobin: 13.7 g/dL (ref 12.0–15.0)
MCH: 30.7 pg (ref 26.0–34.0)
MCHC: 33.4 g/dL (ref 30.0–36.0)
MCV: 91.9 fL (ref 80.0–100.0)
Platelets: 360 10*3/uL (ref 150–400)
RBC: 4.46 MIL/uL (ref 3.87–5.11)
RDW: 13 % (ref 11.5–15.5)
WBC: 9.5 10*3/uL (ref 4.0–10.5)
nRBC: 0 % (ref 0.0–0.2)

## 2018-05-14 LAB — ECHOCARDIOGRAM COMPLETE
Height: 62 in
WEIGHTICAEL: 2080 [oz_av]

## 2018-05-14 LAB — GLUCOSE, CAPILLARY: Glucose-Capillary: 99 mg/dL (ref 70–99)

## 2018-05-14 LAB — MRSA PCR SCREENING: MRSA by PCR: NEGATIVE

## 2018-05-14 LAB — TROPONIN I
Troponin I: >65 ng/mL (ref <0.03)
Troponin I: >65 ng/mL (ref <0.03)

## 2018-05-14 LAB — PHOSPHORUS: PHOSPHORUS: 3.4 mg/dL (ref 2.5–4.6)

## 2018-05-14 LAB — MAGNESIUM: Magnesium: 2.2 mg/dL (ref 1.7–2.4)

## 2018-05-14 MED ORDER — SODIUM CHLORIDE 0.9 % IV SOLN: 250.0000 mL | INTRAVENOUS | Status: DC | PRN

## 2018-05-14 MED ORDER — CARVEDILOL 3.125 MG PO TABS
3.1250 mg | ORAL_TABLET | Freq: Two times a day (BID) | ORAL | Status: DC
  Administered 2018-05-14 – 2018-05-16 (×6): 3.125 mg via ORAL
  Filled 2018-05-14 (×5): qty 1

## 2018-05-14 MED ORDER — TICAGRELOR 90 MG PO TABS
90.0000 mg | ORAL_TABLET | Freq: Two times a day (BID) | ORAL | Status: DC
  Administered 2018-05-14 – 2018-05-16 (×5): 90 mg via ORAL
  Filled 2018-05-14 (×5): qty 1

## 2018-05-14 MED ORDER — LOSARTAN POTASSIUM 50 MG PO TABS
100.0000 mg | ORAL_TABLET | Freq: Every day | ORAL | Status: DC
  Administered 2018-05-14 – 2018-05-16 (×3): 100 mg via ORAL
  Filled 2018-05-14: qty 2
  Filled 2018-05-14: qty 4

## 2018-05-14 MED ORDER — ACETAMINOPHEN 325 MG PO TABS: 650.0000 mg | ORAL_TABLET | ORAL | Status: DC | PRN

## 2018-05-14 MED ORDER — CLONIDINE HCL 0.1 MG PO TABS: 0.1000 mg | ORAL_TABLET | Freq: Every day | ORAL | Status: DC

## 2018-05-14 MED ORDER — PANTOPRAZOLE SODIUM 40 MG PO TBEC
40.0000 mg | DELAYED_RELEASE_TABLET | Freq: Two times a day (BID) | ORAL | Status: DC
  Administered 2018-05-14 – 2018-05-16 (×5): 40 mg via ORAL
  Filled 2018-05-14 (×5): qty 1

## 2018-05-14 MED ORDER — HYDROCODONE-ACETAMINOPHEN 5-325 MG PO TABS
1.0000 | ORAL_TABLET | Freq: Four times a day (QID) | ORAL | Status: DC | PRN
  Administered 2018-05-14 – 2018-05-16 (×8): 1 via ORAL
  Filled 2018-05-14 (×8): qty 1

## 2018-05-14 MED ORDER — ATROPINE SULFATE 1 MG/10ML IJ SOSY
PREFILLED_SYRINGE | INTRAMUSCULAR | Status: AC
  Filled 2018-05-14: qty 10

## 2018-05-14 MED ORDER — TIROFIBAN (AGGRASTAT) BOLUS VIA INFUSION
INTRAVENOUS | Status: DC | PRN
  Administered 2018-05-14: 1475 ug via INTRAVENOUS

## 2018-05-14 MED ORDER — IOPAMIDOL (ISOVUE-300) INJECTION 61%
INTRAVENOUS | Status: DC | PRN
  Administered 2018-05-14: 170 mL

## 2018-05-14 MED ORDER — CLONAZEPAM 1 MG PO TABS
1.0000 mg | ORAL_TABLET | Freq: Three times a day (TID) | ORAL | Status: DC | PRN
  Administered 2018-05-14 – 2018-05-16 (×4): 1 mg via ORAL
  Filled 2018-05-14 (×4): qty 1

## 2018-05-14 MED ORDER — ONDANSETRON HCL 4 MG/2ML IJ SOLN: 4.0000 mg | Freq: Four times a day (QID) | INTRAMUSCULAR | Status: DC | PRN

## 2018-05-14 MED ORDER — SODIUM CHLORIDE 0.9% FLUSH
3.0000 mL | Freq: Two times a day (BID) | INTRAVENOUS | Status: DC
  Administered 2018-05-14 – 2018-05-16 (×4): 3 mL via INTRAVENOUS

## 2018-05-14 MED ORDER — ATORVASTATIN CALCIUM 20 MG PO TABS
80.0000 mg | ORAL_TABLET | Freq: Every day | ORAL | Status: DC
  Administered 2018-05-14 – 2018-05-15 (×2): 80 mg via ORAL
  Filled 2018-05-14 (×2): qty 4

## 2018-05-14 MED ORDER — CLONIDINE HCL 0.1 MG PO TABS
0.2000 mg | ORAL_TABLET | Freq: Two times a day (BID) | ORAL | Status: DC
  Administered 2018-05-14: 0.2 mg via ORAL
  Filled 2018-05-14: qty 2

## 2018-05-14 MED ORDER — ASPIRIN 81 MG PO CHEW
81.0000 mg | CHEWABLE_TABLET | Freq: Every day | ORAL | Status: DC
  Administered 2018-05-14 – 2018-05-16 (×3): 81 mg via ORAL
  Filled 2018-05-14 (×3): qty 1

## 2018-05-14 MED ORDER — AMLODIPINE BESYLATE 10 MG PO TABS
10.0000 mg | ORAL_TABLET | Freq: Every day | ORAL | Status: DC
  Administered 2018-05-14 – 2018-05-16 (×3): 10 mg via ORAL
  Filled 2018-05-14 (×3): qty 1

## 2018-05-14 MED ORDER — TICAGRELOR 90 MG PO TABS
ORAL_TABLET | ORAL | Status: DC | PRN
  Administered 2018-05-14: 180 mg via ORAL

## 2018-05-14 MED ORDER — HEPARIN (PORCINE) IN NACL 1000-0.9 UT/500ML-% IV SOLN
INTRAVENOUS | Status: DC | PRN
  Administered 2018-05-14: 1000 mL

## 2018-05-14 MED ORDER — SODIUM CHLORIDE 0.9 % WEIGHT BASED INFUSION
1.0000 mL/kg/h | INTRAVENOUS | Status: AC
  Administered 2018-05-14: 1 mL/kg/h via INTRAVENOUS

## 2018-05-14 MED ORDER — TIROFIBAN HCL IN NACL 5-0.9 MG/100ML-% IV SOLN
INTRAVENOUS | Status: AC | PRN
  Administered 2018-05-14: 0.075 ug/kg/min via INTRAVENOUS

## 2018-05-14 MED ORDER — SODIUM CHLORIDE 0.9% FLUSH: 3.0000 mL | INTRAVENOUS | Status: DC | PRN

## 2018-05-14 MED ORDER — LABETALOL HCL 5 MG/ML IV SOLN
INTRAVENOUS | Status: DC | PRN
  Administered 2018-05-14: 10 mg via INTRAVENOUS

## 2018-05-14 MED ORDER — LABETALOL HCL 5 MG/ML IV SOLN
5.0000 mg | INTRAVENOUS | Status: DC | PRN
  Administered 2018-05-14: 5 mg via INTRAVENOUS
  Filled 2018-05-14: qty 4

## 2018-05-14 MED ORDER — POTASSIUM CHLORIDE CRYS ER 20 MEQ PO TBCR
40.0000 meq | EXTENDED_RELEASE_TABLET | ORAL | Status: AC
  Administered 2018-05-14 (×2): 40 meq via ORAL
  Filled 2018-05-14 (×2): qty 2

## 2018-05-14 MED ORDER — ENOXAPARIN SODIUM 40 MG/0.4ML ~~LOC~~ SOLN
40.0000 mg | SUBCUTANEOUS | Status: DC
  Administered 2018-05-15 – 2018-05-16 (×2): 40 mg via SUBCUTANEOUS
  Filled 2018-05-14 (×2): qty 0.4

## 2018-05-14 MED ORDER — ALBUTEROL SULFATE (2.5 MG/3ML) 0.083% IN NEBU: 3.0000 mL | INHALATION_SOLUTION | Freq: Four times a day (QID) | RESPIRATORY_TRACT | Status: DC | PRN

## 2018-05-14 MED ORDER — TIROFIBAN HCL IV 12.5 MG/250 ML
0.0750 ug/kg/min | INTRAVENOUS | Status: AC
  Administered 2018-05-14: 0.075 ug/kg/min via INTRAVENOUS
  Filled 2018-05-14: qty 250

## 2018-05-14 MED ORDER — LABETALOL HCL 5 MG/ML IV SOLN
INTRAVENOUS | Status: AC
  Filled 2018-05-14: qty 4

## 2018-05-14 MED ORDER — SUCRALFATE 1 G PO TABS
1.0000 g | ORAL_TABLET | Freq: Four times a day (QID) | ORAL | Status: DC
  Administered 2018-05-14 – 2018-05-16 (×9): 1 g via ORAL
  Filled 2018-05-14 (×9): qty 1

## 2018-05-14 MED ORDER — TIOTROPIUM BROMIDE MONOHYDRATE 18 MCG IN CAPS
18.0000 ug | ORAL_CAPSULE | Freq: Every day | RESPIRATORY_TRACT | Status: DC
  Administered 2018-05-14 – 2018-05-15 (×2): 18 ug via RESPIRATORY_TRACT
  Filled 2018-05-14: qty 5

## 2018-05-14 MED ORDER — TICAGRELOR 90 MG PO TABS
ORAL_TABLET | ORAL | Status: AC
  Filled 2018-05-14: qty 2

## 2018-05-14 MED ORDER — NICOTINE 14 MG/24HR TD PT24
14.0000 mg | MEDICATED_PATCH | Freq: Every day | TRANSDERMAL | Status: DC
  Administered 2018-05-14 – 2018-05-15 (×2): 14 mg via TRANSDERMAL
  Filled 2018-05-14 (×3): qty 1
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
## 2018-05-14 NOTE — Progress Notes (Addendum)
    Critical Care Medicine     CC  STEMI s/p PCI   SUBJECTIVE Resting in bed comfortably, no new complaints.  Overnight events noted.    SIGNIFICANT EVENTS Optimizing for DC from MICU   Vitals:   05/14/18 0600 05/14/18 0800  BP: (!) 130/95 (!) 145/109  Pulse: 72 71  Resp: 19 14  Temp:  97.8 F (36.6 C)  SpO2: 96% 96%     REVIEW OF SYSTEMS  10 system ROS conducted and is negative except as per subjective findings  PHYSICAL EXAMINATION:  GENERAL: No apparent distress HEAD: Normocephalic, atraumatic.  EYES: Pupils equal, round, reactive to light.  No scleral icterus.  MOUTH: Moist mucosal membrane. NECK: Supple. No thyromegaly. No nodules. No JVD.  PULMONARY: +rhonchi at bases CARDIOVASCULAR: S1 and S2. Regular rate and rhythm. No murmurs, rubs, or gallops.  GASTROINTESTINAL: Soft, nontender, non-distended. No masses. Positive bowel sounds. No hepatosplenomegaly.  MUSCULOSKELETAL: No swelling, clubbing, or edema.  NEUROLOGIC: Mild distress due to acute illness SKIN:intact,warm,dry   Labs and Imaging:     -I personally reviewed most recent blood work, imaging and microbiology - significant findings today are hypokalemia  LAB RESULTS: Recent Labs  Lab 05/13/18 2241 05/14/18 0447  NA 138 140  K 3.5 3.4*  CL 104 108  CO2 27 25  BUN 27* 19  CREATININE 0.94 0.72  GLUCOSE 111* 92   Recent Labs  Lab 05/13/18 2241 05/14/18 0447  HGB 14.0 13.7  HCT 41.7 41.0  WBC 9.8 9.5  PLT 377 360     IMAGING RESULTS: No results found.     ASSESSMENT AND PLAN:    CARDIAC FAILURE- STEMI status post PCI -follow up cardiac enzymes as indicated ICU monitoring  Hypokalemia -Replete  COPD -Every 6 duo nebs   GI/Nutrition GI PROPHYLAXIS as indicated DIET-->TF's as tolerated Constipation protocol as indicated  ENDO - ICU hypoglycemic\Hyperglycemia protocol -check FSBS per protocol   ELECTROLYTES -follow labs as needed -replace as  needed -pharmacy consultation   DVT/GI PRX ordered -SCDs  TRANSFUSIONS AS NEEDED MONITOR FSBS ASSESS the need for LABS as needed     This document was prepared using Dragon voice recognition software and may include unintentional dictation errors.     Ottie Glazier, M.D.  Division of [place]

## 2018-05-14 NOTE — Progress Notes (Signed)
Patient briefly seen.  Family at bedside.  Patient is here with STEMI and cocaine abuse.  Continue with management by cardiology.  Tobacco dependence: Patient is encouraged to quit smoking. Counseling was provided for 4 minutes.

## 2018-05-14 NOTE — Progress Notes (Signed)
Patient Name: Carol Henderson Date of Encounter: 05/14/2018  Hospital Problem List     Active Problems:   Acute ST elevation myocardial infarction (STEMI) of inferolateral wall (HCC)   STEMI (ST elevation myocardial infarction) [facility])    Patient Profile     53 year old female with history of coronary artery disease status post ST elevation myocardial infarction occurring in the face of noncompliance with dual antiplatelet therapy.  Patient has history of known coronary disease status post PCI of the RCA and left circumflex.  She has not been compliant with her dual antiplatelet therapy.  She presented to emergency room with complaints of chest pain with evidence of inferolateral ST elevation on electrocardiogram.  She underwent emergent cath revealing a patent RCA stent with occluded left circumflex stent consistent with a late in-stent thrombosis.  She underwent aspiration thrombectomy and was to have distal embolization.  She was placed on Aggrastat which will be continued for 18 hours.  She was on losartan and was started on carvedilol.  She is significantly hypertensive and has been relatively hypertensive likely secondary to rebound hypertension.  She has some mild chest tightness.  She is currently hemodynamically stable. Subjective   Mild chest pain.  Inpatient Medications    . amLODipine  10 mg Oral Daily  . aspirin  81 mg Oral Daily  . atorvastatin  80 mg Oral q1800  . carvedilol  3.125 mg Oral BID WC  . cloNIDine  0.2 mg Oral BID  . [START ON 05/15/2018] enoxaparin (LOVENOX) injection  40 mg Subcutaneous Q24H  . losartan  100 mg Oral Daily  . nicotine  14 mg Transdermal Daily  . pantoprazole  40 mg Oral BID AC  . potassium chloride  40 mEq Oral Q4H  . sodium chloride flush  3 mL Intravenous Q12H  . sucralfate  1 g Oral QID  . ticagrelor  90 mg Oral BID  . tiotropium  18 mcg Inhalation Daily    Vital Signs    Vitals:   05/14/18 0527 05/14/18 0530 05/14/18 0600 05/14/18  0800  BP: (!) 150/93 (!) 139/97 (!) 130/95 (!) 145/109  Pulse: 65 65 72 71  Resp: 17 19 19 14   Temp:    97.8 F (36.6 C)  TempSrc:    Oral  SpO2: 97% 96% 96% 96%  Weight:      Height:        Intake/Output Summary (Last 24 hours) at 05/14/2018 0947 Last data filed at 05/14/2018 0800 Gross per 24 hour  Intake 487.27 ml  Output 900 ml  Net -412.73 ml   Filed Weights   05/13/18 2230  Weight: 59 kg    Physical Exam    GEN: Well nourished, well developed, in no acute distress.  HEENT: normal.  Neck: Supple, no JVD, carotid bruits, or masses. Cardiac: RRR, no murmurs, rubs, or gallops. No clubbing, cyanosis, edema.  Radials/DP/PT 2+ and equal bilaterally.  Respiratory:  Respirations regular and unlabored, clear to auscultation bilaterally. GI: Soft, nontender, nondistended, BS + x 4. MS: no deformity or atrophy. Skin: warm and dry, no rash. Neuro:  Strength and sensation are intact. Psych: Normal affect.  Labs    CBC Recent Labs    05/13/18 2241 05/14/18 0447  WBC 9.8 9.5  NEUTROABS 6.7  --   HGB 14.0 13.7  HCT 41.7 41.0  MCV 92.3 91.9  PLT 377 354   Basic Metabolic Panel Recent Labs    05/13/18 2241 05/14/18 0447  NA 138  140  K 3.5 3.4*  CL 104 108  CO2 27 25  GLUCOSE 111* 92  BUN 27* 19  CREATININE 0.94 0.72  CALCIUM 9.4 8.8*  MG  --  2.2  PHOS  --  3.4   Liver Function Tests Recent Labs    05/13/18 2241  AST 17  ALT 16  ALKPHOS 80  BILITOT 0.4  PROT 7.6  ALBUMIN 3.7   No results for input(s): LIPASE, AMYLASE in the last 72 hours. Cardiac Enzymes Recent Labs    05/13/18 2241 05/14/18 0447  TROPONINI <0.03 >65.00*   BNP No results for input(s): BNP in the last 72 hours. D-Dimer No results for input(s): DDIMER in the last 72 hours. Hemoglobin A1C No results for input(s): HGBA1C in the last 72 hours. Fasting Lipid Panel Recent Labs    05/13/18 2241  CHOL 181  HDL 60  LDLCALC 103*  TRIG 88  CHOLHDL 3.0   Thyroid Function  Tests No results for input(s): TSH, T4TOTAL, T3FREE, THYROIDAB in the last 72 hours.  Invalid input(s): FREET3  Telemetry    Sinus rhythm  ECG    Initially sinus rhythm with inferolateral ST elevation.  Radiology    Nm Myocar Multi W/spect W/wall Motion / Ef  Result Date: 04/25/2018  Blood pressure demonstrated a normal response to exercise.  There was no ST segment deviation noted during stress.  The study is normal.  This is a low risk study.  The left ventricular ejection fraction is normal (55-65%).    Dg Chest Portable 1 View  Result Date: 04/24/2018 CLINICAL DATA:  Chest pain and shortness of breath for 3 days, hypertension, asthma, MI, COPD, smoker EXAM: PORTABLE CHEST 1 VIEW COMPARISON:  Portable exam 1657 hours compared to 01/21/2017 FINDINGS: Normal heart size, mediastinal contours, and pulmonary vascularity. Lungs slightly hyperinflated but clear. No pulmonary infiltrate, pleural effusion or pneumothorax. Bones demineralized. IMPRESSION: No acute abnormalities. Resolution of infiltrates seen on previous exam. Electronically Signed   By: Lavonia Dana M.D.   On: 04/24/2018 17:08   Ct Angio Chest/abd/pel For Dissection W And/or Wo Contrast  Result Date: 04/24/2018 CLINICAL DATA:  53 year old female with intermittent chest pain for the past 3 days with radiation down the right arm. EXAM: CT ANGIOGRAPHY CHEST, ABDOMEN AND PELVIS TECHNIQUE: Multidetector CT imaging through the chest, abdomen and pelvis was performed using the standard protocol during bolus administration of intravenous contrast. Multiplanar reconstructed images and MIPs were obtained and reviewed to evaluate the vascular anatomy. CONTRAST:  35mL OMNIPAQUE IOHEXOL 350 MG/ML SOLN COMPARISON:  Prior CT scan of the abdomen and pelvis 06/23/2015 FINDINGS: CTA CHEST FINDINGS Cardiovascular: Initial unenhanced CT imaging of the chest demonstrates no evidence of acute intramural hematoma. Following the administration of  intravenous contrast, there is excellent opacification of the thoracic aorta. Conventional 3 vessel arch anatomy. No evidence of aortic dissection, aneurysm or penetrating ulcer. No significant atherosclerotic plaque. Calcifications present along the coronary arteries. A metallic stent is present in the proximal circumflex coronary artery. The heart is normal in size. No pericardial effusion. Mediastinum/Nodes: Unremarkable CT appearance of the thyroid gland. No suspicious mediastinal or hilar adenopathy. No soft tissue mediastinal mass. The thoracic esophagus is unremarkable. Lungs/Pleura: Multifocal geographic areas of bilateral upper lung predominant ground-glass attenuation airspace opacities with multiple small irregular associated cystic spaces. Findings predominantly affect both upper lobes as well as the superior segment of the left lower lobe. The remaining lungs are relatively spared. Diffuse mild bronchial wall thickening. Musculoskeletal: No acute  fracture or aggressive appearing lytic or blastic osseous lesion. Review of the MIP images confirms the above findings. CTA ABDOMEN AND PELVIS FINDINGS VASCULAR Aorta: Normal caliber aorta without aneurysm, dissection, vasculitis or significant stenosis. Celiac: Diffusely irregular proximal celiac artery with areas of beading/webbing visualized on the sagittal reformatted images. Suspect underlying fibromuscular dysplasia. SMA: Patent without evidence of aneurysm, dissection, vasculitis or significant stenosis. Renals: Both renal arteries are patent without evidence of aneurysm, dissection, vasculitis, fibromuscular dysplasia or significant stenosis. Fibrofatty atherosclerotic plaque results in mild focal stenosis of the proximal left renal artery. IMA: Patent without evidence of aneurysm, dissection, vasculitis or significant stenosis. Inflow: Patent without evidence of aneurysm, dissection, vasculitis or significant stenosis. Veins: No obvious venous  abnormality within the limitations of this arterial phase study. Review of the MIP images confirms the above findings. NON-VASCULAR Hepatobiliary: No focal liver abnormality is seen. No gallstones, gallbladder wall thickening, or biliary dilatation. Pancreas: Unremarkable. No pancreatic ductal dilatation or surrounding inflammatory changes. Spleen: Normal in size without focal abnormality. Adrenals/Urinary Tract: Normal adrenal glands. Mild multifocal renal cortical thinning consistent with areas of renal cortical scarring. No evidence of hydronephrosis, nephrolithiasis or enhancing renal mass. The ureters and bladder are unremarkable. Stomach/Bowel: No evidence of obstruction or focal bowel wall thickening. Normal appendix in the right lower quadrant. The terminal ileum is unremarkable. Lymphatic: No suspicious lymphadenopathy. Reproductive: Uterus and bilateral adnexa are unremarkable. Other: No abdominal wall hernia or abnormality. No abdominopelvic ascites. Musculoskeletal: No acute or significant osseous findings. 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 Review of the MIP images confirms the above findings. IMPRESSION: 1. No evidence of aortic dissection, aneurysm or acute vascular abnormality. 2. Abnormal findings in the upper lungs bilaterally. There is a combination of multifocal ground-glass attenuation airspace opacities, ground-glass nodules, and small irregular cystic changes. Given the distribution, an inhalational source is favored. Findings may represent smoking related Langerhans cell histiocytosis, respiratory bronchiolitis interstitial lung disease (RB-ILD), hypersensitivity pneumonitis, or a multifocal infectious process including atypical pneumonia. If the patient is a smoker, recommend smoking cessation and then repeat chest CT in 3 months. 3. 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 Irregular fibrofatty atherosclerotic plaque or  sequelae from prior chronic dissection are less likely considerations. 4. Mild stenosis of the proximal left renal artery. 5. Coronary artery disease with coronary stent in place. 6.  Aortic Atherosclerosis (ICD10-170.0). 7. Mild multifocal renal cortical scarring bilaterally. 8. 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 Electronically Signed   By: Jacqulynn Cadet M.D.   On: 04/24/2018 18:14   US Abdomen Limited Ruq  Result Date: 04/27/2018 CLINICAL DATA:  Chronic right upper quadrant abdominal pain. EXAM: ULTRASOUND ABDOMEN LIMITED RIGHT UPPER QUADRANT COMPARISON:  None. 02/13/2016 abdominal sonogram. FINDINGS: Gallbladder: No gallstones or wall thickening visualized. No sonographic Murphy sign noted by sonographer. Common bile duct: Diameter: 5 mm Liver: No focal lesion identified. Within normal limits in parenchymal echogenicity. Portal vein is patent on color Doppler imaging with normal direction of blood flow towards the liver. IMPRESSION: Normal right upper quadrant abdominal sonogram, with no cholelithiasis. Electronically Signed   By: Ilona Sorrel M.D.   On: 04/27/2018 08:42    Assessment & Plan    53 year old female with history of coronary disease status post PCI of the RCA and left circumflex.  Has a mild ischemic cardiomyopathy with echo pending from this admission.  Had evidence of thrombosis IntraStent.  Treated with thrombectomy and Aggrastat.  No further stenting is indicated.  Patient is currently hemodynamically stable.  Will attempt to wean off of clonidine and continue with carvedilol and losartan.  Continue with Aggrastat for 18 hours post PCI.  Compliance with dual antiplatelet therapy was again stressed.  Signed, Javier Docker Sacoya Mcgourty MD 05/14/2018, 9:47 AM  Pager: [phone]

## 2018-05-14 NOTE — Progress Notes (Signed)
*  PRELIMINARY RESULTS* Echocardiogram 2D Echocardiogram has been performed.  [place] 05/14/2018, 9:54 AM

## 2018-05-14 NOTE — Progress Notes (Signed)
   05/14/18 0100  Clinical Encounter Type  Visited With Patient and family together  Visit Type Initial  Spiritual Encounters  Spiritual Needs Prayer;Emotional  Stress Factors  Patient Stress Factors Health changes  Family Stress Factors Family relationships;Health changes  Ch responded to a RRT. Pt was in a lot of pain and was soon taken to the cath lab. Ch kept her mother [place] and escorted pt's sister upon her arrival. Carol Henderson provided a caring presence and took the family members to ICU when pt was done with the procedure. Family members shared their struggle with taking care of the pt with her mental illnesses and showed deep concern for her. Ch provided emotional support.
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
## 2018-05-15 ENCOUNTER — Encounter: Payer: Self-pay | Admitting: Cardiovascular Disease

## 2018-05-15 LAB — TROPONIN I
Troponin I: 26.24 ng/mL (ref <0.03)
Troponin I: 25.63 ng/mL (ref <0.03)
Troponin I: 20.66 ng/mL (ref <0.03)

## 2018-05-15 LAB — BASIC METABOLIC PANEL
Anion gap: 5 (ref 5–15)
BUN: 22 mg/dL — ABNORMAL HIGH (ref 6–20)
CO2: 22 mmol/L (ref 22–32)
CREATININE: 1 mg/dL (ref 0.44–1.00)
Calcium: 8.7 mg/dL — ABNORMAL LOW (ref 8.9–10.3)
Chloride: 112 mmol/L — ABNORMAL HIGH (ref 98–111)
GFR calc Af Amer: >60 mL/min (ref >60)
GFR calc non Af Amer: >60 mL/min (ref >60)
Glucose, Bld: 81 mg/dL (ref 70–99)
Potassium: 4.2 mmol/L (ref 3.5–5.1)
Sodium: 139 mmol/L (ref 135–145)

## 2018-05-15 LAB — PHOSPHORUS: Phosphorus: 2.2 mg/dL — ABNORMAL LOW (ref 2.5–4.6)

## 2018-05-15 LAB — MAGNESIUM: Magnesium: 2.2 mg/dL (ref 1.7–2.4)

## 2018-05-15 MED ORDER — CLONIDINE HCL 0.1 MG PO TABS
0.1000 mg | ORAL_TABLET | Freq: Two times a day (BID) | ORAL | Status: DC
  Administered 2018-05-15 – 2018-05-16 (×3): 0.1 mg via ORAL
  Filled 2018-05-15 (×3): qty 1

## 2018-05-15 NOTE — Plan of Care (Signed)
  Problem: Education: Goal: Knowledge of General Education information will improve Description Including pain rating scale, medication(s)/side effects and non-pharmacologic comfort measures Outcome: Progressing   Problem: Health Behavior/Discharge Planning: Goal: Ability to manage health-related needs will improve Outcome: Progressing   Problem: Clinical Measurements: Goal: Respiratory complications will improve Outcome: Progressing Goal: Cardiovascular complication will be avoided Outcome: Progressing   Problem: Activity: Goal: Risk for activity intolerance will decrease Outcome: Progressing   Problem: Safety: Goal: Ability to remain free from injury will improve Outcome: Progressing

## 2018-05-15 NOTE — Progress Notes (Signed)
Carol Henderson at [place] NAME: Carol Henderson    MR#:  [id number]  DATE OF BIRTH:  1965/04/27  SUBJECTIVE:   Patient without chest pain overnight.  REVIEW OF SYSTEMS:    Review of Systems  Constitutional: Negative for fever, chills weight loss HENT: Negative for ear pain, nosebleeds, congestion, facial swelling, rhinorrhea, neck pain, neck stiffness and ear discharge.   Respiratory: Negative for cough, shortness of breath, wheezing  Cardiovascular: Negative for chest pain, palpitations and leg swelling.  Gastrointestinal: Negative for heartburn, abdominal pain, vomiting, diarrhea or consitpation Genitourinary: Negative for dysuria, urgency, frequency, hematuria Musculoskeletal: Negative for back pain or joint pain Neurological: Negative for dizziness, seizures, syncope, focal weakness,  numbness and headaches.  Hematological: Does not bruise/bleed easily.  Psychiatric/Behavioral: Negative for hallucinations, confusion, dysphoric mood    Tolerating Diet: yes      DRUG ALLERGIES:   Allergies  Allergen Reactions  . Ativan [Lorazepam]     Anger    VITALS:  Blood pressure 114/76, pulse 77, temperature 98.1 F (36.7 C), temperature source Oral, resp. rate 19, height 5\' 2"  (1.575 m), weight 57.9 kg, SpO2 97 %.  PHYSICAL EXAMINATION:  Constitutional: Appears well-developed and well-nourished. No distress. HENT: Normocephalic. Marland Kitchen Oropharynx is clear and moist.  Eyes: Conjunctivae and EOM are normal. PERRLA, no scleral icterus.  Neck: Normal ROM. Neck supple. No JVD. No tracheal deviation. CVS: RRR, S1/S2 +, no murmurs, no gallops, no carotid bruit.  Pulmonary: Effort and breath sounds normal, no stridor, rhonchi, wheezes, rales.  Abdominal: Soft. BS +,  no distension, tenderness, rebound or guarding.  Musculoskeletal: Normal range of motion. No edema and no tenderness.  Neuro: Alert. CN 2-12 grossly intact. No focal deficits. Skin: Skin  is warm and dry. No rash noted. Psychiatric: Normal mood and affect.      LABORATORY PANEL:   CBC Recent Labs  Lab 05/14/18 0447  WBC 9.5  HGB 13.7  HCT 41.0  PLT 360   ------------------------------------------------------------------------------------------------------------------  Chemistries  Recent Labs  Lab 05/13/18 2241  05/15/18 0456  NA 138   < > 139  K 3.5   < > 4.2  CL 104   < > 112*  CO2 27   < > 22  GLUCOSE 111*   < > 81  BUN 27*   < > 22*  CREATININE 0.94   < > 1.00  CALCIUM 9.4   < > 8.7*  MG  --    < > 2.2  AST 17  --   --   ALT 16  --   --   ALKPHOS 80  --   --   BILITOT 0.4  --   --    < > = values in this interval not displayed.   ------------------------------------------------------------------------------------------------------------------  Cardiac Enzymes Recent Labs  Lab 05/14/18 0447 05/14/18 1053 05/14/18 1651  TROPONINI >65.00* >65.00* >65.00*   ------------------------------------------------------------------------------------------------------------------  RADIOLOGY:  No results found.   ASSESSMENT AND PLAN:   53 year old female with recent stent in RCA and left circumflex who presented with chest pain and found of acute STEMI of inferior lateral wall  1.  Acute STEMI inferior lateral wall with restent stenosis: This is due to noncompliance of medical management. Continue Coreg, aspirin, statin, Brilinta She needs dual antiplatelet therapy for at least 1 year. Patient referred to cardiac rehab upon discharge 2.  Cocaine abuse: Patient counseled on importance of remaining abstinent from illicit drugs.  3.  COPD without signs  of exacerbation  4.  Essential hypertension: Continue losartan, Coreg and weaning clonidine as tolerated as per cardiology.  5 .  GERD: Continue Carafate and PPI  6.  Hypokalemia: Repleted Management plans discussed with the patient and she is in agreement.  CODE STATUS: full  TOTAL TIME  TAKING CARE OF THIS PATIENT: 30 minutes.     POSSIBLE D/C tomorrow, DEPENDING ON CLINICAL CONDITION.   Carol Henderson M.D on 05/15/2018 at 11:13 AM  Between 7am to 6pm - Pager - [phone] After 6pm go to [email or web address] - password [email or web address] [facility]  Office  [phone]  CC: Primary care physician; Carol Marble, MD  Note: This dictation was prepared with Dragon dictation along with smaller phrase technology. Any transcriptional errors that result from this process are unintentional.

## 2018-05-15 NOTE — Progress Notes (Addendum)
[facility] Cardiology  SUBJECTIVE: Ms. Mckiver is a 53 year old female with a past medical history significant for coronary artery disease s/p PCI of RCA and left circumflex who presented to the ED on 05/13/18 with chest pain and evidence of inferolateral ST elevation on ECG.  She underwent emergent cardiac catheterization with an occluded left circumflex consistent with a late in-stent thrombosis. Treated with thrombectomy as well as Aggrastat for.  Prior to admission, admits to non-compliance with dual antiplatelet therapy.    Today, Ms. Koone is sitting up, comfortably and denies recurrent chest pain.  Denies palpitations/heart racing sensation.  Denies bleeding/drainage from right femoral access site. Admits to mild shortness of breath.  Denies lower extremity swelling.    Vitals:   05/15/18 0300 05/15/18 0400 05/15/18 0500 05/15/18 0600  BP: 111/76 115/79  (!) 120/102  Pulse: 70 66 67 (!) 54  Resp: 13 19 (!) 25 17  Temp:      TempSrc:      SpO2: 96% 95% 93% 97%  Weight:      Height:         Intake/Output Summary (Last 24 hours) at 05/15/2018 0834 Last data filed at 05/15/2018 0500 Gross per 24 hour  Intake 895.26 ml  Output 550 ml  Net 345.26 ml      PHYSICAL EXAM  General: Well developed, well nourished, in no acute distress HEENT:  Normocephalic and atramatic Neck:  No JVD.  Lungs: Clear bilaterally to auscultation and percussion. Heart: HRRR . Normal S1 and S2 without gallops or murmurs.  Abdomen: Bowel sounds are positive, abdomen soft and non-tender  Msk:  Back normal, normal gait. Normal strength and tone for age. Extremities: No clubbing, cyanosis or edema.   Neuro: Alert and oriented X 3. Psych:  Good affect, responds appropriately   LABS: Basic Metabolic Panel: Recent Labs    05/14/18 0447 05/15/18 0456  NA 140 139  K 3.4* 4.2  CL 108 112*  CO2 25 22  GLUCOSE 92 81  BUN 19 22*  CREATININE 0.72 1.00  CALCIUM 8.8* 8.7*  MG 2.2 2.2  PHOS 3.4 2.2*   Liver  Function Tests: Recent Labs    05/13/18 2241  AST 17  ALT 16  ALKPHOS 80  BILITOT 0.4  PROT 7.6  ALBUMIN 3.7   No results for input(s): LIPASE, AMYLASE in the last 72 hours. CBC: Recent Labs    05/13/18 2241 05/14/18 0447  WBC 9.8 9.5  NEUTROABS 6.7  --   HGB 14.0 13.7  HCT 41.7 41.0  MCV 92.3 91.9  PLT 377 360   Cardiac Enzymes: Recent Labs    05/14/18 0447 05/14/18 1053 05/14/18 1651  TROPONINI >65.00* >65.00* >65.00*   BNP: Invalid input(s): POCBNP D-Dimer: No results for input(s): DDIMER in the last 72 hours. Hemoglobin A1C: No results for input(s): HGBA1C in the last 72 hours. Fasting Lipid Panel: Recent Labs    05/13/18 2241  CHOL 181  HDL 60  LDLCALC 103*  TRIG 88  CHOLHDL 3.0   Thyroid Function Tests: No results for input(s): TSH, T4TOTAL, T3FREE, THYROIDAB in the last 72 hours.  Invalid input(s): FREET3 Anemia Panel: No results for input(s): VITAMINB12, FOLATE, FERRITIN, TIBC, IRON, RETICCTPCT in the last 72 hours.  No results found.   Echo: Normal LV function with an EF estimated at 50-55%. Mild LVH.  Evidence of impaired diastolic relaxation.   TELEMETRY: Normal sinus rhythm   ASSESSMENT AND PLAN:  Active Problems:   Acute ST elevation  myocardial infarction (STEMI) of inferolateral wall (HCC)   STEMI (ST elevation myocardial infarction) ([place])    1.  STEMI  -Continue carvedilol, losartan, atorvastatin   -Stressed the importance of compliance with dual anti-platelet therapy upon discharge  -Stressed the importance of refraining from cocaine/tobacco use   -Follow up with Dr. Clayborn Bigness 1 week post discharge   2. Tobacco abuse  -Strongly encouraged smoking cessation   The history, physical exam findings, and plan of care were all discussed with Dr. Bartholome Bill, and all decision making was made in collaboration.   Avie Arenas  PA-C 05/15/2018 8:34 AM   Patient seen and examined.  Agree with above plan.  Continue with  current medications and consider discharge tomorrow if stable.

## 2018-05-15 NOTE — Progress Notes (Signed)
Ch visited pt based on the report shared during progress rounds. Pt had a positive affect and welcomed the company of a chaplain. Pt shared that she was lonely and concerned about the pain that she has been experiencing in her back and stomach area. Pt also shared that she gets frustrated when she has to hv an IV placed in her arm but was apologetic about how she may have responded to staff regarding having a line placed. Pt shared that she had 2 daughters. One visited her and she hopes to see the younger daughter today. 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 Pt shared that she stopped taking her meds because she was tried of taking so may and what she was currently on made her feel bad, cause her to drool, and forgetful. Pt stated she has been on meds for her mental health for over 25 years and reiterated that she is in constant pain for her back and stomach. Ch assisted pt with clarifying esophagus procedure that was scheduled for 2.11.20 but the pt mother cancelled the appt. Pt was encouraged by the ch and nurse to order water w/ her meal. CH noticed pt mainly drinks dark liquids (decaf coffee/soda). Pt would benefit from having a referral to outpatient counseling services. F/u recommended.     05/15/18 1410  Clinical Encounter Type  Visited With Patient  Visit Type Spiritual support;Social support;Psychological support  Referral From Care management  Consult/Referral To Chaplain  Recommendations PHQ-9 assessment/ counseling   Spiritual Encounters  Spiritual Needs Emotional;Grief support  Stress Factors  Patient Stress Factors Exhausted;Family relationships;Health changes;Loss of control;Major life changes  Family Stress Factors Family relationships;Health changes;Major life changes

## 2018-05-15 NOTE — Care Management (Signed)
Message left on Dr. Bailey Mech nurse line to check patient status with his office and for follow up appointment as patient has presented again to [facility].

## 2018-05-15 NOTE — Progress Notes (Signed)
Pt being transferred to room 232. Report called to [place], Therapist, sports. Pt and belongings transferred to room 232 without incident.

## 2018-05-16 ENCOUNTER — Encounter (INDEPENDENT_AMBULATORY_CARE_PROVIDER_SITE_OTHER): Payer: Medicare Other | Admitting: Vascular Surgery

## 2018-05-16 ENCOUNTER — Telehealth: Payer: Self-pay

## 2018-05-16 LAB — BASIC METABOLIC PANEL
ANION GAP: 6 (ref 5–15)
BUN: 22 mg/dL — ABNORMAL HIGH (ref 6–20)
CO2: 23 mmol/L (ref 22–32)
Calcium: 8.9 mg/dL (ref 8.9–10.3)
Chloride: 110 mmol/L (ref 98–111)
Creatinine, Ser: 1.07 mg/dL — ABNORMAL HIGH (ref 0.44–1.00)
GFR calc Af Amer: >60 mL/min (ref >60)
GFR, EST NON AFRICAN AMERICAN: 60 mL/min — AB (ref >60)
Glucose, Bld: 93 mg/dL (ref 70–99)
Potassium: 4 mmol/L (ref 3.5–5.1)
Sodium: 139 mmol/L (ref 135–145)

## 2018-05-16 MED ORDER — ATORVASTATIN CALCIUM 80 MG PO TABS: 80.0000 mg | ORAL_TABLET | Freq: Every day | ORAL | 0 refills | Status: DC

## 2018-05-16 MED ORDER — NITROGLYCERIN 0.4 MG SL SUBL: 0.4000 mg | SUBLINGUAL_TABLET | SUBLINGUAL | 0 refills | Status: DC | PRN

## 2018-05-16 MED ORDER — CARVEDILOL 3.125 MG PO TABS: 3.1250 mg | ORAL_TABLET | Freq: Two times a day (BID) | ORAL | 0 refills | Status: DC

## 2018-05-16 MED ORDER — CLONIDINE HCL 0.1 MG PO TABS: 0.1000 mg | ORAL_TABLET | Freq: Two times a day (BID) | ORAL | 11 refills | Status: DC

## 2018-05-16 MED ORDER — SODIUM CHLORIDE 0.9 % IV SOLN: INTRAVENOUS | Status: DC

## 2018-05-16 NOTE — Progress Notes (Signed)
Patient Name: Carol Henderson Date of Encounter: 05/16/2018  Hospital Problem List     Active Problems:   Acute ST elevation myocardial infarction (STEMI) of inferolateral wall (HCC)   STEMI (ST elevation myocardial infarction) [facility])    Patient Profile     53 year old female with history of polysubstance abuse status post ST elevation myocardial infarction.  Status post previous stenting of the RCA and left circumflex with restenosis of circumflex secondary to noncompliance with medications.  Subjective   No chest pain.  No cardiac complaints.  Inpatient Medications    . amLODipine  10 mg Oral Daily  . aspirin  81 mg Oral Daily  . atorvastatin  80 mg Oral q1800  . carvedilol  3.125 mg Oral BID WC  . cloNIDine  0.1 mg Oral BID  . enoxaparin (LOVENOX) injection  40 mg Subcutaneous Q24H  . losartan  100 mg Oral Daily  . nicotine  14 mg Transdermal Daily  . pantoprazole  40 mg Oral BID AC  . sodium chloride flush  3 mL Intravenous Q12H  . sucralfate  1 g Oral QID  . ticagrelor  90 mg Oral BID  . tiotropium  18 mcg Inhalation Daily    Vital Signs    Vitals:   05/15/18 1932 05/16/18 0517 05/16/18 0520 05/16/18 0724  BP: 118/82  96/69 96/70  Pulse: 80  80 72  Resp: 20   19  Temp: 97.8 F (36.6 C)  98.5 F (36.9 C) 98.3 F (36.8 C)  TempSrc: Oral  Oral Oral  SpO2: 100%  98% 98%  Weight:  58.4 kg    Height:        Intake/Output Summary (Last 24 hours) at 05/16/2018 0750 Last data filed at 05/16/2018 0558 Gross per 24 hour  Intake 733 ml  Output 2550 ml  Net -1817 ml   Filed Weights   05/13/18 2230 05/15/18 1023 05/16/18 0517  Weight: 59 kg 57.9 kg 58.4 kg    Physical Exam    GEN: Well nourished, well developed, in no acute distress.  HEENT: normal.  Neck: Supple, no JVD, carotid bruits, or masses. Cardiac: RRR, no murmurs, rubs, or gallops. No clubbing, cyanosis, edema.  Radials/DP/PT 2+ and equal bilaterally.  Respiratory:  Respirations regular and  unlabored, clear to auscultation bilaterally. GI: Soft, nontender, nondistended, BS + x 4. MS: no deformity or atrophy. Skin: warm and dry, no rash. Neuro:  Strength and sensation are intact. Psych: Normal affect.  Labs    CBC Recent Labs    05/13/18 2241 05/14/18 0447  WBC 9.8 9.5  NEUTROABS 6.7  --   HGB 14.0 13.7  HCT 41.7 41.0  MCV 92.3 91.9  PLT 377 426   Basic Metabolic Panel Recent Labs    05/14/18 0447 05/15/18 0456 05/16/18 0257  NA 140 139 139  K 3.4* 4.2 4.0  CL 108 112* 110  CO2 25 22 23   GLUCOSE 92 81 93  BUN 19 22* 22*  CREATININE 0.72 1.00 1.07*  CALCIUM 8.8* 8.7* 8.9  MG 2.2 2.2  --   PHOS 3.4 2.2*  --    Liver Function Tests Recent Labs    05/13/18 2241  AST 17  ALT 16  ALKPHOS 80  BILITOT 0.4  PROT 7.6  ALBUMIN 3.7   No results for input(s): LIPASE, AMYLASE in the last 72 hours. Cardiac Enzymes Recent Labs    05/15/18 1138 05/15/18 1706 05/15/18 2304  TROPONINI 26.24* 25.63* 20.66*   BNP  No results for input(s): BNP in the last 72 hours. D-Dimer No results for input(s): DDIMER in the last 72 hours. Hemoglobin A1C No results for input(s): HGBA1C in the last 72 hours. Fasting Lipid Panel Recent Labs    05/13/18 2241  CHOL 181  HDL 60  LDLCALC 103*  TRIG 88  CHOLHDL 3.0   Thyroid Function Tests No results for input(s): TSH, T4TOTAL, T3FREE, THYROIDAB in the last 72 hours.  Invalid input(s): FREET3  Telemetry    Normal sinus rhythm  ECG    Sinus rhythm with no ischemia.  Radiology    Nm Myocar Multi W/spect W/wall Motion / Ef  Result Date: 04/25/2018  Blood pressure demonstrated a normal response to exercise.  There was no ST segment deviation noted during stress.  The study is normal.  This is a low risk study.  The left ventricular ejection fraction is normal (55-65%).    Dg Chest Portable 1 View  Result Date: 04/24/2018 CLINICAL DATA:  Chest pain and shortness of breath for 3 days, hypertension,  asthma, MI, COPD, smoker EXAM: PORTABLE CHEST 1 VIEW COMPARISON:  Portable exam 1657 hours compared to 01/21/2017 FINDINGS: Normal heart size, mediastinal contours, and pulmonary vascularity. Lungs slightly hyperinflated but clear. No pulmonary infiltrate, pleural effusion or pneumothorax. Bones demineralized. IMPRESSION: No acute abnormalities. Resolution of infiltrates seen on previous exam. Electronically Signed   By: Lavonia Dana M.D.   On: 04/24/2018 17:08   Ct Angio Chest/abd/pel For Dissection W And/or Wo Contrast  Result Date: 04/24/2018 CLINICAL DATA:  53 year old female with intermittent chest pain for the past 3 days with radiation down the right arm. EXAM: CT ANGIOGRAPHY CHEST, ABDOMEN AND PELVIS TECHNIQUE: Multidetector CT imaging through the chest, abdomen and pelvis was performed using the standard protocol during bolus administration of intravenous contrast. Multiplanar reconstructed images and MIPs were obtained and reviewed to evaluate the vascular anatomy. CONTRAST:  72mL OMNIPAQUE IOHEXOL 350 MG/ML SOLN COMPARISON:  Prior CT scan of the abdomen and pelvis 06/23/2015 FINDINGS: CTA CHEST FINDINGS Cardiovascular: Initial unenhanced CT imaging of the chest demonstrates no evidence of acute intramural hematoma. Following the administration of intravenous contrast, there is excellent opacification of the thoracic aorta. Conventional 3 vessel arch anatomy. No evidence of aortic dissection, aneurysm or penetrating ulcer. No significant atherosclerotic plaque. Calcifications present along the coronary arteries. A metallic stent is present in the proximal circumflex coronary artery. The heart is normal in size. No pericardial effusion. Mediastinum/Nodes: Unremarkable CT appearance of the thyroid gland. No suspicious mediastinal or hilar adenopathy. No soft tissue mediastinal mass. The thoracic esophagus is unremarkable. Lungs/Pleura: Multifocal geographic areas of bilateral upper lung predominant  ground-glass attenuation airspace opacities with multiple small irregular associated cystic spaces. Findings predominantly affect both upper lobes as well as the superior segment of the left lower lobe. The remaining lungs are relatively spared. Diffuse mild bronchial wall thickening. Musculoskeletal: No acute fracture or aggressive appearing lytic or blastic osseous lesion. Review of the MIP images confirms the above findings. CTA ABDOMEN AND PELVIS FINDINGS VASCULAR Aorta: Normal caliber aorta without aneurysm, dissection, vasculitis or significant stenosis. Celiac: Diffusely irregular proximal celiac artery with areas of beading/webbing visualized on the sagittal reformatted images. Suspect underlying fibromuscular dysplasia. SMA: Patent without evidence of aneurysm, dissection, vasculitis or significant stenosis. Renals: Both renal arteries are patent without evidence of aneurysm, dissection, vasculitis, fibromuscular dysplasia or significant stenosis. Fibrofatty atherosclerotic plaque results in mild focal stenosis of the proximal left renal artery. IMA: Patent without evidence of aneurysm,  dissection, vasculitis or significant stenosis. Inflow: Patent without evidence of aneurysm, dissection, vasculitis or significant stenosis. Veins: No obvious venous abnormality within the limitations of this arterial phase study. Review of the MIP images confirms the above findings. NON-VASCULAR Hepatobiliary: No focal liver abnormality is seen. No gallstones, gallbladder wall thickening, or biliary dilatation. Pancreas: Unremarkable. No pancreatic ductal dilatation or surrounding inflammatory changes. Spleen: Normal in size without focal abnormality. Adrenals/Urinary Tract: Normal adrenal glands. Mild multifocal renal cortical thinning consistent with areas of renal cortical scarring. No evidence of hydronephrosis, nephrolithiasis or enhancing renal mass. The ureters and bladder are unremarkable. Stomach/Bowel: No evidence  of obstruction or focal bowel wall thickening. Normal appendix in the right lower quadrant. The terminal ileum is unremarkable. Lymphatic: No suspicious lymphadenopathy. Reproductive: Uterus and bilateral adnexa are unremarkable. Other: No abdominal wall hernia or abnormality. No abdominopelvic ascites. Musculoskeletal: No acute or significant osseous findings. 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 Review of the MIP images confirms the above findings. IMPRESSION: 1. No evidence of aortic dissection, aneurysm or acute vascular abnormality. 2. Abnormal findings in the upper lungs bilaterally. There is a combination of multifocal ground-glass attenuation airspace opacities, ground-glass nodules, and small irregular cystic changes. Given the distribution, an inhalational source is favored. Findings may represent smoking related Langerhans cell histiocytosis, respiratory bronchiolitis interstitial lung disease (RB-ILD), hypersensitivity pneumonitis, or a multifocal infectious process including atypical pneumonia. If the patient is a smoker, recommend smoking cessation and then repeat chest CT in 3 months. 3. 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 Irregular fibrofatty atherosclerotic plaque or sequelae from prior chronic dissection are less likely considerations. 4. Mild stenosis of the proximal left renal artery. 5. Coronary artery disease with coronary stent in place. 6.  Aortic Atherosclerosis (ICD10-170.0). 7. Mild multifocal renal cortical scarring bilaterally. 8. 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 Electronically Signed   By: Jacqulynn Cadet M.D.   On: 04/24/2018 18:14   US Abdomen Limited Ruq  Result Date: 04/27/2018 CLINICAL DATA:  Chronic right upper quadrant abdominal pain. EXAM: ULTRASOUND ABDOMEN LIMITED RIGHT UPPER QUADRANT COMPARISON:  None. 02/13/2016 abdominal  sonogram. FINDINGS: Gallbladder: No gallstones or wall thickening visualized. No sonographic Murphy sign noted by sonographer. Common bile duct: Diameter: 5 mm Liver: No focal lesion identified. Within normal limits in parenchymal echogenicity. Portal vein is patent on color Doppler imaging with normal direction of blood flow towards the liver. IMPRESSION: Normal right upper quadrant abdominal sonogram, with no cholelithiasis. Electronically Signed   By: Ilona Sorrel M.D.   On: 04/27/2018 08:42    Assessment & Plan    53 year old female with history of ST elevation myocardial infarction.  Status post PCI of the RCA and left circumflex in the past.  Noncompliant with antiplatelet therapy.  Doing well post PCI of left circumflex.  She is doing well post MI.  No further chest pain.  Would continue with current medications including dual antiplatelet therapy for at least 1 year.  Encouraged hydration as creatinine slightly elevated.  Would ambulate today and consider discharge if stable.   Substance abuse-cautioned on cessation and methods to do this. Signed,  Javier Docker Merrissa Giacobbe MD 05/16/2018, 7:50 AM  Pager: [phone]

## 2018-05-16 NOTE — Discharge Summary (Signed)
[facility] [place] NAME: Carol Henderson    MR#:  [id number]  DATE OF BIRTH:  1965/04/06  DATE OF ADMISSION:  05/13/2018 ADMITTING PHYSICIAN: Harrie Foreman, MD  DATE OF DISCHARGE:05/16/2018 10:12 AM  PRIMARY CARE PHYSICIAN: Jodi Marble, MD    ADMISSION DIAGNOSIS:  Acute ST elevation myocardial infarction (STEMI), unspecified artery (HCC) [I21.3] Chest pain due to myocardial ischemia, unspecified ischemic chest pain type [I25.9] Acute ST elevation myocardial infarction (STEMI) of inferolateral wall (HCC) [I21.19]  DISCHARGE DIAGNOSIS:  Active Problems:   Acute ST elevation myocardial infarction (STEMI) of inferolateral wall (HCC)   STEMI (ST elevation myocardial infarction) ([place])   SECONDARY DIAGNOSIS:   Past Medical History:  Diagnosis Date  . Anxiety   . Bipolar 1 disorder ([place])   . COPD (chronic obstructive pulmonary disease) ([place])   . Gallstones   . GERD (gastroesophageal reflux disease)   . Heart attack ([place]) 2016  . Hep C w/o coma, chronic ([place]) 2016  . Hypertension   . 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 Patient on combined chemotherapy and radiation   . Schizophrenia ([place])   . Vulvar cancer ([place])    Radiation and ChemoRx    HOSPITAL COURSE:   53 year old female with recent stent in RCA and left circumflex who presented with chest pain and found of acute STEMI of inferior lateral wall  1.  Acute STEMI inferior lateral wall with restent stenosis: This is due to noncompliance of medical management. Continue Coreg, aspirin, statin, Brilinta She needs dual antiplatelet therapy for at least 1 year. Patient referred to cardiac rehab upon discharge   2.  Cocaine abuse: Patient counseled on importance of remaining abstinent from illicit drugs.  3.  COPD without signs of exacerbation  4.  Essential hypertension: Continue losartan, Coreg and weaning clonidine as tolerated as per cardiology.  5 .  GERD:  Continue Carafate and PPI  6.  Hypokalemia: Repleted   DISCHARGE CONDITIONS AND DIET:   Stable Cardiac diet  CONSULTS OBTAINED:  Treatment Team:  Teodoro Spray, MD  DRUG ALLERGIES:   Allergies  Allergen Reactions  . Ativan [Lorazepam]     Anger    DISCHARGE MEDICATIONS:   Allergies as of 05/16/2018      Reactions   Ativan [lorazepam]    Anger      Medication List    STOP taking these medications   HYDROcodone-acetaminophen 5-325 MG tablet Commonly known as:  NORCO/VICODIN     TAKE these medications   albuterol 108 (90 Base) MCG/ACT inhaler Commonly known as:  PROAIR HFA Inhale 2 puffs into the lungs every 6 (six) hours as needed for wheezing or shortness of breath.   amLODipine 10 MG tablet Commonly known as:  NORVASC Take 1 tablet (10 mg total) by mouth daily.   aspirin EC 81 MG tablet Take 81 mg by mouth every 6 (six) hours as needed (for pain).   atorvastatin 80 MG tablet Commonly known as:  LIPITOR Take 1 tablet (80 mg total) by mouth daily at 6 PM. What changed:    medication strength  how much to take   carvedilol 3.125 MG tablet Commonly known as:  COREG Take 1 tablet (3.125 mg total) by mouth 2 (two) times daily with a meal.   clonazePAM 1 MG tablet Commonly known as:  KLONOPIN Take 1 tablet (1 mg total) by mouth 3 (three) times daily as needed for anxiety (anxiety).   cloNIDine 0.1  MG tablet Commonly known as:  CATAPRES Take 1 tablet (0.1 mg total) by mouth 2 (two) times daily. What changed:    medication strength  how much to take   gabapentin 400 MG capsule Commonly known as:  NEURONTIN Take 1 capsule (400 mg total) by mouth 3 (three) times daily. Reported on 07/02/2015   losartan 100 MG tablet Commonly known as:  COZAAR Take 1 tablet (100 mg total) by mouth daily.   nicotine 14 mg/24hr patch Commonly known as:  NICODERM CQ - dosed in mg/24 hours Place 1 patch (14 mg total) onto the skin daily.   nitroGLYCERIN 0.4 MG  SL tablet Commonly known as:  NITROSTAT Place 1 tablet (0.4 mg total) under the tongue every 5 (five) minutes as needed for chest pain.   pantoprazole 40 MG tablet Commonly known as:  PROTONIX Take 1 tablet (40 mg total) by mouth 2 (two) times daily before a meal. Reported on 07/02/2015   sucralfate 1 g tablet Commonly known as:  CARAFATE Take 1 tablet (1 g total) by mouth 4 (four) times daily.   ticagrelor 90 MG Tabs tablet Commonly known as:  BRILINTA Take 90 mg by mouth 2 (two) times daily.   tiotropium 18 MCG inhalation capsule Commonly known as:  SPIRIVA HANDIHALER Place 1 capsule (18 mcg total) into inhaler and inhale daily.         Today   CHIEF COMPLAINT:  No CP overnight no SOB wants to go home   VITAL SIGNS:  Blood pressure 123/80, pulse 84, temperature 98.3 F (36.8 C), temperature source Oral, resp. rate 19, height 5\' 2"  (1.575 m), weight 58.4 kg, SpO2 98 %.   REVIEW OF SYSTEMS:  Review of Systems  Constitutional: Negative.  Negative for chills, fever and malaise/fatigue.  HENT: Negative.  Negative for ear discharge, ear pain, hearing loss, nosebleeds and sore throat.   Eyes: Negative.  Negative for blurred vision and pain.  Respiratory: Negative.  Negative for cough, hemoptysis, shortness of breath and wheezing.   Cardiovascular: Negative.  Negative for chest pain, palpitations and leg swelling.  Gastrointestinal: Negative.  Negative for abdominal pain, blood in stool, diarrhea, nausea and vomiting.  Genitourinary: Negative.  Negative for dysuria.  Musculoskeletal: Negative.  Negative for back pain.  Skin: Negative.   Neurological: Negative for dizziness, tremors, speech change, focal weakness, seizures and headaches.  Endo/Heme/Allergies: Negative.  Does not bruise/bleed easily.  Psychiatric/Behavioral: Negative.  Negative for depression, hallucinations and suicidal ideas.     PHYSICAL EXAMINATION:  GENERAL:  53 y.o.-year-old patient lying in the  bed with no acute distress.  NECK:  Supple, no jugular venous distention. No thyroid enlargement, no tenderness.  LUNGS: Normal breath sounds bilaterally, no wheezing, rales,rhonchi  No use of accessory muscles of respiration.  CARDIOVASCULAR: S1, S2 normal. No murmurs, rubs, or gallops.  ABDOMEN: Soft, non-tender, non-distended. Bowel sounds present. No organomegaly or mass.  EXTREMITIES: No pedal edema, cyanosis, or clubbing.  PSYCHIATRIC: The patient is alert and oriented x 3.  SKIN: No obvious rash, lesion, or ulcer.   DATA REVIEW:   CBC Recent Labs  Lab 05/14/18 0447  WBC 9.5  HGB 13.7  HCT 41.0  PLT 360    Chemistries  Recent Labs  Lab 05/13/18 2241  05/15/18 0456 05/16/18 0257  NA 138   < > 139 139  K 3.5   < > 4.2 4.0  CL 104   < > 112* 110  CO2 27   < > 22  23  GLUCOSE 111*   < > 81 93  BUN 27*   < > 22* 22*  CREATININE 0.94   < > 1.00 1.07*  CALCIUM 9.4   < > 8.7* 8.9  MG  --    < > 2.2  --   AST 17  --   --   --   ALT 16  --   --   --   ALKPHOS 80  --   --   --   BILITOT 0.4  --   --   --    < > = values in this interval not displayed.    Cardiac Enzymes Recent Labs  Lab 05/15/18 1138 05/15/18 1706 05/15/18 2304  TROPONINI 26.24* 25.63* 20.66*    Microbiology Results  @[id number] @  RADIOLOGY:  No results found.    Allergies as of 05/16/2018      Reactions   Ativan [lorazepam]    Anger      Medication List    STOP taking these medications   HYDROcodone-acetaminophen 5-325 MG tablet Commonly known as:  NORCO/VICODIN     TAKE these medications   albuterol 108 (90 Base) MCG/ACT inhaler Commonly known as:  PROAIR HFA Inhale 2 puffs into the lungs every 6 (six) hours as needed for wheezing or shortness of breath.   amLODipine 10 MG tablet Commonly known as:  NORVASC Take 1 tablet (10 mg total) by mouth daily.   aspirin EC 81 MG tablet Take 81 mg by mouth every 6 (six) hours as needed (for pain).   atorvastatin 80 MG  tablet Commonly known as:  LIPITOR Take 1 tablet (80 mg total) by mouth daily at 6 PM. What changed:    medication strength  how much to take   carvedilol 3.125 MG tablet Commonly known as:  COREG Take 1 tablet (3.125 mg total) by mouth 2 (two) times daily with a meal.   clonazePAM 1 MG tablet Commonly known as:  KLONOPIN Take 1 tablet (1 mg total) by mouth 3 (three) times daily as needed for anxiety (anxiety).   cloNIDine 0.1 MG tablet Commonly known as:  CATAPRES Take 1 tablet (0.1 mg total) by mouth 2 (two) times daily. What changed:    medication strength  how much to take   gabapentin 400 MG capsule Commonly known as:  NEURONTIN Take 1 capsule (400 mg total) by mouth 3 (three) times daily. Reported on 07/02/2015   losartan 100 MG tablet Commonly known as:  COZAAR Take 1 tablet (100 mg total) by mouth daily.   nicotine 14 mg/24hr patch Commonly known as:  NICODERM CQ - dosed in mg/24 hours Place 1 patch (14 mg total) onto the skin daily.   nitroGLYCERIN 0.4 MG SL tablet Commonly known as:  NITROSTAT Place 1 tablet (0.4 mg total) under the tongue every 5 (five) minutes as needed for chest pain.   pantoprazole 40 MG tablet Commonly known as:  PROTONIX Take 1 tablet (40 mg total) by mouth 2 (two) times daily before a meal. Reported on 07/02/2015   sucralfate 1 g tablet Commonly known as:  CARAFATE Take 1 tablet (1 g total) by mouth 4 (four) times daily.   ticagrelor 90 MG Tabs tablet Commonly known as:  BRILINTA Take 90 mg by mouth 2 (two) times daily.   tiotropium 18 MCG inhalation capsule Commonly known as:  SPIRIVA HANDIHALER Place 1 capsule (18 mcg total) into inhaler and inhale daily.        Aspirin  prescribed at discharge?  Yes High Intensity Statin Prescribed? (Lipitor 40-80mg  or Crestor 20-40mg ): Yes Beta Blocker Prescribed? Yes Was EF assessed during THIS hospitalization? Yes Was Cardiac Rehab II ordered? (Included Medically managed Patients):  yes   Management plans discussed with the patient and she is in agreement. Stable for discharge home  Patient should follow up with cardiology  CODE STATUS:     Code Status Orders  (From admission, onward)         Start     Ordered   05/14/18 0047  Full code  Continuous     05/14/18 0046        Code Status History    Date Active Date Inactive Code Status Order ID Comments User Context   04/24/2018 2130 04/27/2018 1725 Full Code [id number]  Demetrios Loll, MD Inpatient   04/21/2017 1525 04/21/2017 2007 Full Code [id number]  Yolonda Kida, MD Inpatient   05/31/2016 1733 06/01/2016 1813 Full Code [id number]  Yolonda Kida, MD Inpatient   07/16/2015 1654 07/17/2015 1710 Full Code [id number]  Yolonda Kida, MD Inpatient      TOTAL TIME TAKING CARE OF THIS PATIENT: 39 minutes.    Note: This dictation was prepared with Dragon dictation along with smaller phrase technology. Any transcriptional errors that result from this process are unintentional.  Amond Speranza M.D on 05/16/2018 at 12:59 PM  Between 7am to 6pm - Pager - [phone] After 6pm go to [email or web address] - password [email or web address]  [facility]  Office  [phone]  CC: Primary care physician; Jodi Marble, MD

## 2018-05-16 NOTE — Plan of Care (Signed)

## 2018-05-16 NOTE — Progress Notes (Signed)
Patient given discharge instructions with mother at bedside. Patient very anxious to go home. IV's taken out and tele monitor off. Prescriptions given to patient. Patient verbalized understanding with no further questions or concerns. Emphasized on importance of taking all medications. Patient going home via family vehicle.

## 2018-05-16 NOTE — Telephone Encounter (Signed)
I had contacted pt to schedule EGD since we had gotten her approval from Dr. Clayborn Bigness to hold Brilinta and pt states she had a heart attack on Saturday and was seen at [facility]. Informed pt we will have to wait for EGD until cleared by cardiologist and inform Dr. Bonna Gains, which has been done.

## 2018-05-16 NOTE — Progress Notes (Addendum)
eCardiovascular and Pulmonary Nurse Navigator Note:    53 year old female with hx of polysubstance abuse and previous stenting of the RCA and left circumflex with re-stenosis secondary to noncompliance with medications who presented to ED with chest pain and found to have STEMI of inferior lateral wall.  Patient underwent with emergent Cardiac Catheterization.      Procedures  05/13/2018  Coronary/Graft Acute MI Revascularization  LEFT HEART CATH AND CORONARY ANGIOGRAPHY  Conclusion     Mid RCA lesion is 5% stenosed.  Prox RCA lesion is 30% stenosed.  Dist RCA lesion is 30% stenosed.  Ost Cx to Prox Cx lesion is 50% stenosed.  Ost 1st Mrg lesion is 50% stenosed.  Prox Cx to Mid Cx lesion is 100% stenosed.  Post intervention, there is a 20% residual stenosis.  Balloon angioplasty was performed using a BALLOON TREK RX 2.5X12.  Mid Cx lesion is 30% stenosed.  The left ventricular systolic function is normal.  LV end diastolic pressure is normal.  The left ventricular ejection fraction is 50-55% by visual estimate.   1.  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  Patent stent in the mid RCA. 2.  Low normal LV systolic function with an EF of 50 to 55% with inferolateral hypokinesis.  Normal left ventricular end-diastolic pressure. 3.  Successful balloon angioplasty and aspiration thrombectomy to the left circumflex.  No additional stent was placed given that the area has 2 layers of stents already.  Evidence of embolization in the distal left circumflex for which Aggrastat infusion was started.  Recommendations: Continue dual antiplatelet therapy indefinitely if possible Aggressive treatment of risk factors. Continue Aggrastat for 18 hours. I started small dose carvedilol.   Check an echocardiogram.    EDUCATION:   When this nurse entered the room the nurse tech was replacing all of patient's ECG  electrodes, as patient had removed them thinking she was ready to go home.   Patient appears anxious.  Patient stating, "I am ready to go home."  Patient's mother at bedside.    "Heart Attack Bouncing Back" booklet given and reviewed with patient. Discussed the definition of CAD. Reviewed the location of CAD and where balloon angioplasty to re-instent stenosis was performed.   ? Discussed modifiable risk factors including controlling blood pressure, cholesterol, and blood sugar; following heart healthy diet; maintaining healthy weight; exercise; and smoking cessation.  ? Discussed cardiac medications including rationale for taking, mechanisms of action, and side effects. Stressed the importance of taking medications as prescribed.  ? Discussed emergency plan for heart attack symptoms. Patient verbalized understanding of need to call 911 and not to drive himself to ER if having cardiac symptoms / chest pain.  ? Heart healthy diet of low sodium, low fat, low cholesterol heart healthy diet discussed. Information on diet provided. Patient's response, "Oh yeah, I have followed a heart healthy diet since I've been here.  I've lost eight pounds."   ? Smoking Cessation - Patient is a current every day smoker.  Patient stated, "I am not ready to give up smoking.  They have taken everything else away from me including sex and I'm not going to give up my cigarettes.  I have few pleasures left in life and smoking is one thing I enjoy."   ? Exercise - Benefits of exercised discussed. Informed patient that her cardiologist has referred her to outpatient Cardiac Rehab. An overview of the program was provided.  Patient interrupted me and stated, "I get plenty of exercise at home.  I walk a lot and am constantly busy.  I do not need to come to this program."    Patient stating, "I am just ready to go home to see my baby - Cloyde Reams - my chihuahua."      Patient acknowledges understanding of above information, but does  not appear ready to make any changes for a more heart healthy lifestyle at this time.    ? Roanna Epley, RN, BSN, [place]  [facility] Cardiac & Pulmonary Rehab  Cardiovascular & Pulmonary Nurse Navigator  Direct Line: [phone]  Department Phone #: [phone] Fax: [phone]  Email Address: [email or web address]

## 2018-05-30 ENCOUNTER — Encounter (INDEPENDENT_AMBULATORY_CARE_PROVIDER_SITE_OTHER): Payer: Medicare Other | Admitting: Vascular Surgery

## 2018-06-06 ENCOUNTER — Encounter (INDEPENDENT_AMBULATORY_CARE_PROVIDER_SITE_OTHER): Payer: Self-pay | Admitting: Vascular Surgery

## 2018-06-06 ENCOUNTER — Other Ambulatory Visit: Payer: Self-pay

## 2018-06-06 ENCOUNTER — Ambulatory Visit (INDEPENDENT_AMBULATORY_CARE_PROVIDER_SITE_OTHER): Payer: Medicare Other | Admitting: Vascular Surgery

## 2018-06-06 ENCOUNTER — Encounter (INDEPENDENT_AMBULATORY_CARE_PROVIDER_SITE_OTHER): Payer: Self-pay

## 2018-06-06 VITALS — BP 151/91 | HR 96 | Resp 17 | Ht 64.0 in | Wt 127.4 lb

## 2018-06-06 DIAGNOSIS — R1030 Lower abdominal pain, unspecified: Secondary | ICD-10-CM | POA: Diagnosis not present

## 2018-06-06 DIAGNOSIS — F17210 Nicotine dependence, cigarettes, uncomplicated: Secondary | ICD-10-CM

## 2018-06-06 DIAGNOSIS — E785 Hyperlipidemia, unspecified: Secondary | ICD-10-CM

## 2018-06-06 DIAGNOSIS — I10 Essential (primary) hypertension: Secondary | ICD-10-CM

## 2018-06-06 DIAGNOSIS — I774 Celiac artery compression syndrome: Secondary | ICD-10-CM | POA: Insufficient documentation

## 2018-06-06 DIAGNOSIS — I771 Stricture of artery: Secondary | ICD-10-CM

## 2018-06-06 DIAGNOSIS — Z79899 Other long term (current) drug therapy: Secondary | ICD-10-CM

## 2018-06-06 DIAGNOSIS — F17200 Nicotine dependence, unspecified, uncomplicated: Secondary | ICD-10-CM

## 2018-06-06 NOTE — Assessment & Plan Note (Signed)
lipid control important in reducing the progression of atherosclerotic disease. Continue statin therapy  

## 2018-06-06 NOTE — Patient Instructions (Signed)
Chronic Mesenteric Ischemia  Mesenteric ischemia is poor blood flow (circulation) in the vessels that supply blood to the stomach, intestines, and liver (mesenteric organs). Chronic mesenteric ischemia, also called mesenteric angina or intestinal angina, is a long-term (chronic) condition. It happens when an artery or vein that provides blood to the mesenteric organs gradually becomes blocked or narrow, restricting the blood supply to the organs. When the blood supply is severely restricted, the mesenteric organs cannot work properly. What are the causes? This condition is commonly caused by fatty deposits that build up in an artery (plaque), which can narrow the artery and restrict blood flow. Other causes include:  Weakened areas in blood vessel walls (aneurysms).  Conditions that cause twisting or inflammation of blood vessels, such as fibromuscular dysplasia or arteritis.  A disorder in which blood clots form in the veins (venous thrombosis).  Scarring and thickening (fibrosis) of blood vessels caused by radiation therapy.  A tear in the aorta, the body's main artery (aortic dissection).  Blood vessel problems after illegal drug use, such as use of cocaine.  Tumors in the nervous system (neurofibromatosis).  Certain autoimmune diseases, such as lupus. What increases the risk? The following factors may make you more likely to develop this condition:  Being female.  Being over age 50, especially if you have a history of heart problems.  Smoking.  Congestive heart failure.  Irregular heartbeat (arrhythmia).  Having a history of heart attack or stroke.  Diabetes.  High cholesterol.  High blood pressure (hypertension).  Being overweight or obese.  Kidney disease (renal disease) requiring dialysis. What are the signs or symptoms? Symptoms of this condition include:  Abdomen (abdominal) pain or cramps that develop 15-60 minutes after a meal. This pain may last for 1-3  hours. Some people may develop a fear of eating because of this symptom.  Weight loss.  Diarrhea.  Bloody stool.  Nausea.  Vomiting.  Bloating.  Abdominal pain after stress or with exercise. How is this diagnosed? This condition is diagnosed based on:  Your medical history.  A physical exam.  Tests, such as: ? Ultrasound. ? CT scan. ? Blood tests. ? Urine tests. ? An imaging test that involves injecting a dye into your arteries to show blood flow through blood vessels (angiogram). This can help to show if there are any blockages in the vessels that lead to the intestines. ? Passing a small probe through the mouth and into the stomach to measure the output of carbon dioxide (gastric tonometry). This can help to indicate whether there is decreased blood flow to the stomach and intestines. How is this treated? This condition may be treated with:  Dietary changes such as eating smaller, low-fat, meals more frequently.  Lifestyle changes to treat underlying conditions that contribute to the disease, such as high cholesterol and high blood pressure.  Medicines to reduce blood clotting and increase blood flow.  Surgery to remove the blockage, repair arteries or veins, and restore blood flow. This may involve: ? Angioplasty. This is surgery to widen the affected artery, reduce the blockage, and sometimes insert a small, mesh tube (stent). ? Bypass surgery. This may be done to go around (bypass) the blockage and reconnect healthy arteries or veins. ? Placing a stent in the affected area. This may be done to help keep blocked arteries open. Follow these instructions at home: Eating and drinking  Eat a heart-healthy diet. This includes fresh fruits and vegetables, whole grains, and lean proteins like chicken, fish, eggs,   and beans.  Avoid foods that contain a lot of: ? Salt (sodium). ? Sugar. ? Saturated fat (such as red meat). ? Trans fat (such as fried foods).  Stay  hydrated. Drink enough fluid to keep your urine clear or pale yellow. Lifestyle  Stay active and get regular exercise as told by your health care provider. Aim for 150 minutes of moderate activity or 75 minutes of vigorous activity a week. Ask your health care provider what activities and forms of exercise are safe for you.  Maintain a healthy weight.  Work with your health care provider to manage your cholesterol.  Manage any other health problems you have, such as high blood pressure, diabetes, or heart rhythm problems.  Do not use any products that contain nicotine or tobacco, such as cigarettes and e-cigarettes. If you need help quitting, ask your health care provider. General instructions  Take over-the-counter and prescription medicines only as told by your health care provider.  Keep all follow-up visits as told by your health care provider. This is important. Contact a health care provider if:  Your symptoms do not improve or they return after treatment.  You have a fever. Get help right away if:  You have severe abdominal pain.  You have severe chest pain.  You have shortness of breath.  You feel weak or dizzy.  You have palpitations.  You have numbness or weakness in your face, arm, or leg.  You are confused.  You have trouble speaking or people have trouble understanding what you are saying.  You are constipated.  You have trouble urinating.  You have blood in your stool.  You have severe nausea, vomiting, or persistent diarrhea. Summary  Mesenteric ischemia is poor circulation in the vessels that supply blood to the the stomach, intestines, and liver (mesenteric organs).  This condition happens when an artery or vein that provides blood to the mesenteric organs gradually becomes blocked or narrow, restricting the blood supply to the organs.  This condition is commonly caused by fatty deposits that build up in an artery (plaque), which can narrow the  artery and restrict blood flow.  You are more likely to develop this condition if you are over age 50 and have a history of heart problems, high blood pressure, diabetes, or high cholesterol.  This condition is usually treated with medicines, dietary and lifestyle changes, and surgery to remove the blockage, repair arteries or veins, and restore blood flow. This information is not intended to replace advice given to you by your health care provider. Make sure you discuss any questions you have with your health care provider. Document Released: 11/09/2010 Document Revised: 03/06/2016 Document Reviewed: 03/06/2016 Elsevier Interactive Patient Education  2019 [employer]  

## 2018-06-06 NOTE — Assessment & Plan Note (Signed)
Although I do not think her celiac artery stenosis is the only issue, this could be contributing to her abdominal pain.

## 2018-06-06 NOTE — Assessment & Plan Note (Signed)
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  This may contribute to her postprandial abdominal pain and weight loss as well as food fear.  I discussed with her this will not cure all of her symptoms.  I have discussed the risks and benefits of intervention to the celiac artery which would likely be a stent placement.  She is very desirous to have this performed and we will schedule this for the near future.
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
## 2018-06-06 NOTE — Assessment & Plan Note (Signed)
blood pressure control important in reducing the progression of atherosclerotic disease. On appropriate oral medications.  

## 2018-06-12 ENCOUNTER — Other Ambulatory Visit: Payer: Self-pay

## 2018-06-12 ENCOUNTER — Ambulatory Visit: Payer: Medicare Other | Admitting: Certified Registered Nurse Anesthetist

## 2018-06-12 ENCOUNTER — Encounter
Admission: RE | Disposition: A | Discharge status: Home / Self Care | Payer: Self-pay | Source: Home / Self Care | Attending: Vascular Surgery

## 2018-06-12 ENCOUNTER — Encounter: Payer: Self-pay | Admitting: Anesthesiology

## 2018-06-12 ENCOUNTER — Ambulatory Visit
Admission: RE | Admit: 2018-06-12 | Discharge: 2018-06-12 | Disposition: A | Discharge status: Home / Self Care | Payer: Medicare Other | Attending: Vascular Surgery | Admitting: Vascular Surgery

## 2018-06-12 ENCOUNTER — Other Ambulatory Visit (INDEPENDENT_AMBULATORY_CARE_PROVIDER_SITE_OTHER): Payer: Self-pay | Admitting: Nurse Practitioner

## 2018-06-12 DIAGNOSIS — F419 Anxiety disorder, unspecified: Secondary | ICD-10-CM | POA: Insufficient documentation

## 2018-06-12 DIAGNOSIS — I10 Essential (primary) hypertension: Secondary | ICD-10-CM | POA: Diagnosis not present

## 2018-06-12 DIAGNOSIS — E785 Hyperlipidemia, unspecified: Secondary | ICD-10-CM | POA: Diagnosis not present

## 2018-06-12 DIAGNOSIS — F17210 Nicotine dependence, cigarettes, uncomplicated: Secondary | ICD-10-CM | POA: Diagnosis not present

## 2018-06-12 DIAGNOSIS — Z8589 Personal history of malignant neoplasm of other organs and systems: Secondary | ICD-10-CM | POA: Diagnosis not present

## 2018-06-12 DIAGNOSIS — F319 Bipolar disorder, unspecified: Secondary | ICD-10-CM | POA: Diagnosis not present

## 2018-06-12 DIAGNOSIS — K551 Chronic vascular disorders of intestine: Secondary | ICD-10-CM | POA: Diagnosis not present

## 2018-06-12 DIAGNOSIS — J449 Chronic obstructive pulmonary disease, unspecified: Secondary | ICD-10-CM | POA: Diagnosis not present

## 2018-06-12 DIAGNOSIS — Z79899 Other long term (current) drug therapy: Secondary | ICD-10-CM | POA: Insufficient documentation

## 2018-06-12 DIAGNOSIS — Z7982 Long term (current) use of aspirin: Secondary | ICD-10-CM | POA: Diagnosis not present

## 2018-06-12 DIAGNOSIS — I252 Old myocardial infarction: Secondary | ICD-10-CM | POA: Insufficient documentation

## 2018-06-12 DIAGNOSIS — Z9221 Personal history of antineoplastic chemotherapy: Secondary | ICD-10-CM | POA: Diagnosis not present

## 2018-06-12 DIAGNOSIS — I774 Celiac artery compression syndrome: Secondary | ICD-10-CM | POA: Diagnosis present

## 2018-06-12 DIAGNOSIS — R109 Unspecified abdominal pain: Secondary | ICD-10-CM | POA: Diagnosis not present

## 2018-06-12 DIAGNOSIS — I708 Atherosclerosis of other arteries: Secondary | ICD-10-CM

## 2018-06-12 DIAGNOSIS — I771 Stricture of artery: Secondary | ICD-10-CM

## 2018-06-12 DIAGNOSIS — K219 Gastro-esophageal reflux disease without esophagitis: Secondary | ICD-10-CM | POA: Insufficient documentation

## 2018-06-12 DIAGNOSIS — Z923 Personal history of irradiation: Secondary | ICD-10-CM | POA: Diagnosis not present

## 2018-06-12 HISTORY — PX: VISCERAL ANGIOGRAPHY: CATH118276

## 2018-06-12 LAB — CREATININE, SERUM
CREATININE: 1.06 mg/dL — AB (ref 0.44–1.00)
GFR calc Af Amer: >60 mL/min (ref >60)
GFR calc non Af Amer: >60 mL/min (ref >60)

## 2018-06-12 LAB — BUN: BUN: 24 mg/dL — ABNORMAL HIGH (ref 6–20)

## 2018-06-12 SURGERY — VISCERAL ANGIOGRAPHY
Anesthesia: General

## 2018-06-12 MED ORDER — LIDOCAINE-EPINEPHRINE (PF) 1 %-1:200000 IJ SOLN
INTRAMUSCULAR | Status: AC
  Filled 2018-06-12: qty 30

## 2018-06-12 MED ORDER — ROCURONIUM BROMIDE 100 MG/10ML IV SOLN
INTRAVENOUS | Status: DC | PRN
  Administered 2018-06-12: 10 mg via INTRAVENOUS
  Administered 2018-06-12: 20 mg via INTRAVENOUS

## 2018-06-12 MED ORDER — ONDANSETRON HCL 4 MG/2ML IJ SOLN: 4.0000 mg | Freq: Four times a day (QID) | INTRAMUSCULAR | Status: DC | PRN

## 2018-06-12 MED ORDER — FENTANYL CITRATE (PF) 100 MCG/2ML IJ SOLN: 25.0000 ug | INTRAMUSCULAR | Status: DC | PRN

## 2018-06-12 MED ORDER — HYDROMORPHONE HCL 1 MG/ML IJ SOLN: 1.0000 mg | Freq: Once | INTRAMUSCULAR | Status: DC | PRN

## 2018-06-12 MED ORDER — DEXAMETHASONE SODIUM PHOSPHATE 10 MG/ML IJ SOLN
INTRAMUSCULAR | Status: DC | PRN
  Administered 2018-06-12: 4 mg via INTRAVENOUS

## 2018-06-12 MED ORDER — METHYLPREDNISOLONE SODIUM SUCC 125 MG IJ SOLR: 125.0000 mg | Freq: Once | INTRAMUSCULAR | Status: DC | PRN

## 2018-06-12 MED ORDER — FENTANYL CITRATE (PF) 100 MCG/2ML IJ SOLN
INTRAMUSCULAR | Status: AC
  Filled 2018-06-12: qty 2

## 2018-06-12 MED ORDER — PHENYLEPHRINE HCL 10 MG/ML IJ SOLN
INTRAMUSCULAR | Status: DC | PRN
  Administered 2018-06-12 (×2): 200 ug via INTRAVENOUS

## 2018-06-12 MED ORDER — ROCURONIUM BROMIDE 50 MG/5ML IV SOLN
INTRAVENOUS | Status: AC
  Filled 2018-06-12: qty 1

## 2018-06-12 MED ORDER — MIDAZOLAM HCL 2 MG/2ML IJ SOLN
INTRAMUSCULAR | Status: AC
  Administered 2018-06-12: 1 mg
  Filled 2018-06-12: qty 2

## 2018-06-12 MED ORDER — CEFAZOLIN SODIUM-DEXTROSE 2-4 GM/100ML-% IV SOLN
2.0000 g | Freq: Once | INTRAVENOUS | Status: AC
  Administered 2018-06-12: 2 g via INTRAVENOUS

## 2018-06-12 MED ORDER — MIDAZOLAM HCL 2 MG/ML PO SYRP: 8.0000 mg | ORAL_SOLUTION | Freq: Once | ORAL | Status: DC | PRN

## 2018-06-12 MED ORDER — FAMOTIDINE 20 MG PO TABS: 40.0000 mg | ORAL_TABLET | Freq: Once | ORAL | Status: DC | PRN

## 2018-06-12 MED ORDER — MIDAZOLAM HCL 2 MG/2ML IJ SOLN: 1.0000 mg | Freq: Once | INTRAMUSCULAR | Status: DC

## 2018-06-12 MED ORDER — ONDANSETRON HCL 4 MG/2ML IJ SOLN
INTRAMUSCULAR | Status: DC | PRN
  Administered 2018-06-12: 4 mg via INTRAVENOUS

## 2018-06-12 MED ORDER — DIPHENHYDRAMINE HCL 50 MG/ML IJ SOLN: 50.0000 mg | Freq: Once | INTRAMUSCULAR | Status: DC | PRN

## 2018-06-12 MED ORDER — SUCCINYLCHOLINE CHLORIDE 20 MG/ML IJ SOLN
INTRAMUSCULAR | Status: DC | PRN
  Administered 2018-06-12: 80 mg via INTRAVENOUS

## 2018-06-12 MED ORDER — SUGAMMADEX SODIUM 200 MG/2ML IV SOLN
INTRAVENOUS | Status: DC | PRN
  Administered 2018-06-12: 150 mg via INTRAVENOUS

## 2018-06-12 MED ORDER — IPRATROPIUM-ALBUTEROL 0.5-2.5 (3) MG/3ML IN SOLN
3.0000 mL | Freq: Four times a day (QID) | RESPIRATORY_TRACT | Status: DC
  Administered 2018-06-12: 3 mL via RESPIRATORY_TRACT

## 2018-06-12 MED ORDER — FENTANYL CITRATE (PF) 100 MCG/2ML IJ SOLN
INTRAMUSCULAR | Status: DC | PRN
  Administered 2018-06-12 (×2): 25 ug via INTRAVENOUS
  Administered 2018-06-12 (×3): 50 ug via INTRAVENOUS

## 2018-06-12 MED ORDER — HEPARIN (PORCINE) IN NACL 1000-0.9 UT/500ML-% IV SOLN
INTRAVENOUS | Status: AC
  Filled 2018-06-12: qty 1000

## 2018-06-12 MED ORDER — IOPAMIDOL (ISOVUE-300) INJECTION 61%
INTRAVENOUS | Status: DC | PRN
  Administered 2018-06-12: 95 mL via INTRAVENOUS

## 2018-06-12 MED ORDER — MIDAZOLAM HCL 2 MG/2ML IJ SOLN
INTRAMUSCULAR | Status: DC | PRN
  Administered 2018-06-12: 2 mg via INTRAVENOUS

## 2018-06-12 MED ORDER — SODIUM CHLORIDE 0.9 % IV SOLN
INTRAVENOUS | Status: DC
  Administered 2018-06-12: 1000 mL via INTRAVENOUS

## 2018-06-12 MED ORDER — HEPARIN SODIUM (PORCINE) 1000 UNIT/ML IJ SOLN
INTRAMUSCULAR | Status: DC | PRN
  Administered 2018-06-12: 4000 [IU] via INTRAVENOUS

## 2018-06-12 MED ORDER — LABETALOL HCL 5 MG/ML IV SOLN
INTRAVENOUS | Status: DC | PRN
  Administered 2018-06-12: 10 mg via INTRAVENOUS

## 2018-06-12 MED ORDER — CEFAZOLIN SODIUM-DEXTROSE 2-4 GM/100ML-% IV SOLN
INTRAVENOUS | Status: AC
  Filled 2018-06-12: qty 100

## 2018-06-12 MED ORDER — LIDOCAINE HCL (CARDIAC) PF 100 MG/5ML IV SOSY
PREFILLED_SYRINGE | INTRAVENOUS | Status: DC | PRN
  Administered 2018-06-12: 100 mg via INTRAVENOUS

## 2018-06-12 MED ORDER — PROPOFOL 10 MG/ML IV BOLUS
INTRAVENOUS | Status: DC | PRN
  Administered 2018-06-12: 150 mg via INTRAVENOUS

## 2018-06-12 MED ORDER — IPRATROPIUM-ALBUTEROL 0.5-2.5 (3) MG/3ML IN SOLN
RESPIRATORY_TRACT | Status: AC
  Filled 2018-06-12: qty 3

## 2018-06-12 MED ORDER — ONDANSETRON HCL 4 MG/2ML IJ SOLN: 4.0000 mg | Freq: Once | INTRAMUSCULAR | Status: DC | PRN

## 2018-06-12 MED ORDER — PROPOFOL 10 MG/ML IV BOLUS
INTRAVENOUS | Status: AC
  Filled 2018-06-12: qty 20

## 2018-06-12 MED ORDER — MIDAZOLAM HCL 2 MG/2ML IJ SOLN
INTRAMUSCULAR | Status: AC
  Filled 2018-06-12: qty 2

## 2018-06-12 SURGICAL SUPPLY — 15 items
CATH BEACON 5 .035 65 C2 TIP (CATHETERS) ×3
CATH IMAGER II S 5FR 65CM (CATHETERS) ×3
CATH PIG 70CM (CATHETERS) ×3
CATH VS15FR (CATHETERS) ×6
COVER PROBE U/S 5X48 (MISCELLANEOUS) ×3
DEVICE STARCLOSE SE CLOSURE (Vascular Products) ×3 IMPLANT
DEVICE TORQUE (MISCELLANEOUS) ×3
GLIDEWIRE ADV .035X180CM (WIRE) ×3
GLIDEWIRE STIFF .35X180X3 HYDR (WIRE) ×3
MICROCATH PROGREAT 2.8F 110 CM (CATHETERS) ×3
PACK ANGIOGRAPHY (CUSTOM PROCEDURE TRAY) ×3
SHEATH BRITE TIP 5FRX11 (SHEATH) ×3
SYR MEDRAD MARK 7 150ML (SYRINGE) ×3
TUBING CONTRAST HIGH PRESS 72 (TUBING) ×3
WIRE J 3MM .035X145CM (WIRE) ×3

## 2018-06-12 NOTE — H&P (Signed)
[facility] History & Physical Update  The patient was interviewed and re-examined.  The patient's previous History and Physical has been reviewed and is unchanged.  There is no change in the plan of care. We plan to proceed with the scheduled procedure.  Leotis Pain, MD  06/12/2018, 11:21 AM

## 2018-06-12 NOTE — Anesthesia Post-op Follow-up Note (Signed)
Anesthesia QCDR form completed.        

## 2018-06-12 NOTE — Anesthesia Procedure Notes (Signed)
Procedure Name: Intubation Date/Time: 06/12/2018 1:47 PM Performed by: Johnna Acosta, CRNA Pre-anesthesia Checklist: Patient identified, Emergency Drugs available, Patient being monitored, Timeout performed and Suction available Patient Re-evaluated:Patient Re-evaluated prior to induction Oxygen Delivery Method: Circle system utilized Preoxygenation: Pre-oxygenation with 100% oxygen Induction Type: IV induction Ventilation: Mask ventilation without difficulty and Oral airway inserted - appropriate to patient size Laryngoscope Size: Sabra Heck and 2 Grade View: Grade I Tube type: Oral Tube size: 7.0 mm Number of attempts: 1 Airway Equipment and Method: Stylet and Oral airway Placement Confirmation: ETT inserted through vocal cords under direct vision,  positive ETCO2 and breath sounds checked- equal and bilateral Secured at: 21 cm Tube secured with: Tape Dental Injury: Teeth and Oropharynx as per pre-operative assessment

## 2018-06-12 NOTE — Anesthesia Postprocedure Evaluation (Signed)
Anesthesia Post Note  Patient: Carol Henderson  Procedure(s) Performed: VISCERAL ANGIOGRAPHY (N/A )  Patient location during evaluation: PACU Anesthesia Type: General Level of consciousness: awake and alert Pain management: pain level controlled Vital Signs Assessment: post-procedure vital signs reviewed and stable Respiratory status: spontaneous breathing and respiratory function stable Cardiovascular status: stable Anesthetic complications: no     Last Vitals:  Vitals:   06/12/18 1115 06/12/18 1530  BP: 124/65 (!) 145/83  Pulse: 75 (!) 52  Resp: 16 18  Temp: 36.7 C (!) 36.3 C  SpO2: 100% 100%    Last Pain:  Vitals:   06/12/18 1530  TempSrc: Temporal  PainSc: (P) 7                  Nijah Tejera K

## 2018-06-12 NOTE — Transfer of Care (Signed)
Immediate Anesthesia Transfer of Care Note  Patient: NEEYA PRIGMORE  Procedure(s) Performed: VISCERAL ANGIOGRAPHY (N/A )  Patient Location: PACU  Anesthesia Type:General  Level of Consciousness: awake, alert  and oriented  Airway & Oxygen Therapy: Patient Spontanous Breathing and Patient connected to face mask oxygen  Post-op Assessment: Report given to RN and Post -op Vital signs reviewed and stable  Post vital signs: Reviewed and stable  Last Vitals:  Vitals Value Taken Time  BP 145/83 06/12/2018  3:39 PM  Temp 36.3 C 06/12/2018  3:30 PM  Pulse 37 06/12/2018  3:40 PM  Resp 16 06/12/2018  3:40 PM  SpO2 100 % 06/12/2018  3:40 PM  Vitals shown include unvalidated device data.  Last Pain:  Vitals:   06/12/18 1530  TempSrc: Temporal  PainSc:          Complications: No apparent anesthesia complications

## 2018-06-12 NOTE — Op Note (Signed)
[facility] Percutaneous Study/Intervention Procedural Note   Date: 06/12/2018  Surgeon(s): Leotis Pain, MD  Assistants: none  Pre-operative Diagnosis: 1.  Chronic mesenteric ischemia 2.  Celiac stenosis   Post-operative diagnosis: Same but with celiac occlusion  Procedure(s) Performed: 1. Ultrasound guidance for vascular access right femoral artery  2. Catheter placement into SMA and celiac artery from left femoral approach 3. Aortogram and selective angiogram of the the celiac and superior mesenteric arteries 4.StarClose closure device left femoral artery  Contrast: 95 cc  Fluoro time: 25 minutes  EBL: 5 cc   Anesthesia: General  Indications: Patient is a 53 y.o. female who has symptoms consistent with mesenteric ischemia. The patient has a CT scan showing stenosis/occlusion of the celiac artery. The patient is brought in for angiography for further evaluation and potential treatment. Risks and benefits are discussed and informed consent is obtained  Procedure: The patient was identified and appropriate procedural time out was performed. The patient was then placed supine on the table and prepped and draped in the usual sterile fashion.  Anesthesia provided a general anesthetic.  Ultrasound was used to evaluate the left common femoral artery. It was patent . A digital ultrasound image was acquired. A Seldinger needle was used to access the left common femoral artery under direct ultrasound guidance and a permanent image was performed. A 0.035 J wire was advanced without resistance and a 5Fr sheath was placed. Pigtail catheter was placed into the aorta and an AP aortogram was performed. This demonstrated what appeared to be normal aorta and iliac arteries at least in the proximal iliac arteries.  The renal arteries appeared to have normal flow.  The origins of the mesenteric vessels were  not well seen. We transitioned to the lateral projection to image the celiac and SMA. The lateral image demonstrated occlusion of the celiac artery with what appeared to be no obvious stenosis of the superior mesenteric artery. The patient was given 4000 units of IV heparin.  A V S1 catheter was used to selectively cannulate the superior mesenteric artery. This demonstrated no obvious stenosis in the superior mesenteric artery with brisk collateral filling into the celiac trunk.  I then used a V S1 catheter to attempt to selectively cannulate the celiac artery.  Each time the celiac artery was catheterized, selective imaging showed no flow through the celiac system and the catheter pushed out.  Despite multiple attempts with advantage wires, glide wires, and a pro-grate microcatheter as well as a V S1 and a C2 catheter I could never cross the celiac artery occlusion.  I attempted to cannulate the SMA and then go retrograde through the collaterals but was never able to get into the celiac trunk.  Complicating the situation was extremely poor image quality throughout the duration of the procedure.  After we had used over 20 minutes of fluoroscopy and it was clear we were not going to cross the celiac artery occlusion today, I felt that it was in our best interest to finish the procedure. At this point, I elected to terminate the procedure. The diagnostic catheter was removed. StarClose closure device was deployed in usual fashion with excellent hemostatic result. The patient was taken to the recovery room in stable condition having tolerated the procedure well.     Findings:Occlusion of the celiac artery.  Widely patent SMA with good collateral flow into the celiac trunk  Disposition: Patient was taken to the recovery room in stable condition having tolerated the procedure well.  Complications: None  Leotis Pain 06/12/2018 3:46 PM   This note was created with Dragon Medical transcription system. Any  errors in dictation are purely unintentional.

## 2018-06-12 NOTE — Progress Notes (Signed)
Patient arrived from radiology post procedure fully awake and alert oriented x 4 with a lower back pain score of 7. Patient given 50 mcg of fentanyl by the CRNA. Pain reduced to a score of 2. No medications given in PACU. Patient was instructed that she will keep her left leg straight and flat for 2 hours. Patient confirmed understanding of instructions.

## 2018-06-12 NOTE — Anesthesia Preprocedure Evaluation (Addendum)
Anesthesia Evaluation  Patient identified by MRN, date of birth, ID band Patient awake    Reviewed: Allergy & Precautions, NPO status , Patient's Chart, lab work & pertinent test results, reviewed documented beta blocker date and time   Airway Mallampati: II  TM Distance: >3 FB     Dental  (+) Upper Dentures, Lower Dentures   Pulmonary asthma , COPD, Current Smoker,           Cardiovascular hypertension, Pt. on medications and Pt. on home beta blockers + angina + CAD, + Past MI and + Cardiac Stents       Neuro/Psych PSYCHIATRIC DISORDERS Anxiety Bipolar Disorder Schizophrenia  Neuromuscular disease    GI/Hepatic PUD, GERD  Controlled,(+) Hepatitis -  Endo/Other    Renal/GU      Musculoskeletal   Abdominal   Peds  Hematology   Anesthesia Other Findings EKG show BBB. Smokes. Drug use. EF 50-55.  Appreciate cardiology consult. Stomach pain continues.   Reproductive/Obstetrics                            Anesthesia Physical Anesthesia Plan  ASA: III  Anesthesia Plan: General   Post-op Pain Management:    Induction: Intravenous  PONV Risk Score and Plan:   Airway Management Planned: Oral ETT  Additional Equipment:   Intra-op Plan:   Post-operative Plan:   Informed Consent: I have reviewed the patients History and Physical, chart, labs and discussed the procedure including the risks, benefits and alternatives for the proposed anesthesia with the patient or authorized representative who has indicated his/her understanding and acceptance.       Plan Discussed with: CRNA  Anesthesia Plan Comments:         Anesthesia Quick Evaluation

## 2018-06-13 ENCOUNTER — Encounter: Payer: Self-pay | Admitting: Vascular Surgery

## 2018-06-16 ENCOUNTER — Telehealth (INDEPENDENT_AMBULATORY_CARE_PROVIDER_SITE_OTHER): Payer: Self-pay

## 2018-06-16 NOTE — Telephone Encounter (Signed)
Patient had a visceral angio procedure with Dr. Lucky Cowboy on 06/12/2018. Patient now states that her stomach hurts so bad that she gets sweaty and light headed. Patient states she is unable to eat and the pain feels like " someone is reaching through her back and pulling her stomach out".

## 2018-06-16 NOTE — Telephone Encounter (Signed)
After reviewing the operative note and speaking with Dr. Lucky Cowboy, he wasn't able to open her celiac artery so that wouldn't be the cause of her significant discomfort.  She should try tylenol and ibuprofen for the pain, also sometimes heat can be useful as well.

## 2018-06-16 NOTE — Telephone Encounter (Signed)
Spoke with the patient and gave her the recommendation from Dr. Lucky Cowboy and Eulogio Ditch NP. Patient was also advised that if her symptoms became worse to seek the evaluation at the ED. See notes below.

## 2018-06-27 ENCOUNTER — Encounter (INDEPENDENT_AMBULATORY_CARE_PROVIDER_SITE_OTHER): Payer: Self-pay

## 2018-06-27 ENCOUNTER — Ambulatory Visit (INDEPENDENT_AMBULATORY_CARE_PROVIDER_SITE_OTHER): Payer: Medicare Other | Admitting: Vascular Surgery

## 2018-06-27 ENCOUNTER — Telehealth (INDEPENDENT_AMBULATORY_CARE_PROVIDER_SITE_OTHER): Payer: Self-pay | Admitting: Vascular Surgery

## 2018-06-27 NOTE — Telephone Encounter (Signed)
Called patient on the phone to have her follow-up instead of coming in in light of COVID 19.  Still complains of abdominal pain pretty much all the time which may or may not be related to perfusion issues.  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  Unable to treat this endovascularly.  Would not recommend surgical bypass for an isolated celiac artery occlusion as the risk likely outweighs the benefit.  Will defer to primary care physician and may need further gastroenterology evaluation as I am not sure this is the cause of her pain.  It may be a contributing factor.  We can see her back in 3 to 4 months in follow-up.

## 2018-06-27 NOTE — Progress Notes (Deleted)
Phone conversation today with the patient to limit visits with COVID 19.  Underwent mesenteric angiogram about 2 to 3 weeks ago.  Has an occluded celiac artery with a widely patent SMA.  Unable to perform endovascular revascularization of the chronic occlusion which was well collateralized.  Would not recommend open surgical therapy as the benefit likely is not as high as the risk of surgery.  Will defer further work-up to primary care physician or gastroenterology.  Can return in 3 to 4 months in follow-up.  Continue current medical regimen.

## 2018-07-06 ENCOUNTER — Ambulatory Visit: Payer: Medicare Other | Admitting: Gastroenterology

## 2018-09-19 ENCOUNTER — Telehealth (INDEPENDENT_AMBULATORY_CARE_PROVIDER_SITE_OTHER): Payer: Self-pay | Admitting: Vascular Surgery

## 2018-09-19 NOTE — Telephone Encounter (Signed)
Patient has called a few times with complaints of abdominal pain. The last time patient spoke with Dew he suggested that she contact PCP and possibly get referral to gastro from PCP.  I spoke with Dew today and asked what he wanted to do and he said again that patient need to contact pcp for abdominal pain bc from a vascular standpoint she would need to be treated for abdominal pain by pcp.  I advised patient to contact PCP. Patient verbalized understanding. AS, CMA

## 2018-09-22 ENCOUNTER — Other Ambulatory Visit: Payer: Self-pay

## 2018-09-22 DIAGNOSIS — Z6822 Body mass index (BMI) 22.0-22.9, adult: Secondary | ICD-10-CM

## 2018-09-22 DIAGNOSIS — E785 Hyperlipidemia, unspecified: Secondary | ICD-10-CM | POA: Diagnosis present

## 2018-09-22 DIAGNOSIS — F17210 Nicotine dependence, cigarettes, uncomplicated: Secondary | ICD-10-CM | POA: Diagnosis present

## 2018-09-22 DIAGNOSIS — K921 Melena: Secondary | ICD-10-CM | POA: Diagnosis present

## 2018-09-22 DIAGNOSIS — Z8249 Family history of ischemic heart disease and other diseases of the circulatory system: Secondary | ICD-10-CM

## 2018-09-22 DIAGNOSIS — I252 Old myocardial infarction: Secondary | ICD-10-CM

## 2018-09-22 DIAGNOSIS — F1210 Cannabis abuse, uncomplicated: Secondary | ICD-10-CM | POA: Diagnosis present

## 2018-09-22 DIAGNOSIS — F1410 Cocaine abuse, uncomplicated: Secondary | ICD-10-CM | POA: Diagnosis present

## 2018-09-22 DIAGNOSIS — R634 Abnormal weight loss: Secondary | ICD-10-CM | POA: Diagnosis present

## 2018-09-22 DIAGNOSIS — K219 Gastro-esophageal reflux disease without esophagitis: Secondary | ICD-10-CM | POA: Diagnosis present

## 2018-09-22 DIAGNOSIS — F419 Anxiety disorder, unspecified: Secondary | ICD-10-CM | POA: Diagnosis present

## 2018-09-22 DIAGNOSIS — I774 Celiac artery compression syndrome: Secondary | ICD-10-CM | POA: Diagnosis not present

## 2018-09-22 DIAGNOSIS — R1010 Upper abdominal pain, unspecified: Secondary | ICD-10-CM | POA: Diagnosis present

## 2018-09-22 DIAGNOSIS — Z5329 Procedure and treatment not carried out because of patient's decision for other reasons: Secondary | ICD-10-CM | POA: Diagnosis not present

## 2018-09-22 DIAGNOSIS — Z803 Family history of malignant neoplasm of breast: Secondary | ICD-10-CM

## 2018-09-22 DIAGNOSIS — F319 Bipolar disorder, unspecified: Secondary | ICD-10-CM | POA: Diagnosis present

## 2018-09-22 DIAGNOSIS — Z1159 Encounter for screening for other viral diseases: Secondary | ICD-10-CM

## 2018-09-22 DIAGNOSIS — I10 Essential (primary) hypertension: Secondary | ICD-10-CM | POA: Diagnosis present

## 2018-09-22 DIAGNOSIS — J449 Chronic obstructive pulmonary disease, unspecified: Secondary | ICD-10-CM | POA: Diagnosis present

## 2018-09-22 LAB — CBC
HCT: 41.3 % (ref 36.0–46.0)
Hemoglobin: 13.8 g/dL (ref 12.0–15.0)
MCH: 30.6 pg (ref 26.0–34.0)
MCHC: 33.4 g/dL (ref 30.0–36.0)
MCV: 91.6 fL (ref 80.0–100.0)
Platelets: 322 10*3/uL (ref 150–400)
RBC: 4.51 MIL/uL (ref 3.87–5.11)
RDW: 13.2 % (ref 11.5–15.5)
WBC: 7.4 10*3/uL (ref 4.0–10.5)
nRBC: 0 % (ref 0.0–0.2)

## 2018-09-22 LAB — URINALYSIS, COMPLETE (UACMP) WITH MICROSCOPIC
Bacteria, UA: NONE SEEN
Bilirubin Urine: NEGATIVE
Glucose, UA: NEGATIVE mg/dL
Ketones, ur: 5 mg/dL — AB
Nitrite: NEGATIVE
Protein, ur: NEGATIVE mg/dL
Specific Gravity, Urine: 1.028 (ref 1.005–1.030)
pH: 5 (ref 5.0–8.0)

## 2018-09-22 LAB — COMPREHENSIVE METABOLIC PANEL
ALT: 24 U/L (ref 0–44)
AST: 23 U/L (ref 15–41)
Albumin: 3.6 g/dL (ref 3.5–5.0)
Alkaline Phosphatase: 94 U/L (ref 38–126)
Anion gap: 11 (ref 5–15)
BUN: 31 mg/dL — ABNORMAL HIGH (ref 6–20)
CO2: 23 mmol/L (ref 22–32)
Calcium: 8.7 mg/dL — ABNORMAL LOW (ref 8.9–10.3)
Chloride: 106 mmol/L (ref 98–111)
Creatinine, Ser: 1.29 mg/dL — ABNORMAL HIGH (ref 0.44–1.00)
GFR calc Af Amer: 55 mL/min — ABNORMAL LOW (ref >60)
GFR calc non Af Amer: 48 mL/min — ABNORMAL LOW (ref >60)
Glucose, Bld: 115 mg/dL — ABNORMAL HIGH (ref 70–99)
Potassium: 3.6 mmol/L (ref 3.5–5.1)
Sodium: 140 mmol/L (ref 135–145)
Total Bilirubin: 0.3 mg/dL (ref 0.3–1.2)
Total Protein: 7.2 g/dL (ref 6.5–8.1)

## 2018-09-22 LAB — LIPASE, BLOOD: Lipase: 44 U/L (ref 11–51)

## 2018-09-22 NOTE — ED Triage Notes (Signed)
Patient reports abdominal pain with nausea and vomiting.  Reports 12 lb weight loss in 2 weeks.

## 2018-09-23 ENCOUNTER — Inpatient Hospital Stay
Admission: EM | Admit: 2018-09-23 | Discharge: 2018-09-23 | DRG: 392 | Discharge status: Against Medical Advice | Payer: Medicare Other | Attending: Nurse Practitioner | Admitting: Nurse Practitioner

## 2018-09-23 ENCOUNTER — Emergency Department: Payer: Medicare Other

## 2018-09-23 DIAGNOSIS — Z1159 Encounter for screening for other viral diseases: Secondary | ICD-10-CM | POA: Diagnosis not present

## 2018-09-23 DIAGNOSIS — K922 Gastrointestinal hemorrhage, unspecified: Secondary | ICD-10-CM | POA: Diagnosis present

## 2018-09-23 DIAGNOSIS — F419 Anxiety disorder, unspecified: Secondary | ICD-10-CM | POA: Diagnosis present

## 2018-09-23 DIAGNOSIS — E785 Hyperlipidemia, unspecified: Secondary | ICD-10-CM | POA: Diagnosis present

## 2018-09-23 DIAGNOSIS — K219 Gastro-esophageal reflux disease without esophagitis: Secondary | ICD-10-CM | POA: Diagnosis present

## 2018-09-23 DIAGNOSIS — I771 Stricture of artery: Secondary | ICD-10-CM

## 2018-09-23 DIAGNOSIS — I774 Celiac artery compression syndrome: Secondary | ICD-10-CM | POA: Diagnosis present

## 2018-09-23 DIAGNOSIS — Z6822 Body mass index (BMI) 22.0-22.9, adult: Secondary | ICD-10-CM | POA: Diagnosis not present

## 2018-09-23 DIAGNOSIS — J449 Chronic obstructive pulmonary disease, unspecified: Secondary | ICD-10-CM | POA: Diagnosis present

## 2018-09-23 DIAGNOSIS — F17210 Nicotine dependence, cigarettes, uncomplicated: Secondary | ICD-10-CM | POA: Diagnosis present

## 2018-09-23 DIAGNOSIS — K921 Melena: Secondary | ICD-10-CM | POA: Diagnosis present

## 2018-09-23 DIAGNOSIS — I10 Essential (primary) hypertension: Secondary | ICD-10-CM | POA: Diagnosis present

## 2018-09-23 DIAGNOSIS — R1010 Upper abdominal pain, unspecified: Secondary | ICD-10-CM | POA: Diagnosis present

## 2018-09-23 DIAGNOSIS — R109 Unspecified abdominal pain: Secondary | ICD-10-CM

## 2018-09-23 DIAGNOSIS — Z8249 Family history of ischemic heart disease and other diseases of the circulatory system: Secondary | ICD-10-CM | POA: Diagnosis not present

## 2018-09-23 DIAGNOSIS — Z803 Family history of malignant neoplasm of breast: Secondary | ICD-10-CM | POA: Diagnosis not present

## 2018-09-23 DIAGNOSIS — F1410 Cocaine abuse, uncomplicated: Secondary | ICD-10-CM | POA: Diagnosis present

## 2018-09-23 DIAGNOSIS — Z5329 Procedure and treatment not carried out because of patient's decision for other reasons: Secondary | ICD-10-CM | POA: Diagnosis not present

## 2018-09-23 DIAGNOSIS — I252 Old myocardial infarction: Secondary | ICD-10-CM | POA: Diagnosis not present

## 2018-09-23 DIAGNOSIS — R634 Abnormal weight loss: Secondary | ICD-10-CM | POA: Diagnosis present

## 2018-09-23 DIAGNOSIS — F1210 Cannabis abuse, uncomplicated: Secondary | ICD-10-CM | POA: Diagnosis present

## 2018-09-23 DIAGNOSIS — F319 Bipolar disorder, unspecified: Secondary | ICD-10-CM | POA: Diagnosis present

## 2018-09-23 LAB — URINE DRUG SCREEN, QUALITATIVE (ARMC ONLY)
Amphetamines, Ur Screen: NOT DETECTED
Barbiturates, Ur Screen: NOT DETECTED
Benzodiazepine, Ur Scrn: NOT DETECTED
Cannabinoid 50 Ng, Ur ~~LOC~~: POSITIVE — AB
Cocaine Metabolite,Ur ~~LOC~~: POSITIVE — AB
MDMA (Ecstasy)Ur Screen: NOT DETECTED
Methadone Scn, Ur: NOT DETECTED
Opiate, Ur Screen: NOT DETECTED
Phencyclidine (PCP) Ur S: NOT DETECTED
Tricyclic, Ur Screen: NOT DETECTED

## 2018-09-23 MED ORDER — AMLODIPINE BESYLATE 5 MG PO TABS: 10.0000 mg | ORAL_TABLET | Freq: Every day | ORAL | Status: DC

## 2018-09-23 MED ORDER — LOSARTAN POTASSIUM 50 MG PO TABS: 100.0000 mg | ORAL_TABLET | Freq: Every day | ORAL | Status: DC

## 2018-09-23 MED ORDER — GABAPENTIN 400 MG PO CAPS: 400.0000 mg | ORAL_CAPSULE | Freq: Three times a day (TID) | ORAL | Status: DC

## 2018-09-23 MED ORDER — MORPHINE SULFATE (PF) 4 MG/ML IV SOLN
4.0000 mg | Freq: Once | INTRAVENOUS | Status: AC
  Administered 2018-09-23: 4 mg via INTRAVENOUS
  Filled 2018-09-23: qty 1

## 2018-09-23 MED ORDER — ONDANSETRON HCL 4 MG/2ML IJ SOLN
4.0000 mg | Freq: Once | INTRAMUSCULAR | Status: AC
  Administered 2018-09-23: 4 mg via INTRAVENOUS
  Filled 2018-09-23: qty 2

## 2018-09-23 MED ORDER — CLONIDINE HCL 0.1 MG PO TABS: 0.1000 mg | ORAL_TABLET | Freq: Two times a day (BID) | ORAL | Status: DC

## 2018-09-23 MED ORDER — ATORVASTATIN CALCIUM 20 MG PO TABS: 80.0000 mg | ORAL_TABLET | Freq: Every day | ORAL | Status: DC

## 2018-09-23 MED ORDER — MORPHINE SULFATE (PF) 2 MG/ML IV SOLN
2.0000 mg | Freq: Once | INTRAVENOUS | Status: AC
  Administered 2018-09-23: 2 mg via INTRAVENOUS
  Filled 2018-09-23: qty 1

## 2018-09-23 MED ORDER — OXYCODONE HCL 5 MG PO TABS: 5.0000 mg | ORAL_TABLET | ORAL | Status: DC | PRN

## 2018-09-23 MED ORDER — CARVEDILOL 6.25 MG PO TABS: 6.2500 mg | ORAL_TABLET | Freq: Two times a day (BID) | ORAL | Status: DC

## 2018-09-23 MED ORDER — ACETAMINOPHEN 650 MG RE SUPP: 650.0000 mg | Freq: Four times a day (QID) | RECTAL | Status: DC | PRN

## 2018-09-23 MED ORDER — TIOTROPIUM BROMIDE MONOHYDRATE 18 MCG IN CAPS: 18.0000 ug | ORAL_CAPSULE | Freq: Every day | RESPIRATORY_TRACT | Status: DC

## 2018-09-23 MED ORDER — ALBUTEROL SULFATE (2.5 MG/3ML) 0.083% IN NEBU: 2.5000 mg | INHALATION_SOLUTION | Freq: Four times a day (QID) | RESPIRATORY_TRACT | Status: DC | PRN

## 2018-09-23 MED ORDER — ONDANSETRON HCL 4 MG/2ML IJ SOLN: 4.0000 mg | Freq: Four times a day (QID) | INTRAMUSCULAR | Status: DC | PRN

## 2018-09-23 MED ORDER — ONDANSETRON HCL 4 MG PO TABS: 4.0000 mg | ORAL_TABLET | Freq: Four times a day (QID) | ORAL | Status: DC | PRN

## 2018-09-23 MED ORDER — SUCRALFATE 1 G PO TABS: 1.0000 g | ORAL_TABLET | Freq: Four times a day (QID) | ORAL | Status: DC

## 2018-09-23 MED ORDER — NITROGLYCERIN 0.4 MG SL SUBL: 0.4000 mg | SUBLINGUAL_TABLET | SUBLINGUAL | Status: DC | PRN

## 2018-09-23 MED ORDER — MORPHINE SULFATE (PF) 4 MG/ML IV SOLN
4.0000 mg | Freq: Once | INTRAVENOUS | Status: AC
  Administered 2018-09-23: 04:00:00 4 mg via INTRAVENOUS
  Filled 2018-09-23: qty 1

## 2018-09-23 MED ORDER — IOPAMIDOL (ISOVUE-370) INJECTION 76%
100.0000 mL | Freq: Once | INTRAVENOUS | Status: AC | PRN
  Administered 2018-09-23: 100 mL via INTRAVENOUS
  Filled 2018-09-23: qty 100

## 2018-09-23 MED ORDER — NICOTINE 14 MG/24HR TD PT24: 14.0000 mg | MEDICATED_PATCH | Freq: Every day | TRANSDERMAL | Status: DC

## 2018-09-23 MED ORDER — ACETAMINOPHEN 325 MG PO TABS: 650.0000 mg | ORAL_TABLET | Freq: Four times a day (QID) | ORAL | Status: DC | PRN

## 2018-09-23 MED ORDER — CARVEDILOL 6.25 MG PO TABS: 3.1250 mg | ORAL_TABLET | Freq: Two times a day (BID) | ORAL | Status: DC

## 2018-09-23 MED ORDER — SODIUM CHLORIDE 0.9 % IV SOLN: INTRAVENOUS | Status: DC

## 2018-09-23 MED ORDER — TRAZODONE HCL 50 MG PO TABS: 25.0000 mg | ORAL_TABLET | Freq: Every evening | ORAL | Status: DC | PRN

## 2018-09-23 MED ORDER — PANTOPRAZOLE SODIUM 40 MG IV SOLR: 40.0000 mg | Freq: Two times a day (BID) | INTRAVENOUS | Status: DC
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
## 2018-09-23 NOTE — ED Notes (Signed)
Patient transported to CT 

## 2018-09-23 NOTE — ED Notes (Signed)
Patient sleeping soundly, will monitor for pain
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
## 2018-09-23 NOTE — ED Notes (Signed)
Patient agitated, wanting to leave AMA. Discussed risks, notified patient that she is waiting for bed and cannot yet get more medication. Patient called mom who will pick her up

## 2018-09-28 ENCOUNTER — Other Ambulatory Visit: Payer: Self-pay | Admitting: Gastroenterology

## 2018-09-28 DIAGNOSIS — R110 Nausea: Secondary | ICD-10-CM

## 2018-09-28 DIAGNOSIS — R1084 Generalized abdominal pain: Secondary | ICD-10-CM

## 2018-10-13 ENCOUNTER — Other Ambulatory Visit: Payer: Self-pay

## 2018-10-13 ENCOUNTER — Ambulatory Visit
Admission: RE | Admit: 2018-10-13 | Discharge: 2018-10-13 | Disposition: A | Discharge status: Home / Self Care | Payer: Medicare Other | Source: Ambulatory Visit | Attending: Gastroenterology | Admitting: Gastroenterology

## 2018-10-13 DIAGNOSIS — R1084 Generalized abdominal pain: Secondary | ICD-10-CM | POA: Diagnosis present

## 2018-10-13 DIAGNOSIS — R110 Nausea: Secondary | ICD-10-CM | POA: Insufficient documentation

## 2018-12-08 ENCOUNTER — Other Ambulatory Visit: Payer: Self-pay | Admitting: Internal Medicine

## 2018-12-08 DIAGNOSIS — Z1231 Encounter for screening mammogram for malignant neoplasm of breast: Secondary | ICD-10-CM

## 2019-12-06 DIAGNOSIS — S92424A Nondisplaced fracture of distal phalanx of right great toe, initial encounter for closed fracture: Secondary | ICD-10-CM | POA: Diagnosis not present

## 2020-03-07 DIAGNOSIS — Z20822 Contact with and (suspected) exposure to covid-19: Secondary | ICD-10-CM | POA: Diagnosis not present

## 2020-03-07 DIAGNOSIS — U071 COVID-19: Secondary | ICD-10-CM | POA: Diagnosis not present

## 2020-03-19 DIAGNOSIS — Z03818 Encounter for observation for suspected exposure to other biological agents ruled out: Secondary | ICD-10-CM | POA: Diagnosis not present

## 2020-03-19 DIAGNOSIS — Z20822 Contact with and (suspected) exposure to covid-19: Secondary | ICD-10-CM | POA: Diagnosis not present

## 2020-05-07 DIAGNOSIS — I2510 Atherosclerotic heart disease of native coronary artery without angina pectoris: Secondary | ICD-10-CM | POA: Diagnosis not present

## 2020-05-07 DIAGNOSIS — K219 Gastro-esophageal reflux disease without esophagitis: Secondary | ICD-10-CM | POA: Diagnosis not present

## 2020-05-07 DIAGNOSIS — K922 Gastrointestinal hemorrhage, unspecified: Secondary | ICD-10-CM | POA: Diagnosis not present

## 2020-05-07 DIAGNOSIS — Z23 Encounter for immunization: Secondary | ICD-10-CM | POA: Diagnosis not present

## 2020-05-08 ENCOUNTER — Other Ambulatory Visit: Payer: Self-pay | Admitting: Internal Medicine

## 2020-05-08 DIAGNOSIS — Z1231 Encounter for screening mammogram for malignant neoplasm of breast: Secondary | ICD-10-CM

## 2020-06-11 ENCOUNTER — Emergency Department
Admission: EM | Admit: 2020-06-11 | Discharge: 2020-06-11 | Disposition: A | Discharge status: Home / Self Care | Payer: Medicare Other | Attending: Emergency Medicine | Admitting: Emergency Medicine

## 2020-06-11 ENCOUNTER — Other Ambulatory Visit: Payer: Self-pay

## 2020-06-11 ENCOUNTER — Emergency Department: Payer: Medicare Other

## 2020-06-11 DIAGNOSIS — I499 Cardiac arrhythmia, unspecified: Secondary | ICD-10-CM | POA: Diagnosis not present

## 2020-06-11 DIAGNOSIS — Z743 Need for continuous supervision: Secondary | ICD-10-CM | POA: Diagnosis not present

## 2020-06-11 DIAGNOSIS — R1031 Right lower quadrant pain: Secondary | ICD-10-CM | POA: Diagnosis not present

## 2020-06-11 DIAGNOSIS — R1110 Vomiting, unspecified: Secondary | ICD-10-CM | POA: Diagnosis not present

## 2020-06-11 DIAGNOSIS — Z7982 Long term (current) use of aspirin: Secondary | ICD-10-CM | POA: Diagnosis not present

## 2020-06-11 DIAGNOSIS — F17210 Nicotine dependence, cigarettes, uncomplicated: Secondary | ICD-10-CM | POA: Diagnosis not present

## 2020-06-11 DIAGNOSIS — Z79899 Other long term (current) drug therapy: Secondary | ICD-10-CM | POA: Diagnosis not present

## 2020-06-11 DIAGNOSIS — R1011 Right upper quadrant pain: Secondary | ICD-10-CM | POA: Diagnosis not present

## 2020-06-11 DIAGNOSIS — Z955 Presence of coronary angioplasty implant and graft: Secondary | ICD-10-CM | POA: Insufficient documentation

## 2020-06-11 DIAGNOSIS — K219 Gastro-esophageal reflux disease without esophagitis: Secondary | ICD-10-CM | POA: Diagnosis not present

## 2020-06-11 DIAGNOSIS — J449 Chronic obstructive pulmonary disease, unspecified: Secondary | ICD-10-CM | POA: Insufficient documentation

## 2020-06-11 DIAGNOSIS — I2510 Atherosclerotic heart disease of native coronary artery without angina pectoris: Secondary | ICD-10-CM | POA: Diagnosis not present

## 2020-06-11 DIAGNOSIS — R112 Nausea with vomiting, unspecified: Secondary | ICD-10-CM

## 2020-06-11 DIAGNOSIS — Z85038 Personal history of other malignant neoplasm of large intestine: Secondary | ICD-10-CM | POA: Diagnosis not present

## 2020-06-11 DIAGNOSIS — Z8544 Personal history of malignant neoplasm of other female genital organs: Secondary | ICD-10-CM | POA: Diagnosis not present

## 2020-06-11 DIAGNOSIS — R109 Unspecified abdominal pain: Secondary | ICD-10-CM | POA: Diagnosis not present

## 2020-06-11 DIAGNOSIS — N201 Calculus of ureter: Secondary | ICD-10-CM

## 2020-06-11 DIAGNOSIS — R6889 Other general symptoms and signs: Secondary | ICD-10-CM | POA: Diagnosis not present

## 2020-06-11 DIAGNOSIS — R1033 Periumbilical pain: Secondary | ICD-10-CM | POA: Insufficient documentation

## 2020-06-11 DIAGNOSIS — N1330 Unspecified hydronephrosis: Secondary | ICD-10-CM | POA: Diagnosis not present

## 2020-06-11 DIAGNOSIS — I10 Essential (primary) hypertension: Secondary | ICD-10-CM | POA: Diagnosis not present

## 2020-06-11 DIAGNOSIS — R0789 Other chest pain: Secondary | ICD-10-CM | POA: Diagnosis not present

## 2020-06-11 DIAGNOSIS — K8020 Calculus of gallbladder without cholecystitis without obstruction: Secondary | ICD-10-CM | POA: Diagnosis not present

## 2020-06-11 LAB — URINALYSIS, COMPLETE (UACMP) WITH MICROSCOPIC
Bacteria, UA: NONE SEEN
Bilirubin Urine: NEGATIVE
Glucose, UA: 50 mg/dL — AB
Ketones, ur: NEGATIVE mg/dL
Leukocytes,Ua: NEGATIVE
Nitrite: NEGATIVE
Protein, ur: NEGATIVE mg/dL
RBC / HPF: >50 RBC/hpf — ABNORMAL HIGH (ref 0–5)
Specific Gravity, Urine: 1.017 (ref 1.005–1.030)
pH: 9 — ABNORMAL HIGH (ref 5.0–8.0)

## 2020-06-11 LAB — CBC WITH DIFFERENTIAL/PLATELET
Abs Immature Granulocytes: 0.03 10*3/uL (ref 0.00–0.07)
Basophils Absolute: 0.1 10*3/uL (ref 0.0–0.1)
Basophils Relative: 1 %
Eosinophils Absolute: 0.1 10*3/uL (ref 0.0–0.5)
Eosinophils Relative: 1 %
HCT: 43.3 % (ref 36.0–46.0)
Hemoglobin: 14.7 g/dL (ref 12.0–15.0)
Immature Granulocytes: 0 %
Lymphocytes Relative: 23 %
Lymphs Abs: 2.3 10*3/uL (ref 0.7–4.0)
MCH: 30.9 pg (ref 26.0–34.0)
MCHC: 33.9 g/dL (ref 30.0–36.0)
MCV: 91.2 fL (ref 80.0–100.0)
Monocytes Absolute: 0.6 10*3/uL (ref 0.1–1.0)
Monocytes Relative: 7 %
Neutro Abs: 6.6 10*3/uL (ref 1.7–7.7)
Neutrophils Relative %: 68 %
Platelets: 327 10*3/uL (ref 150–400)
RBC: 4.75 MIL/uL (ref 3.87–5.11)
RDW: 12.4 % (ref 11.5–15.5)
WBC: 9.7 10*3/uL (ref 4.0–10.5)
nRBC: 0 % (ref 0.0–0.2)

## 2020-06-11 LAB — COMPREHENSIVE METABOLIC PANEL
ALT: 17 U/L (ref 0–44)
AST: 24 U/L (ref 15–41)
Albumin: 4.1 g/dL (ref 3.5–5.0)
Alkaline Phosphatase: 73 U/L (ref 38–126)
Anion gap: 8 (ref 5–15)
BUN: 32 mg/dL — ABNORMAL HIGH (ref 6–20)
CO2: 23 mmol/L (ref 22–32)
Calcium: 9.3 mg/dL (ref 8.9–10.3)
Chloride: 109 mmol/L (ref 98–111)
Creatinine, Ser: 1.16 mg/dL — ABNORMAL HIGH (ref 0.44–1.00)
GFR, Estimated: 56 mL/min — ABNORMAL LOW (ref >60)
Glucose, Bld: 119 mg/dL — ABNORMAL HIGH (ref 70–99)
Potassium: 3.7 mmol/L (ref 3.5–5.1)
Sodium: 140 mmol/L (ref 135–145)
Total Bilirubin: 0.6 mg/dL (ref 0.3–1.2)
Total Protein: 8 g/dL (ref 6.5–8.1)

## 2020-06-11 LAB — TROPONIN I (HIGH SENSITIVITY): Troponin I (High Sensitivity): 16 ng/L (ref <18)

## 2020-06-11 LAB — LIPASE, BLOOD: Lipase: 48 U/L (ref 11–51)

## 2020-06-11 LAB — MAGNESIUM: Magnesium: 2 mg/dL (ref 1.7–2.4)

## 2020-06-11 MED ORDER — CLONIDINE HCL 0.1 MG PO TABS
0.1000 mg | ORAL_TABLET | ORAL | Status: AC
  Administered 2020-06-11: 0.1 mg via ORAL
  Filled 2020-06-11: qty 1

## 2020-06-11 MED ORDER — OXYCODONE-ACETAMINOPHEN 5-325 MG PO TABS
1.0000 | ORAL_TABLET | Freq: Once | ORAL | Status: AC
  Administered 2020-06-11: 1 via ORAL
  Filled 2020-06-11: qty 1

## 2020-06-11 MED ORDER — FENTANYL CITRATE (PF) 100 MCG/2ML IJ SOLN: 50.0000 ug | Freq: Once | INTRAMUSCULAR | Status: AC

## 2020-06-11 MED ORDER — CLONIDINE HCL 0.1 MG PO TABS
0.1000 mg | ORAL_TABLET | Freq: Once | ORAL | Status: AC
  Administered 2020-06-11: 0.1 mg via ORAL
  Filled 2020-06-11: qty 1

## 2020-06-11 MED ORDER — MIDAZOLAM HCL 2 MG/2ML IJ SOLN
0.5000 mg | Freq: Once | INTRAMUSCULAR | Status: AC
  Administered 2020-06-11: 0.5 mg via INTRAVENOUS
  Filled 2020-06-11: qty 2

## 2020-06-11 MED ORDER — OXYCODONE-ACETAMINOPHEN 5-325 MG PO TABS: 1.0000 | ORAL_TABLET | ORAL | 0 refills | Status: AC | PRN

## 2020-06-11 MED ORDER — IOHEXOL 350 MG/ML SOLN
100.0000 mL | Freq: Once | INTRAVENOUS | Status: AC | PRN
  Administered 2020-06-11: 100 mL via INTRAVENOUS

## 2020-06-11 MED ORDER — ONDANSETRON 4 MG PO TBDP: 4.0000 mg | ORAL_TABLET | Freq: Three times a day (TID) | ORAL | 0 refills | Status: DC | PRN

## 2020-06-11 MED ORDER — MORPHINE SULFATE (PF) 4 MG/ML IV SOLN
4.0000 mg | Freq: Once | INTRAVENOUS | Status: AC
  Administered 2020-06-11: 4 mg via INTRAVENOUS
  Filled 2020-06-11: qty 1

## 2020-06-11 MED ORDER — AMLODIPINE BESYLATE 5 MG PO TABS
10.0000 mg | ORAL_TABLET | Freq: Once | ORAL | Status: AC
  Administered 2020-06-11: 10 mg via ORAL
  Filled 2020-06-11: qty 2

## 2020-06-11 MED ORDER — ONDANSETRON HCL 4 MG/2ML IJ SOLN: 4.0000 mg | Freq: Once | INTRAMUSCULAR | Status: DC

## 2020-06-11 MED ORDER — LOSARTAN POTASSIUM 50 MG PO TABS
100.0000 mg | ORAL_TABLET | Freq: Once | ORAL | Status: AC
  Administered 2020-06-11: 100 mg via ORAL
  Filled 2020-06-11: qty 2

## 2020-06-11 MED ORDER — FENTANYL CITRATE (PF) 100 MCG/2ML IJ SOLN
INTRAMUSCULAR | Status: AC
  Administered 2020-06-11: 50 ug via INTRAVENOUS
  Filled 2020-06-11: qty 2

## 2020-06-11 NOTE — ED Notes (Signed)
Unable to obtain axillary or oral temp. Pt refusing rectal.
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
## 2020-06-11 NOTE — ED Triage Notes (Signed)
Pt presents to ER via ems c/o RLQ abdominal pain that radiates into back.  Pt states pain started around 2 hrs ago and has been constant 10/10 since then.  Pt very fidgety in triage.  Pt speaking in complete sentences, no other complaints at this time.

## 2020-06-11 NOTE — ED Notes (Signed)
Unable to obtain oral temp.  Pt refused rectal temp.

## 2020-06-11 NOTE — ED Notes (Signed)
Pt calm , collective, discharge with mother  

## 2020-06-11 NOTE — ED Notes (Signed)
Bair hugger applied per MD

## 2020-06-11 NOTE — ED Notes (Signed)
Discharge intructions reviewed with pt and mother .

## 2020-06-11 NOTE — ED Provider Notes (Signed)
-----------------------------------------   7:05 AM on 06/11/2020 -----------------------------------------  Blood pressure (!) 184/109, pulse 75, resp. rate 17, height 5\' 4"  (1.626 m), weight 49.9 kg, SpO2 94 %.  Assuming care from Dr. Tamala Julian.  In short, Carol Henderson is a 55 y.o. female with a chief complaint of Abdominal Pain .  Refer to the original H&P for additional details.  The current plan of care is to follow-up CT and lab results for abdominal pain.  ----------------------------------------- 11:07 AM on 06/11/2020 -----------------------------------------  CTA shows right-sided obstructive ureterolith, explaining patient's pain.  UA shows no signs of infection.  Additionally, CTA shows obstruction or high-grade stenosis of IMA with reconstitution peripherally along with chronic obstruction of celiac artery, SMA widely patent.  These findings were discussed with Dr. Lucky Cowboy of vascular surgery, who agrees that no acute intervention is needed.  Patient is tolerating p.o. and states pain is much improved, she is appropriate for discharge home with urology follow-up.  She was counseled to return to the ED for new worsening symptoms, patient agrees with plan.    Blake Divine, MD 06/11/20 1109

## 2020-06-11 NOTE — ED Notes (Signed)
Pt ca

## 2020-06-11 NOTE — ED Notes (Signed)
Pt to CT

## 2020-06-11 NOTE — ED Notes (Signed)
Spoke with MD regarding pt blood pressure and pain. MD will order meds , after med pt is ok to be discharged

## 2020-06-12 DIAGNOSIS — Z23 Encounter for immunization: Secondary | ICD-10-CM | POA: Diagnosis not present

## 2020-06-17 ENCOUNTER — Other Ambulatory Visit: Payer: Self-pay

## 2020-06-17 ENCOUNTER — Encounter: Payer: Self-pay | Admitting: Urology

## 2020-06-17 ENCOUNTER — Ambulatory Visit (INDEPENDENT_AMBULATORY_CARE_PROVIDER_SITE_OTHER): Payer: Medicare Other | Admitting: Urology

## 2020-06-17 VITALS — BP 136/77 | HR 90 | Ht 64.0 in | Wt 110.0 lb

## 2020-06-17 DIAGNOSIS — N200 Calculus of kidney: Secondary | ICD-10-CM

## 2020-06-17 LAB — URINALYSIS, COMPLETE
Bilirubin, UA: NEGATIVE
Glucose, UA: NEGATIVE
Ketones, UA: NEGATIVE
Leukocytes,UA: NEGATIVE
Nitrite, UA: NEGATIVE
Protein,UA: NEGATIVE
RBC, UA: NEGATIVE
Specific Gravity, UA: 1.025 (ref 1.005–1.030)
Urobilinogen, Ur: 0.2 mg/dL (ref 0.2–1.0)
pH, UA: 6 (ref 5.0–7.5)

## 2020-06-17 LAB — MICROSCOPIC EXAMINATION: Bacteria, UA: NONE SEEN
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
## 2020-11-18 DIAGNOSIS — K279 Peptic ulcer, site unspecified, unspecified as acute or chronic, without hemorrhage or perforation: Secondary | ICD-10-CM | POA: Diagnosis not present

## 2020-11-18 DIAGNOSIS — K922 Gastrointestinal hemorrhage, unspecified: Secondary | ICD-10-CM | POA: Diagnosis not present

## 2020-11-18 DIAGNOSIS — I2510 Atherosclerotic heart disease of native coronary artery without angina pectoris: Secondary | ICD-10-CM | POA: Diagnosis not present

## 2020-11-18 DIAGNOSIS — J449 Chronic obstructive pulmonary disease, unspecified: Secondary | ICD-10-CM | POA: Diagnosis not present

## 2020-11-18 DIAGNOSIS — I10 Essential (primary) hypertension: Secondary | ICD-10-CM | POA: Diagnosis not present

## 2020-11-18 DIAGNOSIS — J301 Allergic rhinitis due to pollen: Secondary | ICD-10-CM | POA: Diagnosis not present

## 2020-11-18 DIAGNOSIS — K219 Gastro-esophageal reflux disease without esophagitis: Secondary | ICD-10-CM | POA: Diagnosis not present

## 2020-12-09 ENCOUNTER — Other Ambulatory Visit: Admission: RE | Admit: 2020-12-09 | Payer: Medicare Other | Source: Ambulatory Visit

## 2020-12-09 DIAGNOSIS — I10 Essential (primary) hypertension: Secondary | ICD-10-CM | POA: Diagnosis not present

## 2020-12-09 DIAGNOSIS — R7301 Impaired fasting glucose: Secondary | ICD-10-CM | POA: Diagnosis not present

## 2021-01-16 DIAGNOSIS — L309 Dermatitis, unspecified: Secondary | ICD-10-CM | POA: Diagnosis not present

## 2021-01-16 DIAGNOSIS — J449 Chronic obstructive pulmonary disease, unspecified: Secondary | ICD-10-CM | POA: Diagnosis not present

## 2021-01-16 DIAGNOSIS — K219 Gastro-esophageal reflux disease without esophagitis: Secondary | ICD-10-CM | POA: Diagnosis not present

## 2021-01-16 DIAGNOSIS — I2510 Atherosclerotic heart disease of native coronary artery without angina pectoris: Secondary | ICD-10-CM | POA: Diagnosis not present

## 2021-01-16 DIAGNOSIS — I10 Essential (primary) hypertension: Secondary | ICD-10-CM | POA: Diagnosis not present

## 2021-01-16 DIAGNOSIS — K922 Gastrointestinal hemorrhage, unspecified: Secondary | ICD-10-CM | POA: Diagnosis not present

## 2021-02-04 DIAGNOSIS — R600 Localized edema: Secondary | ICD-10-CM | POA: Diagnosis not present

## 2021-02-04 DIAGNOSIS — Z043 Encounter for examination and observation following other accident: Secondary | ICD-10-CM | POA: Diagnosis not present

## 2021-02-04 DIAGNOSIS — Z5321 Procedure and treatment not carried out due to patient leaving prior to being seen by health care provider: Secondary | ICD-10-CM | POA: Diagnosis not present

## 2021-02-13 DIAGNOSIS — I10 Essential (primary) hypertension: Secondary | ICD-10-CM | POA: Diagnosis not present

## 2021-02-13 DIAGNOSIS — I2510 Atherosclerotic heart disease of native coronary artery without angina pectoris: Secondary | ICD-10-CM | POA: Diagnosis not present

## 2021-02-13 DIAGNOSIS — K922 Gastrointestinal hemorrhage, unspecified: Secondary | ICD-10-CM | POA: Diagnosis not present

## 2021-02-13 DIAGNOSIS — L309 Dermatitis, unspecified: Secondary | ICD-10-CM | POA: Diagnosis not present

## 2021-02-13 DIAGNOSIS — J449 Chronic obstructive pulmonary disease, unspecified: Secondary | ICD-10-CM | POA: Diagnosis not present

## 2021-02-13 DIAGNOSIS — N189 Chronic kidney disease, unspecified: Secondary | ICD-10-CM | POA: Diagnosis not present

## 2021-02-13 DIAGNOSIS — M7021 Olecranon bursitis, right elbow: Secondary | ICD-10-CM | POA: Diagnosis not present

## 2021-02-13 DIAGNOSIS — K279 Peptic ulcer, site unspecified, unspecified as acute or chronic, without hemorrhage or perforation: Secondary | ICD-10-CM | POA: Diagnosis not present

## 2021-02-13 DIAGNOSIS — K219 Gastro-esophageal reflux disease without esophagitis: Secondary | ICD-10-CM | POA: Diagnosis not present

## 2021-04-21 DIAGNOSIS — K219 Gastro-esophageal reflux disease without esophagitis: Secondary | ICD-10-CM | POA: Diagnosis not present

## 2021-04-21 DIAGNOSIS — M7021 Olecranon bursitis, right elbow: Secondary | ICD-10-CM | POA: Diagnosis not present

## 2021-04-21 DIAGNOSIS — J449 Chronic obstructive pulmonary disease, unspecified: Secondary | ICD-10-CM | POA: Diagnosis not present

## 2021-04-21 DIAGNOSIS — K279 Peptic ulcer, site unspecified, unspecified as acute or chronic, without hemorrhage or perforation: Secondary | ICD-10-CM | POA: Diagnosis not present

## 2021-04-21 DIAGNOSIS — I2510 Atherosclerotic heart disease of native coronary artery without angina pectoris: Secondary | ICD-10-CM | POA: Diagnosis not present

## 2021-04-21 DIAGNOSIS — N189 Chronic kidney disease, unspecified: Secondary | ICD-10-CM | POA: Diagnosis not present

## 2021-04-21 DIAGNOSIS — K922 Gastrointestinal hemorrhage, unspecified: Secondary | ICD-10-CM | POA: Diagnosis not present

## 2021-05-22 DIAGNOSIS — J449 Chronic obstructive pulmonary disease, unspecified: Secondary | ICD-10-CM | POA: Diagnosis not present

## 2021-05-22 DIAGNOSIS — I2510 Atherosclerotic heart disease of native coronary artery without angina pectoris: Secondary | ICD-10-CM | POA: Diagnosis not present

## 2021-05-22 DIAGNOSIS — K219 Gastro-esophageal reflux disease without esophagitis: Secondary | ICD-10-CM | POA: Diagnosis not present

## 2021-05-22 DIAGNOSIS — M7021 Olecranon bursitis, right elbow: Secondary | ICD-10-CM | POA: Diagnosis not present

## 2021-05-22 DIAGNOSIS — I10 Essential (primary) hypertension: Secondary | ICD-10-CM | POA: Diagnosis not present

## 2021-05-22 DIAGNOSIS — K922 Gastrointestinal hemorrhage, unspecified: Secondary | ICD-10-CM | POA: Diagnosis not present

## 2021-05-22 DIAGNOSIS — N189 Chronic kidney disease, unspecified: Secondary | ICD-10-CM | POA: Diagnosis not present

## 2022-03-01 DIAGNOSIS — N189 Chronic kidney disease, unspecified: Secondary | ICD-10-CM | POA: Diagnosis not present

## 2022-03-01 DIAGNOSIS — I2510 Atherosclerotic heart disease of native coronary artery without angina pectoris: Secondary | ICD-10-CM | POA: Diagnosis not present

## 2022-03-01 DIAGNOSIS — K922 Gastrointestinal hemorrhage, unspecified: Secondary | ICD-10-CM | POA: Diagnosis not present

## 2022-03-01 DIAGNOSIS — K279 Peptic ulcer, site unspecified, unspecified as acute or chronic, without hemorrhage or perforation: Secondary | ICD-10-CM | POA: Diagnosis not present

## 2022-03-01 DIAGNOSIS — J449 Chronic obstructive pulmonary disease, unspecified: Secondary | ICD-10-CM | POA: Diagnosis not present

## 2022-03-01 DIAGNOSIS — K219 Gastro-esophageal reflux disease without esophagitis: Secondary | ICD-10-CM | POA: Diagnosis not present

## 2022-03-01 DIAGNOSIS — M7021 Olecranon bursitis, right elbow: Secondary | ICD-10-CM | POA: Diagnosis not present

## 2022-03-01 DIAGNOSIS — I10 Essential (primary) hypertension: Secondary | ICD-10-CM | POA: Diagnosis not present

## 2022-06-09 ENCOUNTER — Other Ambulatory Visit: Payer: Self-pay | Admitting: Internal Medicine

## 2022-08-27 ENCOUNTER — Other Ambulatory Visit: Payer: Self-pay

## 2022-08-27 ENCOUNTER — Emergency Department
Admission: EM | Admit: 2022-08-27 | Discharge: 2022-08-27 | Disposition: A | Discharge status: Home / Self Care | Payer: 59 | Attending: Emergency Medicine | Admitting: Emergency Medicine

## 2022-08-27 DIAGNOSIS — W25XXXA Contact with sharp glass, initial encounter: Secondary | ICD-10-CM | POA: Diagnosis not present

## 2022-08-27 DIAGNOSIS — S81811A Laceration without foreign body, right lower leg, initial encounter: Secondary | ICD-10-CM | POA: Insufficient documentation

## 2022-08-27 DIAGNOSIS — Y92008 Other place in unspecified non-institutional (private) residence as the place of occurrence of the external cause: Secondary | ICD-10-CM | POA: Diagnosis not present

## 2022-08-27 DIAGNOSIS — S8991XA Unspecified injury of right lower leg, initial encounter: Secondary | ICD-10-CM | POA: Diagnosis present

## 2022-08-27 DIAGNOSIS — Z23 Encounter for immunization: Secondary | ICD-10-CM | POA: Diagnosis not present

## 2022-08-27 MED ORDER — CEPHALEXIN 500 MG PO CAPS: 1000.0000 mg | ORAL_CAPSULE | Freq: Two times a day (BID) | ORAL | 0 refills | Status: AC

## 2022-08-27 MED ORDER — TETANUS-DIPHTH-ACELL PERTUSSIS 5-2.5-18.5 LF-MCG/0.5 IM SUSY
0.5000 mL | PREFILLED_SYRINGE | Freq: Once | INTRAMUSCULAR | Status: AC
  Administered 2022-08-27: 0.5 mL via INTRAMUSCULAR
  Filled 2022-08-27: qty 0.5

## 2022-08-27 MED ORDER — LIDOCAINE-EPINEPHRINE 1 %-1:200000 IJ SOLN
20.0000 mL | Freq: Once | INTRAMUSCULAR | Status: AC
Start: 2022-08-27 — End: 2022-08-27
  Administered 2022-08-27: 20 mL
  Filled 2022-08-27: qty 20

## 2022-08-27 NOTE — ED Notes (Signed)
Pt reports always runs high BP takes several medications twice a day, will take her medications when she gets home

## 2022-08-27 NOTE — ED Provider Notes (Signed)
[facility] Provider Note  Patient Contact: 6:29 PM (approximate)   History   Laceration   HPI  Carol Henderson is a 57 y.o. female who presents the emergency department complaining of a laceration to her right lower leg.  Patient had a broken glass table that was on the porch and fell and caught her leg on her thumb.  She sustained a small laceration to the thumb with a deep laceration to her right leg.  She had pulsatile bleeding initially but this has been controlled with direct pressure.  No other injury or complaint at this time.     Physical Exam   Triage Vital Signs: ED Triage Vitals  Enc Vitals Group     BP 08/27/22 1639 (!) 172/125     Pulse Rate 08/27/22 1639 (!) 104     Resp 08/27/22 1639 18     Temp 08/27/22 1639 98.6 F (37 C)     Temp Source 08/27/22 1639 Oral     SpO2 08/27/22 1639 98 %     Weight 08/27/22 1638 110 lb (49.9 kg)     Height 08/27/22 1638 5\' 2"  (1.575 m)     Head Circumference --      Peak Flow --      Pain Score --      Pain Loc --      Pain Edu? --      Excl. in GC? --     Most recent vital signs: Vitals:   08/27/22 1639  BP: (!) 172/125  Pulse: (!) 104  Resp: 18  Temp: 98.6 F (37 C)  SpO2: 98%     General: Alert and in no acute distress. Cardiovascular:  Good peripheral perfusion Respiratory: Normal respiratory effort without tachypnea or retractions. Lungs CTAB.  Musculoskeletal: Visualization of the right lower extremity reveals a linear laceration with no evidence of retained foreign body that measures approximately 8 cm in length.  Edges are smooth in nature.  Pulse intact distally.  Range of motion intact distally. Neurologic:  No gross focal neurologic deficits are appreciated.  Skin:   No rash noted Other:   ED Results / Procedures / Treatments   Labs (all labs ordered are listed, but only abnormal results are displayed) Labs Reviewed - No data to  display   EKG     RADIOLOGY    No results found.  PROCEDURES:  Critical Care performed: No  ..Laceration Repair  Date/Time: 08/27/2022 6:59 PM  Performed by: Racheal Patches, PA-C Authorized by: Racheal Patches, PA-C   Consent:    Consent obtained:  Verbal   Consent given by:  Patient   Risks discussed:  Infection and pain Universal protocol:    Procedure explained and questions answered to patient or proxy's satisfaction: yes     Immediately prior to procedure, a time out was called: yes     Patient identity confirmed:  Verbally with patient Anesthesia:    Anesthesia method:  Local infiltration   Local anesthetic:  Lidocaine 1% WITH epi Laceration details:    Location:  Leg   Leg location:  R lower leg   Length (cm):  8 Pre-procedure details:    Preparation:  Patient was prepped and draped in usual sterile fashion Exploration:    Hemostasis achieved with:  Direct pressure   Wound exploration: wound explored through full range of motion and entire depth of wound visualized     Wound extent: no foreign body, no nerve  damage, no tendon damage and no underlying fracture     Contaminated: no   Treatment:    Area cleansed with:  Povidone-iodine and saline   Amount of cleaning:  Extensive   Irrigation solution:  Sterile saline   Irrigation volume:  1L   Irrigation method:  Syringe Skin repair:    Repair method:  Sutures   Suture size:  4-0   Suture material:  Nylon   Suture technique:  Running locked   Number of sutures:  1 (1 running interlocked suture with 12 throws) Approximation:    Approximation:  Close Repair type:    Repair type:  Simple Post-procedure details:    Dressing:  Open (no dressing)   Procedure completion:  Tolerated well, no immediate complications    MEDICATIONS ORDERED IN ED: Medications  lidocaine-EPINEPHrine (PF) (XYLOCAINE-EPINEPHrine) 1 %-1:200000 (PF) injection 20 mL (20 mLs Infiltration Given 08/27/22 1842)  Tdap  (BOOSTRIX) injection 0.5 mL (0.5 mLs Intramuscular Given 08/27/22 1841)     IMPRESSION / MDM / ASSESSMENT AND PLAN / ED COURSE  I reviewed the triage vital signs and the nursing notes.                                 Differential diagnosis includes, but is not limited to, leg laceration, vascular injury, retained foreign body  Patient's presentation is most consistent with acute presentation with potential threat to life or bodily function.   Patient's diagnosis is consistent with leg laceration.  Patient presents emergency department after having a glass table fall and lacerated her leg.  She states that she had pulsatile bleeding initially though there was no ongoing bleeding on my assessment.  Patient had sutures placed as described above.  Wound care instructions discussed with patient.  Follow-up instructions are given to the patient as well.  Antibiotics prophylactically.  Tetanus shot updated today..  Patient is given ED precautions to return to the ED for any worsening or new symptoms.     FINAL CLINICAL IMPRESSION(S) / ED DIAGNOSES   Final diagnoses:  Laceration of right lower extremity, initial encounter     Rx / DC Orders   ED Discharge Orders          Ordered    cephALEXin (KEFLEX) 500 MG capsule  2 times daily        08/27/22 1902             Note:  This document was prepared using Dragon voice recognition software and may include unintentional dictation errors.   Lanette Hampshire 08/27/22 Gay Filler, MD 08/27/22 Windell Moment

## 2022-08-27 NOTE — ED Triage Notes (Signed)
Pt to ED for laceration from glass coffee table to RLE, about 2 inches. Injury is bleeding, pressure applied by triage nurse. Pt is feeling coldness and shooting pain and aching. Laceration appears deep, subcut tissue exposed. Pressure bandage applied.. Also small lac to R thumb. Unknown Tdap status.

## 2022-09-12 ENCOUNTER — Other Ambulatory Visit: Payer: Self-pay | Admitting: Internal Medicine

## 2022-09-14 NOTE — Telephone Encounter (Signed)
Needs appt

## 2022-10-30 ENCOUNTER — Other Ambulatory Visit: Payer: Self-pay | Admitting: Internal Medicine

## 2022-10-30 DIAGNOSIS — J449 Chronic obstructive pulmonary disease, unspecified: Secondary | ICD-10-CM

## 2022-11-08 ENCOUNTER — Other Ambulatory Visit: Payer: Self-pay | Admitting: Internal Medicine

## 2022-12-07 ENCOUNTER — Other Ambulatory Visit: Payer: Self-pay | Admitting: Internal Medicine

## 2022-12-08 ENCOUNTER — Other Ambulatory Visit: Payer: Self-pay

## 2022-12-28 ENCOUNTER — Other Ambulatory Visit: Payer: Self-pay | Admitting: Internal Medicine

## 2022-12-29 ENCOUNTER — Other Ambulatory Visit: Payer: Self-pay

## 2023-01-08 ENCOUNTER — Other Ambulatory Visit: Payer: Self-pay | Admitting: Internal Medicine

## 2023-01-08 DIAGNOSIS — J449 Chronic obstructive pulmonary disease, unspecified: Secondary | ICD-10-CM

## 2023-01-25 ENCOUNTER — Telehealth: Payer: Self-pay

## 2023-01-25 NOTE — Patient Outreach (Signed)
[facility] Assistant attempted to call patient on today regarding preventative mammogram screening. No answer from patient after multiple rings. Assistant unable to leave confidential voicemail for patient to return call.  Will call back patient back for final attempt.  Baruch Gouty [facility] Assistant [facility] [phone]

## 2023-02-08 ENCOUNTER — Ambulatory Visit: Payer: 59 | Admitting: Internal Medicine

## 2023-02-23 ENCOUNTER — Ambulatory Visit: Payer: 59 | Admitting: Internal Medicine

## 2023-03-21 ENCOUNTER — Ambulatory Visit: Payer: 59 | Admitting: Internal Medicine

## 2023-03-23 ENCOUNTER — Ambulatory Visit: Payer: 59 | Admitting: Cardiology

## 2023-03-23 ENCOUNTER — Ambulatory Visit: Payer: 59 | Admitting: Internal Medicine

## 2023-03-25 ENCOUNTER — Other Ambulatory Visit: Payer: Self-pay | Admitting: Internal Medicine

## 2023-03-25 DIAGNOSIS — J449 Chronic obstructive pulmonary disease, unspecified: Secondary | ICD-10-CM

## 2023-04-11 ENCOUNTER — Ambulatory Visit: Payer: 59 | Admitting: Internal Medicine

## 2023-06-02 ENCOUNTER — Other Ambulatory Visit: Payer: Self-pay | Admitting: Internal Medicine

## 2023-06-02 DIAGNOSIS — J449 Chronic obstructive pulmonary disease, unspecified: Secondary | ICD-10-CM

## 2023-08-11 ENCOUNTER — Other Ambulatory Visit: Payer: Self-pay | Admitting: Internal Medicine

## 2023-08-11 DIAGNOSIS — J449 Chronic obstructive pulmonary disease, unspecified: Secondary | ICD-10-CM

## 2023-10-13 ENCOUNTER — Ambulatory Visit (HOSPITAL_COMMUNITY)
Admission: EM | Admit: 2023-10-13 | Discharge: 2023-10-13 | Disposition: A | Discharge status: Home / Self Care | Attending: Nurse Practitioner | Admitting: Nurse Practitioner

## 2023-10-13 DIAGNOSIS — F209 Schizophrenia, unspecified: Secondary | ICD-10-CM | POA: Insufficient documentation

## 2023-10-13 DIAGNOSIS — F4481 Dissociative identity disorder: Secondary | ICD-10-CM | POA: Insufficient documentation

## 2023-10-13 DIAGNOSIS — F3289 Other specified depressive episodes: Secondary | ICD-10-CM

## 2023-10-13 DIAGNOSIS — F32A Depression, unspecified: Secondary | ICD-10-CM | POA: Diagnosis not present

## 2023-10-13 DIAGNOSIS — Z5900 Homelessness unspecified: Secondary | ICD-10-CM | POA: Diagnosis not present

## 2023-10-13 DIAGNOSIS — F319 Bipolar disorder, unspecified: Secondary | ICD-10-CM | POA: Insufficient documentation

## 2023-10-13 DIAGNOSIS — F1290 Cannabis use, unspecified, uncomplicated: Secondary | ICD-10-CM | POA: Diagnosis not present

## 2023-10-13 DIAGNOSIS — F1994 Other psychoactive substance use, unspecified with psychoactive substance-induced mood disorder: Secondary | ICD-10-CM | POA: Diagnosis not present

## 2023-10-13 DIAGNOSIS — F1490 Cocaine use, unspecified, uncomplicated: Secondary | ICD-10-CM | POA: Diagnosis not present

## 2023-10-13 NOTE — Discharge Instructions (Addendum)

## 2023-10-13 NOTE — Progress Notes (Signed)
   10/13/23 1955  [facility] Triage Screening (Walk-ins at [facility] only)  How Did You Hear About Us ? Self  What Is the Reason for Your Visit/Call Today? Pt presents to [facility] as a voluntary walk-in, unaccompanied with with complaint of worsening depression and requesting substance abuse treatment. Pt reports using crack and cocaine about two hours prior to her arrival to [facility]. Pt reports that she has been using both substances daily since the loss of her mother last year. Pt also reports history of MDD. Pt denies taking prescribed medications at this time. Pt also denies being established with outpatient services. Pt currently denies SI,HI,AVH.  How Long Has This Been Causing You Problems? > than 6 months  Have You Recently Had Any Thoughts About Hurting Yourself? No  Are You Planning to Commit Suicide/Harm Yourself At This time? No  Have you Recently Had Thoughts About Hurting Someone Sherral? No  Are You Planning To Harm Someone At This Time? No  Physical Abuse Denies  Verbal Abuse Denies  Sexual Abuse Denies  Exploitation of patient/patient's resources Denies  Self-Neglect Denies  Are you currently experiencing any auditory, visual or other hallucinations? No  Have You Used Any Alcohol or Drugs in the Past 24 Hours? Yes  What Did You Use and How Much? crack and cocaine (unknown amount) about 2 hours before she arrived to [facility]  Do you have any current medical co-morbidities that require immediate attention? No  Clinician description of patient physical appearance/behavior: tearful, cooperative  What Do You Feel Would Help You the Most Today? Treatment for Depression or other mood problem;Alcohol or Drug Use Treatment;Medication(s)  If access to [facility] was not available, would you have sought care in the Emergency Department? Yes  Determination of Need Urgent (48 hours)  Options For Referral Other: Comment;Outpatient Therapy;Medication Management;Facility-Based Crisis;BH Urgent Care  Determination  of Need filed? Yes

## 2023-10-13 NOTE — ED Provider Notes (Cosign Needed)
 Behavioral Health Urgent Care Medical Screening Exam  Patient Name: Carol Henderson MRN: [id number] Date of Evaluation: 10/14/23 Chief Complaint:   I don't have nowhere to stay.  Diagnosis:  Final diagnoses:  Homelessness  Crack cocaine use  Substance induced mood disorder (HCC)  Other depression    History of Present illness: Carol Henderson is a 58 y.o. female. With psychiatric history of anxiety, bipolar 1, Multiple personality disorder and schizophrenia, who presented voluntarily as a walk in to [facility] with complaints of homelessness, depression and crack cocaine use.  Patient was seen face to face by this provider and chart reviewed.   Patient reports I'm homeless and I have a drug problem, crack cocaine, I've been using a long time, I use daily and last used today. Patient reports she also uses cocaine whenever she can get it and states I use more crack than cocaine and I last used cocaine more than 2 weeks ago. Patient reports being homeless for a few months, and staying with some friends, but they kicked her out today and she has nowhere to go so she came to the [facility] for shelter because she didn't have any other choice and states  she don't want me back at her house.  Patient reports being depressed because of this and also because her little dog of eight years got killed and I've been having problems with that.  Patient reports she is not established with an outpatient psychiatrist or therapist does not take any medications.  On evaluation, patient is alert, oriented x 4, and cooperative. Speech is clear and coherent. Pt appears casually dressed. Eye contact is good. Mood is anxious, affect is congruent with mood. Thought process is coherent and foal directed and thought content is WDL. Pt denies SI/HI/AVH. There is no objective indication that the patient is responding to internal stimuli. No delusions elicited during this assessment.    Discussed recommendation for  discharge with resources for area homeless shelters and outpatient psychiatric services for substance abuse. Patient reports she has a sister and a daughter who live in town and is unsure if there will let her stay with them.  She reports she would try giving them a call for pick up, otherwise she is willing to accept a transport ticket/voucher to her daughter's address.  Patient is provided with opportunity for questions.  She verbalized understanding and is in agreement.   Flowsheet Row ED from 10/13/2023 in [facility] ED from 08/27/2022 in [facility] Emergency Department at [facility] ED from 06/11/2020 in [facility] Emergency Department at [facility]  C-SSRS RISK CATEGORY Moderate Risk No Risk Error: Question 1 not populated    Psychiatric Specialty Exam  Presentation  General Appearance:Casual  Eye Contact:Good  Speech:Clear and Coherent  Speech Volume:Normal  Handedness:Right   Mood and Affect  Mood: Anxious  Affect: Congruent   Thought Process  Thought Processes: Coherent; Goal Directed  Descriptions of Associations:Intact  Orientation:Full (Time, Place and Person)  Thought Content:WDL  Diagnosis of Schizophrenia or Schizoaffective disorder in past: Yes   Hallucinations:None  Ideas of Reference:None  Suicidal Thoughts:No  Homicidal Thoughts:No   Sensorium  Memory: Immediate Fair  Judgment: Intact  Insight: Present   Executive Functions  Concentration: Good  Attention Span: Good  Recall: Fair  Fund of Knowledge: Fair  Language: Good   Psychomotor Activity  Psychomotor Activity: Normal   Assets  Assets: Communication Skills; Desire for Improvement   Sleep  Sleep: Fair  Number  of hours: No data recorded  Physical Exam: Physical Exam Constitutional:      General: She is not in acute distress.    Appearance: She is not diaphoretic.  HENT:     Nose: No congestion.   Cardiovascular:     Rate and Rhythm: Normal rate.  Pulmonary:     Effort: No respiratory distress.  Chest:     Chest wall: No tenderness.  Neurological:     Mental Status: She is alert and oriented to person, place, and time.  Psychiatric:        Attention and Perception: Attention and perception normal.        Mood and Affect: Mood is depressed.        Speech: Speech normal.        Behavior: Behavior is cooperative.        Thought Content: Thought content normal.        Cognition and Memory: Cognition and memory normal.    Review of Systems  Constitutional:  Negative for chills, diaphoresis and fever.  HENT:  Negative for congestion.   Eyes:  Negative for discharge.  Respiratory:  Negative for cough, shortness of breath and wheezing.   Cardiovascular:  Negative for chest pain and palpitations.  Gastrointestinal:  Negative for diarrhea, nausea and vomiting.  Neurological:  Negative for dizziness, seizures, loss of consciousness, weakness and headaches.  Psychiatric/Behavioral:  Positive for depression and substance abuse.    Blood pressure (!) 163/102, pulse 81, temperature 98.5 F (36.9 C), temperature source Oral, resp. rate 20, SpO2 96%. There is no height or weight on file to calculate BMI.  Musculoskeletal: Strength & Muscle Tone: within normal limits Gait & Station: normal Patient leans: N/A   [facility] MSE Discharge Disposition for Follow up and Recommendations: Based on my evaluation the patient does not appear to have an emergency medical condition and can be discharged with resources and follow up care in outpatient services for Substance Abuse Intensive Outpatient Program and area homeless resources  Recommend discharge and follow-up with resources for area homeless shelters and outpatient substance abuse resources.  Patient denies SI/HI/AVH or paranoia.  Patient does not meet inpatient psychiatric admission criteria or IVC criteria at this time.  There is no evidence  of imminent risk of harm to self or others.  Discharge recommendations:  Please follow up with your primary care provider for all medical related needs.   Therapy: We recommend that patient participate in individual therapy to address mental health concerns.  Safety:  The patient should abstain from use of illicit substances/drugs and abuse of any medications. If symptoms worsen or do not continue to improve or if the patient becomes actively suicidal or homicidal then it is recommended that the patient return to the closest hospital emergency department, the [facility], or call 911 for further evaluation and treatment. National Suicide Prevention Lifeline 1-800-SUICIDE or 951-354-5568.  About 988 988 offers 24/7 access to trained crisis counselors who can help people experiencing mental health-related distress. People can call or text 988 or chat 988lifeline.org for themselves or if they are worried about a loved one who may need crisis support.  Crisis Mobile: [facility]:                     (347)085-5468 (for crisis response 24 hours a day) [facility] Hotline:  626-734-2823   Patient discharged with resources in stable condition.  Thurman LULLA Ivans, NP 10/14/2023, 12:15 AM

## 2023-10-13 NOTE — BH Assessment (Signed)
 Comprehensive Clinical Assessment (CCA) Note  10/13/2023 Carol Henderson [id number]  DISPOSITION: Pending NP assessment  The patient demonstrates the following risk factors for suicide: Chronic risk factors for suicide include: psychiatric disorder of MDD and BPD and substance use disorder. Acute risk factors for suicide include: family or marital conflict, unemployment, social withdrawal/isolation, and loss (financial, interpersonal, professional). Protective factors for this patient include: hope for the future. Considering these factors, the overall suicide risk at this point appears to be moderate. Patient is appropriate for outpatient follow up.   Per Triage assessment: "Pt presents to [facility] as a voluntary walk-in, unaccompanied with with complaint of worsening depression and requesting substance abuse treatment. Pt reports using crack and cocaine about two hours prior to her arrival to [facility]. Pt reports that she has been using both substances daily since the loss of her mother last year. Pt also reports history of MDD. Pt denies taking prescribed medications at this time. Pt also denies being established with outpatient services. Pt currently denies SI,HI,AVH."  With further assessment: Pt is a 58 yo female who presented voluntarily and unaccompanied requesting detox from Crack and powder cocaine and cannabis. Pt reported that she uses all 3 daily with her last use a few hours prior to coming to the [facility]. Pt stated that if she does not use she begins to have withdrawal sx such as getting agitated, sweating excessively, nausea/vomiting and fidgetiness. Pt stated that she was able to stop drinking alcohol and now she wants to stop the other substance use. Hx of multiple medical issues including CHF and chronic back pain.  Pt denied SI, HI, current NSSH, AVH and paranoia. 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 Pt stated her triggers include the death of her mother last year and the death of her dog 09-Sep-2023.   Pt stated that she lives with friends, receives disability income due to her mental health conditions and has her sister as her payee. Pt stated that she has 2 adult daughters. Pt stated that she is not close to them despite their living close by. Pt denied any current outstanding legal issues and denied access to firearms.    Chief Complaint:  Chief Complaint  Patient presents with   Depression   Addiction Problem   Visit Diagnosis:  Stimulant Use d/o, Cocaine Cannabis Use d/o    CCA Screening, Triage and Referral (STR)  Patient Reported Information How did you hear about us ? Self  What Is the Reason for Your Visit/Call Today? Pt presents to [facility] as a voluntary walk-in, unaccompanied with with complaint of worsening depression and requesting substance abuse treatment. Pt reports using crack and cocaine about two hours prior to her arrival to [facility]. Pt reports that she has been using both substances daily since the loss of her mother last year. Pt also reports history of MDD. Pt denies taking prescribed medications at this time. Pt also denies being established with outpatient services. Pt currently denies SI,HI,AVH.  How Long Has This Been Causing You Problems? > than 6 months  What Do You Feel Would Help You the Most Today? Treatment for Depression or other mood problem; Alcohol or Drug Use Treatment; Medication(s)   Have You Recently Had Any Thoughts About Hurting Yourself? No  Are You Planning to Commit  Suicide/Harm Yourself At This time? No   Flowsheet Row ED from 10/13/2023 in [facility] ED from 08/27/2022 in [facility] Emergency Department at [facility] from 06/11/2020 in [facility] Emergency Department  at [facility]  C-SSRS RISK CATEGORY Moderate Risk No Risk Error: Question 1 not populated    Have you Recently Had Thoughts About Hurting Someone [place]? No  Are You Planning to Harm Someone at This Time? No  Explanation: na  Have You Used Any Alcohol or Drugs in the Past 24 Hours? Yes  How Long Ago Did You Use Drugs or Alcohol? today What Did You Use and How Much? crack and cocaine (unknown amount) about 2 hours before she arrived to [facility]   Do You Currently Have a Therapist/Psychiatrist? No  Name of Therapist/Psychiatrist:    Have You Been Recently Discharged From Any Office Practice or Programs? No  Explanation of Discharge From Practice/Program: na    CCA Screening Triage Referral Assessment Type of Contact: Face-to-Face  Telemedicine Service Delivery:   Is this Initial or Reassessment?   Date Telepsych consult ordered in [facility]:    Time Telepsych consult ordered in [facility]:    Location of Assessment: [facility] Assessment Services  Provider Location: [facility] Assessment Services   Collateral Involvement: none   Does Patient Have a Automotive engineer Guardian? No  Legal Guardian Contact Information: na  Copy of Legal Guardianship Form: -- (na)  Legal Guardian Notified of Arrival: -- (na)  Legal Guardian Notified of Pending Discharge: -- (na)  If Minor and Not Living with Parent(s), Who has Custody? adult  Is CPS involved or ever been involved? Never (none reported)  Is APS involved or ever been involved? Never   Patient Determined To Be At Risk for Harm To Self or Others Based on Review of Patient Reported Information or Presenting Complaint? No  Method: No Plan  Availability of Means: No access or NA  Intent: Vague intent or NA  Notification Required: No need or identified person  Additional Information for Danger to Others Potential: -- (na)  Additional Comments for Danger to Others Potential: none  Are There Guns or Other Weapons in Your Home?  No  Types of Guns/Weapons: na  Are These Weapons Safely Secured?                            No  Who Could Verify You Are Able To Have These Secured: na  Do You Have any Outstanding Charges, Pending Court Dates, Parole/Probation? none-pt denied  Contacted To Inform of Risk of Harm To Self or Others: -- (na)    Does Patient Present under Involuntary Commitment? No    [place] of Residence: [place]   Patient Currently Receiving the Following Services: Not Receiving Services   Determination of Need: Urgent (48 hours)   Options For Referral: Facility-Based Crisis     CCA Biopsychosocial Patient Reported Schizophrenia/Schizoaffective Diagnosis in Past: Yes   Strengths: able to ask for help  Mental Health Symptoms Depression:  Fatigue; Hopelessness; Tearfulness; Worthlessness   Duration of Depressive symptoms: Duration of Depressive Symptoms: Greater than two weeks   Mania:  Racing thoughts; Increased Energy   Anxiety:   Worrying   Psychosis:  None   Duration of Psychotic symptoms:    Trauma:  Avoids reminders of event   Obsessions:  None   Compulsions:  None   Inattention:  N/A   Hyperactivity/Impulsivity:  N/A  Oppositional/Defiant Behaviors:  None   Emotional Irregularity:  None   Other Mood/Personality Symptoms:  none    Mental Status Exam Appearance and self-care  Stature:  Average   Weight:  Average weight   Clothing:  Casual; Disheveled   Grooming:  Neglected   Cosmetic use:  Age appropriate   Posture/gait:  Normal   Motor activity:  Not Remarkable   Sensorium  Attention:  Normal   Concentration:  Normal   Orientation:  X5   Recall/memory:  Normal   Affect and Mood  Affect:  Depressed   Mood:  Depressed; Dysphoric   Relating  Eye contact:  Normal   Facial expression:  Constricted; Depressed   Attitude toward examiner:  Cooperative   Thought and Language  Speech flow: Paucity; Clear and Coherent   Thought  content:  Appropriate to Mood and Circumstances   Preoccupation:  None   Hallucinations:  None   Organization:  Intact   [employer] of Knowledge:  Average   Intelligence:  Average   Abstraction:  Functional   Judgement:  Poor   Reality Testing:  Adequate   Insight:  Lacking; Gaps; Flashes of insight   Decision Making:  Impulsive   Social Functioning  Social Maturity:  Impulsive   Social Judgement:  Heedless   Stress  Stressors:  Family conflict; Grief/losses; Housing; Surveyor, quantity; Illness   Coping Ability:  Normal   Skill Deficits:  Self-care; Self-control; Responsibility   Supports:  Support needed     Religion: Religion/Spirituality Are You A Religious Person?: Yes What is Your Religious Affiliation?: Baptist How Might This Affect Treatment?: unknown  Leisure/Recreation: Leisure / Recreation Do You Have Hobbies?: No  Exercise/Diet: Exercise/Diet Do You Exercise?: Yes What Type of Exercise Do You Do?: Run/Walk How Many Times a Week Do You Exercise?: 6-7 times a week Have You Gained or Lost A Significant Amount of Weight in the Past Six Months?: No Do You Follow a Special Diet?: No Do You Have Any Trouble Sleeping?: Yes Explanation of Sleeping Difficulties: Pt stated that she has trouble sleeping when she uses cocaine and sleeps better after using cannabis.   CCA Employment/Education Employment/Work Situation: Employment / Work Systems developer: On disability Why is Patient on Disability: mental health How Long has Patient Been on Disability: many years Patient's Job has Been Impacted by Current Illness:  (na) Has Patient ever Been in the [employer]?: No  Education: Education Is Patient Currently Attending School?: No Last Grade Completed: 8 Did You Attend College?: No Did You Have An Individualized Education Program (IIEP): No Did You Have Any Difficulty At School?: No Patient's Education Has Been Impacted by  Current Illness: No   CCA Family/Childhood History Family and Relationship History: Family history Marital status: Divorced Divorced, when?: many years ago What types of issues is patient dealing with in the relationship?: pt did not disclose Additional relationship information: na Does patient have children?: Yes How many children?: 2 (adult daughters) How is patient's relationship with their children?: distant  Childhood History:  Childhood History By whom was/is the patient raised?: Mother Did patient suffer any verbal/emotional/physical/sexual abuse as a child?: No Did patient suffer from severe childhood neglect?: No Has patient ever been sexually abused/assaulted/raped as an adolescent or adult?: No Was the patient ever a victim of a crime or a disaster?: No Witnessed domestic violence?: No Has patient been affected by domestic violence as an adult?: No       CCA Substance Use Alcohol/Drug Use:  Alcohol / Drug Use Pain Medications: see MAR Prescriptions: see MAR Over the Counter: see MAR History of alcohol / drug use?: Yes Longest period of sobriety (when/how long): I don't remember Negative Consequences of Use: Personal relationships, Financial Withdrawal Symptoms: Nausea / Vomiting, Irritability, Sweats Substance #1 Name of Substance 1: cocaine (crack and powder) 1 - Age of First Use: 20s 1 - Amount (size/oz): varies 1 - Frequency: daily 1 - Duration: ongoing 1 - Last Use / Amount: 2 hours prior to coming to [facility] 1 - Method of Aquiring: unknown 1- Route of Use: snort Substance #2 Name of Substance 2: cannabis 2 - Age of First Use: 20s 2 - Amount (size/oz): varies 2 - Frequency: daily 2 - Duration: ongoing 2 - Last Use / Amount: today 2 - Method of Aquiring: unknown 2 - Route of Substance Use: smoke                     ASAM's:  Six Dimensions of Multidimensional Assessment  Dimension 1:  Acute Intoxication and/or Withdrawal Potential:       Dimension 2:  Biomedical Conditions and Complications:   Dimension 2:  Description of patient's biomedical conditions and  complications: CHF and Chronic back issues  Dimension 3:  Emotional, Behavioral, or Cognitive Conditions and Complications:  Dimension 3:  Description of emotional, behavioral, or cognitive conditions and complications: Hx of MDD and BPD per chart, Pt reported Schizophrenia, Bipolar d/o and DID  Dimension 4:  Readiness to Change:     Dimension 5:  Relapse, Continued use, or Continued Problem Potential:     Dimension 6:  Recovery/Living Environment:     ASAM Severity Score:    ASAM Recommended Level of Treatment: ASAM Recommended Level of Treatment: Level II Partial Hospitalization Treatment   Substance use Disorder (SUD) Substance Use Disorder (SUD)  Checklist Symptoms of Substance Use: Continued use despite having a persistent/recurrent physical/psychological problem caused/exacerbated by use, Continued use despite persistent or recurrent social, interpersonal problems, caused or exacerbated by use, Large amounts of time spent to obtain, use or recover from the substance(s), Recurrent use that results in a failure to fulfill major role obligations (work, school, home)  Recommendations for Services/Supports/Treatments: Recommendations for Services/Supports/Treatments Recommendations For Services/Supports/Treatments: Detox, Insurance claims handler  Disposition Recommendation per psychiatric provider: We recommend transfer to [facility]. Recommendation pending NP assessment.    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   Right wrist pain 05/05/2016   Benign neoplasm of ascending colon    Special screening for malignant neoplasms, colon    First degree hemorrhoids    Angina at rest [facility]) 07/16/2015   Sacral insufficiency fracture 07/08/2015   COPD (chronic obstructive pulmonary disease) (HCC) 07/08/2015   Left-sided chest pain 07/08/2015   Blood per rectum 06/18/2015   Lower abdominal pain 06/18/2015   Affective bipolar disorder (HCC) 02/12/2015   Breast pain 02/12/2015   CAFL (chronic airflow limitation) (HCC) 02/12/2015   Epigastric pain 02/05/2015   Gallstones 02/05/2015   Gallstones without obstruction of gallbladder 01/30/2015   Epigastric  abdominal pain 01/21/2015   Abnormal finding on EKG 01/21/2015   Epigastric abdominal pain 01/21/2015   Abdominal pain, acute, epigastric 12/31/2014   Abnormal toxicological findings 09/20/2014   Positive urine drug screen 09/20/2014   Continuous opioid dependence (HCC) 06/20/2014   Hypomagnesemia 08/18/2013   Arterial blood pressure decreased 08/18/2013   Hypotension 08/18/2013   Syncope 08/18/2013   Decreased potassium in the blood 07/09/2013   Pain of metastatic malignancy 07/03/2013   Chemotherapy induced nausea and vomiting 06/19/2013   History of cancer of vulva 05/27/2013   Vulva cancer (HCC) 05/27/2013   HEPATITIS C 11/30/2006   ABUSE, OTHER/MIXED/UNSPECIFIED DRUG, EPISODIC 11/30/2006   Essential hypertension 11/30/2006   CORONARY ARTERY DISEASE 11/30/2006   Peptic ulcer 11/30/2006   BORDERLINE PERSONALITY 06/02/2006   TOBACCO DEPENDENCE 06/02/2006   HYPERTENSION, BENIGN SYSTEMIC 06/02/2006   Asthma, mild intermittent, well-controlled 06/02/2006   GASTROESOPHAGEAL REFLUX, NO ESOPHAGITIS 06/02/2006     Referrals to Alternative Service(s): Referred to Alternative Service(s):   Place:   Date:   Time:    Referred to Alternative  Service(s):   Place:   Date:   Time:    Referred to Alternative Service(s):   Place:   Date:   Time:    Referred to Alternative Service(s):   Place:   Date:   Time:     Kalana Yust T, Counselor

## 2023-10-21 ENCOUNTER — Encounter: Payer: Self-pay | Admitting: Internal Medicine

## 2023-10-21 ENCOUNTER — Ambulatory Visit: Payer: Self-pay | Admitting: Internal Medicine

## 2023-10-21 ENCOUNTER — Ambulatory Visit (INDEPENDENT_AMBULATORY_CARE_PROVIDER_SITE_OTHER): Admitting: Internal Medicine

## 2023-10-21 VITALS — BP 158/102 | HR 92 | Ht 62.5 in | Wt 131.4 lb

## 2023-10-21 DIAGNOSIS — F17200 Nicotine dependence, unspecified, uncomplicated: Secondary | ICD-10-CM

## 2023-10-21 DIAGNOSIS — R8290 Unspecified abnormal findings in urine: Secondary | ICD-10-CM | POA: Diagnosis not present

## 2023-10-21 DIAGNOSIS — R1013 Epigastric pain: Secondary | ICD-10-CM

## 2023-10-21 DIAGNOSIS — J449 Chronic obstructive pulmonary disease, unspecified: Secondary | ICD-10-CM

## 2023-10-21 DIAGNOSIS — F411 Generalized anxiety disorder: Secondary | ICD-10-CM

## 2023-10-21 DIAGNOSIS — K219 Gastro-esophageal reflux disease without esophagitis: Secondary | ICD-10-CM

## 2023-10-21 DIAGNOSIS — C801 Malignant (primary) neoplasm, unspecified: Secondary | ICD-10-CM

## 2023-10-21 DIAGNOSIS — E782 Mixed hyperlipidemia: Secondary | ICD-10-CM

## 2023-10-21 DIAGNOSIS — I10 Essential (primary) hypertension: Secondary | ICD-10-CM

## 2023-10-21 DIAGNOSIS — R110 Nausea: Secondary | ICD-10-CM

## 2023-10-21 DIAGNOSIS — G63 Polyneuropathy in diseases classified elsewhere: Secondary | ICD-10-CM

## 2023-10-21 DIAGNOSIS — R0789 Other chest pain: Secondary | ICD-10-CM

## 2023-10-21 DIAGNOSIS — N3000 Acute cystitis without hematuria: Secondary | ICD-10-CM

## 2023-10-21 LAB — POCT URINALYSIS DIPSTICK
Bilirubin, UA: NEGATIVE
Glucose, UA: NEGATIVE
Ketones, UA: NEGATIVE
Nitrite, UA: NEGATIVE
Protein, UA: NEGATIVE
Spec Grav, UA: 1.015 (ref 1.010–1.025)
Urobilinogen, UA: 0.2 U/dL
pH, UA: 6 (ref 5.0–8.0)

## 2023-10-21 MED ORDER — ONDANSETRON 4 MG PO TBDP
4.0000 mg | ORAL_TABLET | Freq: Three times a day (TID) | ORAL | 0 refills | Status: AC | PRN
Start: 2023-10-21 — End: ?

## 2023-10-21 MED ORDER — GABAPENTIN 400 MG PO CAPS: 400.0000 mg | ORAL_CAPSULE | Freq: Three times a day (TID) | ORAL | 1 refills | Status: DC

## 2023-10-21 MED ORDER — CARVEDILOL 3.125 MG PO TABS: 3.1250 mg | ORAL_TABLET | Freq: Two times a day (BID) | ORAL | 0 refills | Status: DC

## 2023-10-21 MED ORDER — CIPROFLOXACIN HCL 500 MG PO TABS: 500.0000 mg | ORAL_TABLET | Freq: Two times a day (BID) | ORAL | 0 refills | Status: AC

## 2023-10-21 MED ORDER — PANTOPRAZOLE SODIUM 40 MG PO TBEC: 40.0000 mg | DELAYED_RELEASE_TABLET | Freq: Every day | ORAL | 3 refills | Status: DC

## 2023-10-21 MED ORDER — ASPIRIN EC 81 MG PO TBEC: 81.0000 mg | DELAYED_RELEASE_TABLET | Freq: Every day | ORAL | 3 refills | Status: DC

## 2023-10-21 MED ORDER — TIOTROPIUM BROMIDE MONOHYDRATE 18 MCG IN CAPS
18.0000 ug | ORAL_CAPSULE | Freq: Every day | RESPIRATORY_TRACT | 0 refills | Status: DC
Start: 2023-10-21 — End: 2023-11-25

## 2023-10-21 MED ORDER — ATORVASTATIN CALCIUM 80 MG PO TABS
80.0000 mg | ORAL_TABLET | Freq: Every day | ORAL | 0 refills | Status: DC
Start: 2023-10-21 — End: 2023-12-20

## 2023-10-21 MED ORDER — NITROGLYCERIN 0.4 MG SL SUBL
0.4000 mg | SUBLINGUAL_TABLET | SUBLINGUAL | 3 refills | Status: DC | PRN
Start: 2023-10-21 — End: 2023-10-21

## 2023-10-21 MED ORDER — CLONAZEPAM 1 MG PO TABS: ORAL_TABLET | ORAL | 0 refills | Status: DC

## 2023-10-21 MED ORDER — ALBUTEROL SULFATE HFA 108 (90 BASE) MCG/ACT IN AERS: 2.0000 | INHALATION_SPRAY | Freq: Four times a day (QID) | RESPIRATORY_TRACT | 2 refills | Status: DC | PRN

## 2023-10-21 MED ORDER — NITROGLYCERIN 0.4 MG SL SUBL: 0.4000 mg | SUBLINGUAL_TABLET | SUBLINGUAL | 3 refills | Status: AC | PRN

## 2023-10-21 MED ORDER — LOSARTAN POTASSIUM 100 MG PO TABS: 100.0000 mg | ORAL_TABLET | Freq: Every day | ORAL | 3 refills | Status: DC

## 2023-10-21 MED ORDER — AMLODIPINE BESYLATE 10 MG PO TABS: 10.0000 mg | ORAL_TABLET | Freq: Every day | ORAL | 2 refills | Status: DC
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
## 2023-10-22 LAB — LIPID PANEL W/O CHOL/HDL RATIO

## 2023-10-22 LAB — CBC WITH DIFFERENTIAL/PLATELET
Basophils Absolute: 0.1 x10E3/uL (ref 0.0–0.2)
Basos: 1 %
EOS (ABSOLUTE): 0.2 x10E3/uL (ref 0.0–0.4)
Eos: 2 %
Hematocrit: 46.3 % (ref 34.0–46.6)
Hemoglobin: 15 g/dL (ref 11.1–15.9)
Immature Grans (Abs): 0.1 x10E3/uL (ref 0.0–0.1)
Immature Granulocytes: 1 %
Lymphocytes Absolute: 2.3 x10E3/uL (ref 0.7–3.1)
Lymphs: 23 %
MCH: 29.9 pg (ref 26.6–33.0)
MCHC: 32.4 g/dL (ref 31.5–35.7)
MCV: 92 fL (ref 79–97)
Monocytes Absolute: 0.6 x10E3/uL (ref 0.1–0.9)
Monocytes: 6 %
Neutrophils Absolute: 6.7 x10E3/uL (ref 1.4–7.0)
Neutrophils: 67 %
Platelets: 360 x10E3/uL (ref 150–450)
RBC: 5.02 x10E6/uL (ref 3.77–5.28)
RDW: 13 % (ref 11.7–15.4)
WBC: 9.9 x10E3/uL (ref 3.4–10.8)

## 2023-10-22 LAB — CMP14+EGFR

## 2023-10-22 LAB — LIPASE

## 2023-10-24 ENCOUNTER — Telehealth: Payer: Self-pay

## 2023-10-24 ENCOUNTER — Other Ambulatory Visit: Payer: Self-pay

## 2023-10-24 DIAGNOSIS — I10 Essential (primary) hypertension: Secondary | ICD-10-CM

## 2023-10-24 DIAGNOSIS — E782 Mixed hyperlipidemia: Secondary | ICD-10-CM

## 2023-10-24 DIAGNOSIS — R1013 Epigastric pain: Secondary | ICD-10-CM

## 2023-10-24 DIAGNOSIS — R7301 Impaired fasting glucose: Secondary | ICD-10-CM

## 2023-10-24 NOTE — Progress Notes (Signed)
 Sent msg to the front desk to have pt come back to get labs done

## 2023-10-24 NOTE — Telephone Encounter (Signed)
 Dee wanted me to let you know that the patients labs some didn't result, the only thing that came back was CBC, all the rest there wasn't enough blood, do you want patient come back and get labs redone or wait until her next draw?

## 2023-10-24 NOTE — Telephone Encounter (Signed)
 Order has been replaced for pt to come get labs done

## 2023-11-07 ENCOUNTER — Ambulatory Visit: Admitting: Internal Medicine

## 2023-11-09 ENCOUNTER — Ambulatory Visit: Payer: Self-pay | Admitting: Surgery
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
## 2023-11-10 ENCOUNTER — Encounter: Admission: RE | Payer: Self-pay | Source: Home / Self Care

## 2023-11-10 ENCOUNTER — Ambulatory Visit: Admission: RE | Admit: 2023-11-10 | Source: Home / Self Care | Admitting: Surgery

## 2023-11-10 SURGERY — EXCISION, LESION, SCALP
Anesthesia: General | Site: Scalp

## 2023-11-21 ENCOUNTER — Other Ambulatory Visit: Payer: Self-pay

## 2023-11-21 ENCOUNTER — Encounter
Admission: RE | Admit: 2023-11-21 | Discharge: 2023-11-21 | Disposition: A | Discharge status: Home / Self Care | Source: Ambulatory Visit | Attending: Surgery | Admitting: Surgery

## 2023-11-21 NOTE — Patient Instructions (Signed)
 Your procedure is scheduled on:  FRIDAY AUGUST 22 Report to the Registration Desk on the 1st floor of the [facility] To find out your arrival time, please call [phone] between 1PM - 3PM on:  FRIDAY  AUGUST 22  If your arrival time is 6:00 am, do not arrive before that time as the [facility] entrance doors do not open until 6:00 am.  REMEMBER: Instructions that are not followed completely may result in serious medical risk, up to and including death; or upon the discretion of your surgeon and anesthesiologist your surgery may need to be rescheduled.  Do not eat food after midnight the night before surgery.  No gum chewing or hard candies.  You may however, drink CLEAR liquids up to 2 hours before you are scheduled to arrive for your surgery. Do not drink anything within 2 hours of your scheduled arrival time.  Clear liquids include: - water  - apple juice without pulp - gatorade (not RED colors) - black coffee or tea (Do NOT add milk or creamers to the coffee or tea) Do NOT drink anything that is not on this list.  One week prior to surgery: Stop Anti-inflammatories (NSAIDS) such as Advil , Aleve, Ibuprofen , Motrin , Naproxen, Naprosyn and Aspirin  based products such as Excedrin, Goody's Powder, BC Powder. Stop ANY OVER THE COUNTER supplements until after surgery.  You may however, continue to take Tylenol  if needed for pain up until the day of surgery.  Continue taking all of your other prescription medications up until the day of surgery.  ON THE DAY OF SURGERY ONLY TAKE THESE MEDICATIONS WITH SIPS OF WATER:  carvedilol  (COREG )  pantoprazole  (PROTONIX )   Use inhalers on the day of surgery and bring to the hospital. albuterol  (VENTOLIN  HFA)   No Alcohol for 24 hours before or after surgery.  No Smoking including e-cigarettes for 24 hours before surgery.  No chewable tobacco products for at least 6 hours before surgery.  No nicotine  patches on the day of  surgery.  Do not use any recreational drugs for at least a week (preferably 2 weeks) before your surgery.  Please be advised that the combination of cocaine and anesthesia may have negative outcomes, up to and including death. If you test positive for cocaine, your surgery will be cancelled.  On the morning of surgery brush your teeth with toothpaste and water, you may rinse your mouth with mouthwash if you wish. Do not swallow any toothpaste or mouthwash.  Do not wear jewelry, make-up, hairpins, clips or nail polish.  For welded (permanent) jewelry: bracelets, anklets, waist bands, etc.  Please have this removed prior to surgery.  If it is not removed, there is a chance that hospital personnel will need to cut it off on the day of surgery.  Do not wear lotions, powders, or perfumes.   Do not shave body hair from the neck down 48 hours before surgery.  Contact lenses, hearing aids and dentures may not be worn into surgery.  Do not bring valuables to the hospital. [facility] is not responsible for any missing/lost belongings or valuables.   Notify your doctor if there is any change in your medical condition (cold, fever, infection).  Wear comfortable clothing (specific to your surgery type) to the hospital.  After surgery, you can help prevent lung complications by doing breathing exercises.  Take deep breaths and cough every 1-2 hours.   If you are being discharged the day of surgery, you will not be allowed  to drive home. You will need a responsible individual to drive you home and stay with you for 24 hours after surgery.   If you are taking public transportation, you will need to have a responsible individual with you.  Please call the Pre-admissions Testing Dept. at [phone] if you have any questions about these instructions.  Surgery Visitation Policy:  Patients having surgery or a procedure may have two visitors.  Children under the age of 29 must have an adult  with them who is not the patient.  Merchandiser, retail to address health-related social needs:  [email or web address]

## 2023-11-24 ENCOUNTER — Encounter: Payer: Self-pay | Admitting: Surgery
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
## 2023-11-25 ENCOUNTER — Other Ambulatory Visit: Payer: Self-pay

## 2023-11-25 ENCOUNTER — Encounter: Payer: Self-pay | Admitting: Surgery

## 2023-11-25 ENCOUNTER — Encounter
Admission: RE | Disposition: A | Discharge status: Home / Self Care | Payer: Self-pay | Source: Home / Self Care | Attending: Surgery

## 2023-11-25 ENCOUNTER — Ambulatory Visit
Admission: RE | Admit: 2023-11-25 | Discharge: 2023-11-25 | Disposition: A | Discharge status: Home / Self Care | Attending: Surgery | Admitting: Surgery

## 2023-11-25 ENCOUNTER — Ambulatory Visit: Admitting: Urgent Care

## 2023-11-25 DIAGNOSIS — Z5900 Homelessness unspecified: Secondary | ICD-10-CM | POA: Insufficient documentation

## 2023-11-25 DIAGNOSIS — J449 Chronic obstructive pulmonary disease, unspecified: Secondary | ICD-10-CM | POA: Insufficient documentation

## 2023-11-25 DIAGNOSIS — I252 Old myocardial infarction: Secondary | ICD-10-CM | POA: Insufficient documentation

## 2023-11-25 DIAGNOSIS — F1410 Cocaine abuse, uncomplicated: Secondary | ICD-10-CM | POA: Insufficient documentation

## 2023-11-25 DIAGNOSIS — I739 Peripheral vascular disease, unspecified: Secondary | ICD-10-CM | POA: Diagnosis not present

## 2023-11-25 DIAGNOSIS — Z7982 Long term (current) use of aspirin: Secondary | ICD-10-CM | POA: Insufficient documentation

## 2023-11-25 DIAGNOSIS — F319 Bipolar disorder, unspecified: Secondary | ICD-10-CM | POA: Insufficient documentation

## 2023-11-25 DIAGNOSIS — F17210 Nicotine dependence, cigarettes, uncomplicated: Secondary | ICD-10-CM | POA: Diagnosis not present

## 2023-11-25 DIAGNOSIS — F209 Schizophrenia, unspecified: Secondary | ICD-10-CM | POA: Diagnosis not present

## 2023-11-25 DIAGNOSIS — E785 Hyperlipidemia, unspecified: Secondary | ICD-10-CM | POA: Insufficient documentation

## 2023-11-25 DIAGNOSIS — F419 Anxiety disorder, unspecified: Secondary | ICD-10-CM | POA: Diagnosis not present

## 2023-11-25 DIAGNOSIS — Z79899 Other long term (current) drug therapy: Secondary | ICD-10-CM | POA: Insufficient documentation

## 2023-11-25 DIAGNOSIS — Z01812 Encounter for preprocedural laboratory examination: Secondary | ICD-10-CM

## 2023-11-25 DIAGNOSIS — I10 Essential (primary) hypertension: Secondary | ICD-10-CM | POA: Insufficient documentation

## 2023-11-25 DIAGNOSIS — K219 Gastro-esophageal reflux disease without esophagitis: Secondary | ICD-10-CM | POA: Diagnosis not present

## 2023-11-25 DIAGNOSIS — L7211 Pilar cyst: Secondary | ICD-10-CM | POA: Diagnosis not present

## 2023-11-25 DIAGNOSIS — L729 Follicular cyst of the skin and subcutaneous tissue, unspecified: Secondary | ICD-10-CM

## 2023-11-25 DIAGNOSIS — I2510 Atherosclerotic heart disease of native coronary artery without angina pectoris: Secondary | ICD-10-CM | POA: Diagnosis not present

## 2023-11-25 DIAGNOSIS — E876 Hypokalemia: Secondary | ICD-10-CM

## 2023-11-25 DIAGNOSIS — D234 Other benign neoplasm of skin of scalp and neck: Secondary | ICD-10-CM

## 2023-11-25 HISTORY — DX: Cardiac murmur, unspecified: R01.1

## 2023-11-25 HISTORY — DX: Hyperlipidemia, unspecified: E78.5

## 2023-11-25 HISTORY — DX: Depression, unspecified: F32.A

## 2023-11-25 HISTORY — DX: Other chest pain: R07.89

## 2023-11-25 HISTORY — DX: Homelessness unspecified: Z59.00

## 2023-11-25 HISTORY — DX: Adverse effect of antineoplastic and immunosuppressive drugs, initial encounter: R11.2

## 2023-11-25 HISTORY — DX: Other ill-defined heart diseases: I51.89

## 2023-11-25 HISTORY — DX: Peripheral vascular disease, unspecified: I73.9

## 2023-11-25 HISTORY — DX: Other psychoactive substance abuse, uncomplicated: F19.10

## 2023-11-25 HISTORY — DX: Other benign neoplasm of skin of scalp and neck: D23.4

## 2023-11-25 HISTORY — DX: Syncope and collapse: R55

## 2023-11-25 HISTORY — DX: Other psychoactive substance use, unspecified with psychoactive substance-induced mood disorder: F19.94

## 2023-11-25 HISTORY — PX: LESION EXCISION: SHX5167

## 2023-11-25 HISTORY — DX: Atherosclerotic heart disease of native coronary artery without angina pectoris: I25.10

## 2023-11-25 HISTORY — DX: Calculus of kidney: N20.0

## 2023-11-25 HISTORY — DX: Unspecified right bundle-branch block: I45.10

## 2023-11-25 HISTORY — DX: Adverse effect of antineoplastic and immunosuppressive drugs, initial encounter: T45.1X5A

## 2023-11-25 HISTORY — DX: Atherosclerosis of aorta: I70.0

## 2023-11-25 HISTORY — DX: Nausea with vomiting, unspecified: R11.2

## 2023-11-25 LAB — BASIC METABOLIC PANEL WITH GFR
Anion gap: 7 (ref 5–15)
BUN: 26 mg/dL — ABNORMAL HIGH (ref 6–20)
CO2: 27 mmol/L (ref 22–32)
Calcium: 9.2 mg/dL (ref 8.9–10.3)
Chloride: 107 mmol/L (ref 98–111)
Creatinine, Ser: 1.12 mg/dL — ABNORMAL HIGH (ref 0.44–1.00)
GFR, Estimated: 57 mL/min — ABNORMAL LOW (ref >60)
Glucose, Bld: 81 mg/dL (ref 70–99)
Potassium: 4.4 mmol/L (ref 3.5–5.1)
Sodium: 141 mmol/L (ref 135–145)

## 2023-11-25 SURGERY — EXCISION, LESION, SCALP
Anesthesia: General | Site: Scalp

## 2023-11-25 MED ORDER — LACTATED RINGERS IV SOLN: INTRAVENOUS | Status: DC

## 2023-11-25 MED ORDER — LIDOCAINE HCL (PF) 1 % IJ SOLN
INTRAMUSCULAR | Status: AC
  Filled 2023-11-25: qty 30

## 2023-11-25 MED ORDER — KETOROLAC TROMETHAMINE 30 MG/ML IJ SOLN
INTRAMUSCULAR | Status: AC
  Filled 2023-11-25: qty 1

## 2023-11-25 MED ORDER — CHLORHEXIDINE GLUCONATE 0.12 % MT SOLN
15.0000 mL | Freq: Once | OROMUCOSAL | Status: AC
  Administered 2023-11-25: 15 mL via OROMUCOSAL

## 2023-11-25 MED ORDER — 0.9 % SODIUM CHLORIDE (POUR BTL) OPTIME
TOPICAL | Status: DC | PRN
  Administered 2023-11-25: 500 mL

## 2023-11-25 MED ORDER — CEFAZOLIN SODIUM-DEXTROSE 2-4 GM/100ML-% IV SOLN
2.0000 g | INTRAVENOUS | Status: AC
  Administered 2023-11-25: 2 g via INTRAVENOUS

## 2023-11-25 MED ORDER — LIDOCAINE HCL (PF) 2 % IJ SOLN
INTRAMUSCULAR | Status: AC
  Filled 2023-11-25: qty 5

## 2023-11-25 MED ORDER — CHLORHEXIDINE GLUCONATE CLOTH 2 % EX PADS
6.0000 | MEDICATED_PAD | Freq: Once | CUTANEOUS | Status: AC
  Administered 2023-11-25: 6 via TOPICAL

## 2023-11-25 MED ORDER — KETAMINE HCL 50 MG/5ML IJ SOSY
PREFILLED_SYRINGE | INTRAMUSCULAR | Status: AC
  Filled 2023-11-25: qty 5

## 2023-11-25 MED ORDER — PHENYLEPHRINE 80 MCG/ML (10ML) SYRINGE FOR IV PUSH (FOR BLOOD PRESSURE SUPPORT)
PREFILLED_SYRINGE | INTRAVENOUS | Status: DC | PRN
  Administered 2023-11-25 (×3): 80 ug via INTRAVENOUS

## 2023-11-25 MED ORDER — BUPIVACAINE-EPINEPHRINE (PF) 0.5% -1:200000 IJ SOLN
INTRAMUSCULAR | Status: AC
Start: 2023-11-25 — End: 2023-11-25
  Filled 2023-11-25: qty 30

## 2023-11-25 MED ORDER — PROPOFOL 1000 MG/100ML IV EMUL
INTRAVENOUS | Status: AC
  Filled 2023-11-25: qty 100

## 2023-11-25 MED ORDER — ACETAMINOPHEN 325 MG PO TABS
650.0000 mg | ORAL_TABLET | Freq: Three times a day (TID) | ORAL | 0 refills | Status: AC | PRN
  Filled 2023-11-25: qty 40, 7d supply, fill #0

## 2023-11-25 MED ORDER — CEFAZOLIN SODIUM-DEXTROSE 2-4 GM/100ML-% IV SOLN
INTRAVENOUS | Status: AC
  Filled 2023-11-25: qty 100

## 2023-11-25 MED ORDER — PHENYLEPHRINE 80 MCG/ML (10ML) SYRINGE FOR IV PUSH (FOR BLOOD PRESSURE SUPPORT)
PREFILLED_SYRINGE | INTRAVENOUS | Status: AC
Start: 2023-11-25 — End: 2023-11-25
  Filled 2023-11-25: qty 10

## 2023-11-25 MED ORDER — MIDAZOLAM HCL 2 MG/2ML IJ SOLN
INTRAMUSCULAR | Status: DC | PRN
  Administered 2023-11-25: 2 mg via INTRAVENOUS

## 2023-11-25 MED ORDER — TRAMADOL HCL 50 MG PO TABS
50.0000 mg | ORAL_TABLET | Freq: Three times a day (TID) | ORAL | 0 refills | Status: DC | PRN
  Filled 2023-11-25: qty 6, 2d supply, fill #0

## 2023-11-25 MED ORDER — BUPIVACAINE-EPINEPHRINE 0.5% -1:200000 IJ SOLN
INTRAMUSCULAR | Status: DC | PRN
  Administered 2023-11-25: 7 mL

## 2023-11-25 MED ORDER — PROPOFOL 500 MG/50ML IV EMUL
INTRAVENOUS | Status: DC | PRN
  Administered 2023-11-25: 50 ug via INTRAVENOUS
  Administered 2023-11-25: 100 mg via INTRAVENOUS
  Administered 2023-11-25: 200 ug/kg/min via INTRAVENOUS
  Administered 2023-11-25: 150 ug/kg/min via INTRAVENOUS

## 2023-11-25 MED ORDER — FENTANYL CITRATE (PF) 100 MCG/2ML IJ SOLN
INTRAMUSCULAR | Status: AC
Start: 2023-11-25 — End: 2023-11-25
  Filled 2023-11-25: qty 2

## 2023-11-25 MED ORDER — LIDOCAINE HCL (CARDIAC) PF 100 MG/5ML IV SOSY
PREFILLED_SYRINGE | INTRAVENOUS | Status: DC | PRN
  Administered 2023-11-25: 100 mg via INTRAVENOUS

## 2023-11-25 MED ORDER — FENTANYL CITRATE (PF) 100 MCG/2ML IJ SOLN
INTRAMUSCULAR | Status: DC | PRN
  Administered 2023-11-25 (×2): 50 ug via INTRAVENOUS

## 2023-11-25 MED ORDER — ORAL CARE MOUTH RINSE: 15.0000 mL | Freq: Once | OROMUCOSAL | Status: AC

## 2023-11-25 MED ORDER — CHLORHEXIDINE GLUCONATE 0.12 % MT SOLN
OROMUCOSAL | Status: AC
  Filled 2023-11-25: qty 15

## 2023-11-25 MED ORDER — MIDAZOLAM HCL 2 MG/2ML IJ SOLN
INTRAMUSCULAR | Status: AC
  Filled 2023-11-25: qty 2

## 2023-11-25 MED ORDER — IBUPROFEN 800 MG PO TABS
800.0000 mg | ORAL_TABLET | Freq: Three times a day (TID) | ORAL | 0 refills | Status: DC | PRN
  Filled 2023-11-25: qty 30, 10d supply, fill #0

## 2023-11-25 MED ORDER — DOCUSATE SODIUM 100 MG PO CAPS
100.0000 mg | ORAL_CAPSULE | Freq: Two times a day (BID) | ORAL | 0 refills | Status: AC | PRN
  Filled 2023-11-25: qty 20, 10d supply, fill #0

## 2023-11-25 SURGICAL SUPPLY — 24 items
BLADE SURG 15 STRL LF DISP TIS (BLADE) ×1 IMPLANT
DERMABOND ADVANCED .7 DNX12 (GAUZE/BANDAGES/DRESSINGS) ×1 IMPLANT
DRAPE LAPAROTOMY 77X122 PED (DRAPES) ×1 IMPLANT
DRAPE TABLE BACK 80X90 (DRAPES) IMPLANT
ELECTRODE REM PT RTRN 9FT ADLT (ELECTROSURGICAL) ×2 IMPLANT
GAUZE 4X4 16PLY ~~LOC~~+RFID DBL (SPONGE) ×1 IMPLANT
GAUZE SPONGE 4X4 12PLY STRL (GAUZE/BANDAGES/DRESSINGS) IMPLANT
GLOVE BIOGEL PI IND STRL 7.0 (GLOVE) ×1 IMPLANT
GLOVE SURG SYN 6.5 PF PI (GLOVE) ×3 IMPLANT
GOWN STRL REUS W/ TWL LRG LVL3 (GOWN DISPOSABLE) ×3 IMPLANT
KIT TURNOVER KIT A (KITS) ×1 IMPLANT
LABEL OR SOLS (LABEL) ×1 IMPLANT
MANIFOLD NEPTUNE II (INSTRUMENTS) ×1 IMPLANT
NDL HYPO 22X1.5 SAFETY MO (MISCELLANEOUS) ×1 IMPLANT
NEEDLE HYPO 22X1.5 SAFETY MO (MISCELLANEOUS) ×1 IMPLANT
NS IRRIG 500ML POUR BTL (IV SOLUTION) ×1 IMPLANT
PACK BASIN MINOR ARMC (MISCELLANEOUS) ×1 IMPLANT
SOLUTION PREP PVP 2OZ (MISCELLANEOUS) ×1 IMPLANT
SUT VIC AB 3-0 SH 27X BRD (SUTURE) ×1 IMPLANT
SUTURE MNCRL 4-0 27XMF (SUTURE) ×1 IMPLANT
SYR 30ML LL (SYRINGE) ×1 IMPLANT
SYR BULB IRRIG 60ML STRL (SYRINGE) ×1 IMPLANT
TOWEL OR 17X26 4PK STRL BLUE (TOWEL DISPOSABLE) ×1 IMPLANT
TRAP FLUID SMOKE EVACUATOR (MISCELLANEOUS) ×1 IMPLANT

## 2023-11-25 NOTE — Anesthesia Postprocedure Evaluation (Signed)
 Anesthesia Post Note  Patient: Carol Henderson  Procedure(s) Performed: EXCISION, LESION, SCALP (Scalp)  Patient location during evaluation: PACU Anesthesia Type: General Level of consciousness: awake and alert Pain management: pain level controlled Vital Signs Assessment: post-procedure vital signs reviewed and stable Respiratory status: spontaneous breathing, nonlabored ventilation and respiratory function stable Cardiovascular status: blood pressure returned to baseline and stable Postop Assessment: no apparent nausea or vomiting Anesthetic complications: no   There were no known notable events for this encounter.   Last Vitals:  Vitals:   11/25/23 0900 11/25/23 0912  BP: (!) 118/91 (!) 181/116  Pulse: (!) 56 (!) 55  Resp: 11 16  Temp: (!) 36.2 C (!) 36.1 C  SpO2: 93% 90%    Last Pain:  Vitals:   11/25/23 0912  TempSrc: Temporal  PainSc:                  [name]
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
## 2023-11-25 NOTE — Op Note (Signed)
 Pre-Op Dx: Scalp cyst Post-Op Dx: Same Anesthesia: MAC EBL: 5 mL Complications:  none apparent Specimen: Scalp cyst Procedure: excisional biopsy of scalp cyst Surgeon: Tye  Indications for procedure: See H&P  Description of Procedure:  Consent obtained, time out performed.  Patient placed in prone position.  Area sterilized and draped in usual position.  Local infused to area previously marked.  4 cm incision made through dermis with 15blade and pilar cyst noted in subcutaneous layer.  The  2.5 cm x 2.5 cm x 2.3 cm cyst then removed from surrounding tissue completely using electrocautery, passed off field pending pathology.  Wound hemostasis noted, then closed with running 4-0 monocryl in subcuticular fashion for epidermal layer.  Wound then dressed with dermabond.  Pt tolerated procedure well, and transferred to PACU in stable condition. Sponge and instrument count correct at end of procedure.

## 2023-11-25 NOTE — Interval H&P Note (Signed)
 No change. OK to proceed.

## 2023-11-25 NOTE — Transfer of Care (Signed)
 Immediate Anesthesia Transfer of Care Note  Patient: Carol Henderson  Procedure(s) Performed: EXCISION, LESION, SCALP (Scalp)  Patient Location: PACU  Anesthesia Type:General  Level of Consciousness: awake  Airway & Oxygen Therapy: Patient Spontanous Breathing  Post-op Assessment: Report given to RN and Post -op Vital signs reviewed and stable  Post vital signs: Reviewed and stable  Last Vitals:  Vitals Value Taken Time  BP 100/88 11/25/23 08:50  Temp 36.4 C 11/25/23 08:41  Pulse 141 11/25/23 08:55  Resp 15 11/25/23 08:55  SpO2 95 % 11/25/23 08:55  Vitals shown include unfiled device data.  Last Pain:  Vitals:   11/25/23 0850  TempSrc:   PainSc: 0-No pain         Complications: There were no known notable events for this encounter.

## 2023-11-25 NOTE — Discharge Instructions (Signed)
 Removal, Care After This sheet gives you information about how to care for yourself after your procedure. Your health care provider may also give you more specific instructions. If you have problems or questions, contact your health care provider. What can I expect after the procedure? After the procedure, it is common to have: Soreness. Bruising. Itching. Follow these instructions at home: site care Follow instructions from your health care provider about how to take care of your site. Make sure you: Wash your hands with soap and water before and after you change your bandage (dressing). If soap and water are not available, use hand sanitizer. Leave stitches (sutures), skin glue, or adhesive strips in place. These skin closures may need to stay in place for 2 weeks or longer. If adhesive strip edges start to loosen and curl up, you may trim the loose edges. Do not remove adhesive strips completely unless your health care provider tells you to do that. If the area bleeds or bruises, apply gentle pressure for 10 minutes. OK TO SHOWER IN 24HRS  Check your site every day for signs of infection. Check for: Redness, swelling, or pain. Fluid or blood. Warmth. Pus or a bad smell.  General instructions Rest and then return to your normal activities as told by your health care provider.  tylenol and advil as needed for discomfort.  Please alternate between the two every four hours as needed for pain.    Use narcotics, if prescribed, only when tylenol and motrin is not enough to control pain.  325-650mg  every 8hrs to max of 3000mg /24hrs (including the 325mg  in every norco dose) for the tylenol.    Advil up to 800mg  per dose every 8hrs as needed for pain.   Keep all follow-up visits as told by your health care provider. This is important. Contact a health care provider if: You have redness, swelling, or pain around your site. You have fluid or blood coming from your site. Your site feels warm to  the touch. You have pus or a bad smell coming from your site. You have a fever. Your sutures, skin glue, or adhesive strips loosen or come off sooner than expected. Get help right away if: You have bleeding that does not stop with pressure or a dressing. Summary After the procedure, it is common to have some soreness, bruising, and itching at the site. Follow instructions from your health care provider about how to take care of your site. Check your site every day for signs of infection. Contact a health care provider if you have redness, swelling, or pain around your site, or your site feels warm to the touch. Keep all follow-up visits as told by your health care provider. This is important. This information is not intended to replace advice given to you by your health care provider. Make sure you discuss any questions you have with your health care provider. Document Released: 04/18/2015 Document Revised: 09/19/2017 Document Reviewed: 09/19/2017 Elsevier Interactive Patient Education  [employer].

## 2023-11-26 ENCOUNTER — Encounter: Payer: Self-pay | Admitting: Surgery

## 2023-11-28 LAB — SURGICAL PATHOLOGY

## 2023-11-29 ENCOUNTER — Telehealth: Payer: Self-pay

## 2023-11-29 NOTE — Telephone Encounter (Signed)
 Patient sister called stating that the patient was having severe stomach pains and she was bleeding from her bottom, states it was bright red, doesn't think she's strained to have a BM but does have a hx of stomach ulcers, I advised the sister to take the patient to the ED for evaluation.  Sister said she would be taking her to [facility] bc its closer to her location

## 2023-12-20 ENCOUNTER — Emergency Department (HOSPITAL_COMMUNITY)

## 2023-12-20 ENCOUNTER — Emergency Department (HOSPITAL_COMMUNITY)
Admission: EM | Admit: 2023-12-20 | Discharge: 2023-12-20 | Discharge status: Against Medical Advice | Attending: Emergency Medicine | Admitting: Emergency Medicine

## 2023-12-20 ENCOUNTER — Encounter (HOSPITAL_COMMUNITY): Payer: Self-pay | Admitting: Emergency Medicine

## 2023-12-20 ENCOUNTER — Other Ambulatory Visit: Payer: Self-pay

## 2023-12-20 DIAGNOSIS — R059 Cough, unspecified: Secondary | ICD-10-CM | POA: Diagnosis present

## 2023-12-20 DIAGNOSIS — J449 Chronic obstructive pulmonary disease, unspecified: Secondary | ICD-10-CM | POA: Insufficient documentation

## 2023-12-20 DIAGNOSIS — I2510 Atherosclerotic heart disease of native coronary artery without angina pectoris: Secondary | ICD-10-CM | POA: Insufficient documentation

## 2023-12-20 DIAGNOSIS — I10 Essential (primary) hypertension: Secondary | ICD-10-CM | POA: Insufficient documentation

## 2023-12-20 DIAGNOSIS — R197 Diarrhea, unspecified: Secondary | ICD-10-CM | POA: Diagnosis not present

## 2023-12-20 DIAGNOSIS — Z5329 Procedure and treatment not carried out because of patient's decision for other reasons: Secondary | ICD-10-CM | POA: Diagnosis not present

## 2023-12-20 DIAGNOSIS — Z79899 Other long term (current) drug therapy: Secondary | ICD-10-CM | POA: Diagnosis not present

## 2023-12-20 DIAGNOSIS — Z8544 Personal history of malignant neoplasm of other female genital organs: Secondary | ICD-10-CM | POA: Diagnosis not present

## 2023-12-20 DIAGNOSIS — R202 Paresthesia of skin: Secondary | ICD-10-CM | POA: Diagnosis not present

## 2023-12-20 LAB — COMPREHENSIVE METABOLIC PANEL WITH GFR
ALT: 14 U/L (ref 0–44)
AST: 17 U/L (ref 15–41)
Albumin: 3.5 g/dL (ref 3.5–5.0)
Alkaline Phosphatase: 89 U/L (ref 38–126)
Anion gap: 10 (ref 5–15)
BUN: 25 mg/dL — ABNORMAL HIGH (ref 6–20)
CO2: 21 mmol/L — ABNORMAL LOW (ref 22–32)
Calcium: 9.1 mg/dL (ref 8.9–10.3)
Chloride: 107 mmol/L (ref 98–111)
Creatinine, Ser: 1.16 mg/dL — ABNORMAL HIGH (ref 0.44–1.00)
GFR, Estimated: 55 mL/min — ABNORMAL LOW (ref >60)
Glucose, Bld: 80 mg/dL (ref 70–99)
Potassium: 4.2 mmol/L (ref 3.5–5.1)
Sodium: 138 mmol/L (ref 135–145)
Total Bilirubin: 0.7 mg/dL (ref 0.0–1.2)
Total Protein: 7.7 g/dL (ref 6.5–8.1)

## 2023-12-20 LAB — I-STAT CHEM 8, ED
BUN: 25 mg/dL — ABNORMAL HIGH (ref 6–20)
Calcium, Ion: 1.1 mmol/L — ABNORMAL LOW (ref 1.15–1.40)
Chloride: 109 mmol/L (ref 98–111)
Creatinine, Ser: 1.2 mg/dL — ABNORMAL HIGH (ref 0.44–1.00)
Glucose, Bld: 79 mg/dL (ref 70–99)
HCT: 43 % (ref 36.0–46.0)
Hemoglobin: 14.6 g/dL (ref 12.0–15.0)
Potassium: 4.2 mmol/L (ref 3.5–5.1)
Sodium: 140 mmol/L (ref 135–145)
TCO2: 20 mmol/L — ABNORMAL LOW (ref 22–32)

## 2023-12-20 LAB — PROTIME-INR
INR: 0.9 (ref 0.8–1.2)
Prothrombin Time: 13 s (ref 11.4–15.2)

## 2023-12-20 LAB — DIFFERENTIAL
Abs Immature Granulocytes: 0.02 K/uL (ref 0.00–0.07)
Basophils Absolute: 0.1 K/uL (ref 0.0–0.1)
Basophils Relative: 1 %
Eosinophils Absolute: 0.3 K/uL (ref 0.0–0.5)
Eosinophils Relative: 3 %
Immature Granulocytes: 0 %
Lymphocytes Relative: 32 %
Lymphs Abs: 2.8 K/uL (ref 0.7–4.0)
Monocytes Absolute: 0.7 K/uL (ref 0.1–1.0)
Monocytes Relative: 8 %
Neutro Abs: 5.1 K/uL (ref 1.7–7.7)
Neutrophils Relative %: 56 %

## 2023-12-20 LAB — RAPID URINE DRUG SCREEN, HOSP PERFORMED
Amphetamines: NOT DETECTED
Barbiturates: NOT DETECTED
Benzodiazepines: NOT DETECTED
Cocaine: NOT DETECTED
Opiates: NOT DETECTED
Tetrahydrocannabinol: POSITIVE — AB

## 2023-12-20 LAB — CBC
HCT: 42.4 % (ref 36.0–46.0)
Hemoglobin: 14.1 g/dL (ref 12.0–15.0)
MCH: 30.1 pg (ref 26.0–34.0)
MCHC: 33.3 g/dL (ref 30.0–36.0)
MCV: 90.6 fL (ref 80.0–100.0)
Platelets: 381 K/uL (ref 150–400)
RBC: 4.68 MIL/uL (ref 3.87–5.11)
RDW: 13.4 % (ref 11.5–15.5)
WBC: 9 K/uL (ref 4.0–10.5)
nRBC: 0 % (ref 0.0–0.2)

## 2023-12-20 LAB — RESP PANEL BY RT-PCR (RSV, FLU A&B, COVID)  RVPGX2
Influenza A by PCR: NEGATIVE
Influenza B by PCR: NEGATIVE
Resp Syncytial Virus by PCR: NEGATIVE
SARS Coronavirus 2 by RT PCR: NEGATIVE

## 2023-12-20 LAB — ETHANOL: Alcohol, Ethyl (B): <15 mg/dL (ref <15)

## 2023-12-20 LAB — CBG MONITORING, ED: Glucose-Capillary: 72 mg/dL (ref 70–99)

## 2023-12-20 MED ORDER — SODIUM CHLORIDE 0.9 % IV BOLUS: 1000.0000 mL | Freq: Once | INTRAVENOUS | Status: DC

## 2023-12-20 MED ORDER — IPRATROPIUM-ALBUTEROL 0.5-2.5 (3) MG/3ML IN SOLN
3.0000 mL | Freq: Once | RESPIRATORY_TRACT | Status: AC
  Administered 2023-12-20: 3 mL via RESPIRATORY_TRACT
  Filled 2023-12-20: qty 3

## 2023-12-20 MED ORDER — MIDAZOLAM HCL 2 MG/2ML IJ SOLN: 1.0000 mg | Freq: Once | INTRAMUSCULAR | Status: DC | PRN

## 2023-12-20 NOTE — ED Notes (Signed)
 Pt states Im gone, I can't and won't wait in this bed for 3 hours for the scan pt informed of risks of leaving against medical advice and verbalizes understanding. AMA form signed by pt and witnessed by this RN, pt stated I'm literally fine

## 2023-12-20 NOTE — ED Notes (Signed)
 Pt placed on 2 L via [place] due to SpO2 dropping in the 87-88% RA

## 2023-12-20 NOTE — ED Notes (Signed)
 Pt states these Sx of R side neck, arm, leg, and inside of mouth numbness has been ongoing x 2 weeks

## 2023-12-20 NOTE — ED Triage Notes (Signed)
 Pt with multiple complaints at this time, including the inside of her mouth being numb, her R side of her neck being numb, R arm and fingers numb, and R leg numb and painful, 9/10. Pt is able to turn her neck. EDP seen pt at bedside

## 2023-12-20 NOTE — ED Notes (Signed)
 Attempted IV access twice on pt, unable to establish IV access, will ask RN certified in [place]  IV to attempt to obtain IV access

## 2023-12-20 NOTE — ED Provider Notes (Signed)
 [place] EMERGENCY DEPARTMENT AT [facility] Provider Note   CSN: [id number] Arrival date & time: 12/20/23  0730     Patient presents with: Leg Pain   Carol Henderson is a 58 y.o. female.   Pt is a 58 yo female with pmhx significant for htn, copd, gerd, CAD, vulvar cancer, hep c, anxiety, schizophrenia, multiple personality d/o, bipolar d/o, pvd, hx substance abuse, and tobacco abuse.  Pt presents to the ED today with multiple complaints.  She has felt numbness to her right face, right arm and right leg for 1-2 weeks.  She has had diarrhea for several days.  She has had a cough.  She's had a bad taste in her mouth.  Pt denies fevers.         Prior to Admission medications   Medication Sig Start Date End Date Taking? Authorizing Provider  acetaminophen  (TYLENOL ) 325 MG tablet Take 2 tablets (650 mg total) by mouth every 8 (eight) hours as needed for mild pain (pain score 1-3). 11/25/23 12/25/23 Yes Sakai, Isami, DO  albuterol  (VENTOLIN  HFA) 108 (90 Base) MCG/ACT inhaler Inhale 2 puffs into the lungs every 6 (six) hours as needed for wheezing or shortness of breath. 10/21/23  Yes Fernand Fredy RAMAN, MD  amLODipine  (NORVASC ) 10 MG tablet Take 1 tablet (10 mg total) by mouth daily. 10/21/23  Yes Fernand Fredy RAMAN, MD  gabapentin  (NEURONTIN ) 400 MG capsule Take 1 capsule (400 mg total) by mouth 3 (three) times daily. Reported on 07/02/2015 10/21/23  Yes Fernand Fredy RAMAN, MD  ibuprofen  (ADVIL ) 800 MG tablet Take 1 tablet (800 mg total) by mouth every 8 (eight) hours as needed for mild pain (pain score 1-3) or moderate pain (pain score 4-6). 11/25/23  Yes Sakai, Isami, DO  losartan  (COZAAR ) 100 MG tablet Take 1 tablet (100 mg total) by mouth daily. 10/21/23  Yes Fernand Fredy RAMAN, MD  nitroGLYCERIN  (NITROSTAT ) 0.4 MG SL tablet Place 1 tablet (0.4 mg total) under the tongue every 5 (five) minutes as needed for chest pain. 10/21/23  Yes Fernand Fredy RAMAN, MD  ondansetron  (ZOFRAN  ODT) 4 MG disintegrating  tablet Take 1 tablet (4 mg total) by mouth every 8 (eight) hours as needed for nausea or vomiting. 10/21/23  Yes Fernand Fredy RAMAN, MD  pantoprazole  (PROTONIX ) 40 MG tablet Take 1 tablet (40 mg total) by mouth daily. 10/21/23  Yes Fernand Fredy RAMAN, MD  carvedilol  (COREG ) 3.125 MG tablet Take 1 tablet (3.125 mg total) by mouth 2 (two) times daily with a meal. Patient not taking: Reported on 12/20/2023 10/21/23   Fernand Fredy RAMAN, MD    Allergies: Ativan  [lorazepam ] and Amitriptyline     Review of Systems  Respiratory:  Positive for cough.   Gastrointestinal:  Positive for diarrhea.  Neurological:  Positive for numbness.  All other systems reviewed and are negative.   Updated Vital Signs BP (!) 158/97   Pulse 66   Temp 98.3 F (36.8 C) (Oral)   Resp (!) 22   Ht 5' 2 (1.575 m)   Wt 63.5 kg   SpO2 94%   BMI 25.61 kg/m   Physical Exam Vitals and nursing note reviewed.  Constitutional:      Appearance: Normal appearance.  HENT:     Head: Normocephalic and atraumatic.     Right Ear: External ear normal.     Left Ear: External ear normal.     Nose: Nose normal.     Mouth/Throat:     Mouth:  Mucous membranes are dry.  Eyes:     Extraocular Movements: Extraocular movements intact.     Conjunctiva/sclera: Conjunctivae normal.     Pupils: Pupils are equal, round, and reactive to light.  Cardiovascular:     Rate and Rhythm: Normal rate and regular rhythm.     Pulses: Normal pulses.  Pulmonary:     Effort: Pulmonary effort is normal.     Breath sounds: Wheezing present.  Abdominal:     General: Abdomen is flat. Bowel sounds are normal.     Palpations: Abdomen is soft.  Musculoskeletal:        General: Normal range of motion.     Cervical back: Normal range of motion and neck supple.  Skin:    General: Skin is warm.     Capillary Refill: Capillary refill takes less than 2 seconds.  Neurological:     Mental Status: She is alert and oriented to person, place, and time.     Comments:  Subjective numbness right face, leg, arm  Psychiatric:        Mood and Affect: Mood normal.        Behavior: Behavior normal.     (all labs ordered are listed, but only abnormal results are displayed) Labs Reviewed  COMPREHENSIVE METABOLIC PANEL WITH GFR - Abnormal; Notable for the following components:      Result Value   CO2 21 (*)    BUN 25 (*)    Creatinine, Ser 1.16 (*)    GFR, Estimated 55 (*)    All other components within normal limits  RAPID URINE DRUG SCREEN, HOSP PERFORMED - Abnormal; Notable for the following components:   Tetrahydrocannabinol POSITIVE (*)    All other components within normal limits  I-STAT CHEM 8, ED - Abnormal; Notable for the following components:   BUN 25 (*)    Creatinine, Ser 1.20 (*)    Calcium , Ion 1.10 (*)    TCO2 20 (*)    All other components within normal limits  RESP PANEL BY RT-PCR (RSV, FLU A&B, COVID)  RVPGX2  ETHANOL  PROTIME-INR  CBC  DIFFERENTIAL  CBG MONITORING, ED    EKG: EKG Interpretation Date/Time:  Tuesday December 20 2023 08:06:29 EDT Ventricular Rate:  63 PR Interval:  179 QRS Duration:  165 QT Interval:  459 QTC Calculation: 470 R Axis:   -88  Text Interpretation: Sinus rhythm Probable left atrial enlargement Right bundle branch block No significant change since last tracing Confirmed by Dean Clarity [phone]) on 12/20/2023 8:34:05 AM  Radiology: CT HEAD WO CONTRAST Result Date: 12/20/2023 EXAM: CT HEAD WITHOUT CONTRAST 12/20/2023 09:26:09 AM TECHNIQUE: CT of the head was performed without the administration of intravenous contrast. Automated exposure control, iterative reconstruction, and/or weight based adjustment of the mA/kV was utilized to reduce the radiation dose to as low as reasonably achievable. COMPARISON: CT of the head dated 07/31/2010. CLINICAL HISTORY: Neuro deficit, acute, stroke suspected. Inside of her mouth being numb, her R side of her neck being numb, R arm and fingers numb, and R leg numb  and painful. FINDINGS: BRAIN AND VENTRICLES: No acute hemorrhage. No evidence of acute infarct. No hydrocephalus. No extra-axial collection. No mass effect or midline shift. Since the previous study, the patient has developed chronic lacunar infarcts within the right caudate nucleus and the periventricular white matter along the anterior horn of the right lateral ventricle. There is mild deep cerebral white matter disease. ORBITS: No acute abnormality. SINUSES: No acute abnormality. SOFT TISSUES  AND SKULL: No acute soft tissue abnormality. No skull fracture. IMPRESSION: 1. No acute intracranial abnormality. 2. 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 3. Mild deep cerebral white matter disease. Electronically signed by: Evalene Coho MD 12/20/2023 09:35 AM EDT RP Workstation: [id number]   DG Chest Portable 1 View Result Date: 12/20/2023 CLINICAL DATA:  Cough. EXAM: PORTABLE CHEST 1 VIEW COMPARISON:  04/24/2018 FINDINGS: The lungs are clear without focal pneumonia, edema, pneumothorax or pleural effusion. Cardiopericardial silhouette is at upper limits of normal for size. No acute bony abnormality. Telemetry leads overlie the chest. IMPRESSION: No active disease. Electronically Signed   By: Camellia Candle M.D.   On: 12/20/2023 08:15     Procedures   Medications Ordered in the ED  sodium chloride  0.9 % bolus 1,000 mL (1,000 mLs Intravenous Not Given 12/20/23 0957)  midazolam  (VERSED ) injection 1 mg (has no administration in time range)  ipratropium-albuterol  (DUONEB) 0.5-2.5 (3) MG/3ML nebulizer solution 3 mL (3 mLs Nebulization Given 12/20/23 0834)                                    Medical Decision Making Amount and/or Complexity of Data Reviewed Labs: ordered. Radiology: ordered.  Risk Prescription drug management.   This patient presents to the ED for concern of numbness, this  involves an extensive number of treatment options, and is a complaint that carries with it a high risk of complications and morbidity.  The differential diagnosis includes cva, electrolyte abn, infection   Co morbidities that complicate the patient evaluation  htn, copd, gerd, CAD, vulvar cancer, hep c, anxiety, schizophrenia, multiple personality d/o, bipolar d/o, pvd, hx substance abuse, and tobacco abuse   Additional history obtained:  Additional history obtained from epic chart review External records from outside source obtained and reviewed including family   Lab Tests:  I Ordered, and personally interpreted labs.  The pertinent results include:  cbc nl, cmp nl other than cr sl elevated at 1.16; inr 0.9, uds + MJ; etoh neg; covid/flu/rsv neg   Imaging Studies ordered:  I ordered imaging studies including cxr and ct head  I independently visualized and interpreted imaging which showed  CXR: No active disease.  CT head: . No acute intracranial abnormality.  2. Chronic lacunar infarcts within the right caudate nucleus and the  periventricular white matter along the anterior horn of the right lateral  ventricle.  3. Mild deep cerebral white matter disease.   I agree with the radiologist interpretation   Cardiac Monitoring:  The patient was maintained on a cardiac monitor.  I personally viewed and interpreted the cardiac monitored which showed an underlying rhythm of: nsr   Medicines ordered and prescription drug management:  I ordered medication including duoneb for sx.  Pt refused IVFs after nurse attempted to get an IV and was unsuccessful Reevaluation of the patient after these medicines showed that the patient improved I have reviewed the patients home medicines and have made adjustments as needed   Test Considered:  mri   Problem List / ED Course:  Numbness:  CT shows multiple old strokes.  I am concerned for a new stroke.  I ordered an MRI which pt said  she'd wait for.  Five minutes after that, she told the nurse she did not want to wait and left.  She did  not wait for me to come back and talk to her again.  She did not wait for AVS. Diarrhea:  no diarrhea while here.  Electrolytes nl.  Pt refused IVFs.   Reevaluation:  After the interventions noted above, I reevaluated the patient and found that they have :improved   Social Determinants of Health:  Lives at home   Dispostion:  AMA     Final diagnoses:  Diarrhea, unspecified type  Paresthesias    ED Discharge Orders     None          Dean Clarity, MD 12/20/23 1034

## 2023-12-26 ENCOUNTER — Encounter: Payer: Self-pay | Admitting: Surgery

## 2024-01-04 DIAGNOSIS — R55 Syncope and collapse: Secondary | ICD-10-CM | POA: Diagnosis not present

## 2024-01-04 DIAGNOSIS — T451X5A Adverse effect of antineoplastic and immunosuppressive drugs, initial encounter: Secondary | ICD-10-CM | POA: Diagnosis not present

## 2024-01-04 DIAGNOSIS — I959 Hypotension, unspecified: Secondary | ICD-10-CM | POA: Diagnosis not present

## 2024-01-04 DIAGNOSIS — I2510 Atherosclerotic heart disease of native coronary artery without angina pectoris: Secondary | ICD-10-CM | POA: Diagnosis not present

## 2024-01-04 DIAGNOSIS — R112 Nausea with vomiting, unspecified: Secondary | ICD-10-CM | POA: Diagnosis not present

## 2024-01-04 DIAGNOSIS — I2111 ST elevation (STEMI) myocardial infarction involving right coronary artery: Secondary | ICD-10-CM | POA: Diagnosis not present

## 2024-01-04 DIAGNOSIS — K219 Gastro-esophageal reflux disease without esophagitis: Secondary | ICD-10-CM | POA: Diagnosis not present

## 2024-01-04 DIAGNOSIS — E785 Hyperlipidemia, unspecified: Secondary | ICD-10-CM | POA: Diagnosis not present

## 2024-01-04 DIAGNOSIS — I639 Cerebral infarction, unspecified: Secondary | ICD-10-CM | POA: Diagnosis not present

## 2024-01-05 ENCOUNTER — Other Ambulatory Visit: Payer: Self-pay | Admitting: Internal Medicine

## 2024-01-05 DIAGNOSIS — I639 Cerebral infarction, unspecified: Secondary | ICD-10-CM

## 2024-01-06 ENCOUNTER — Telehealth: Payer: Self-pay

## 2024-01-06 NOTE — Telephone Encounter (Signed)
 Female LM for call back about  the patient needed a referral for Neuro, she said that Dr Florencio suggested that it get ordered.

## 2024-01-08 ENCOUNTER — Ambulatory Visit
Admission: RE | Admit: 2024-01-08 | Discharge: 2024-01-08 | Disposition: A | Discharge status: Home / Self Care | Source: Ambulatory Visit | Attending: Internal Medicine | Admitting: Internal Medicine

## 2024-01-08 DIAGNOSIS — I639 Cerebral infarction, unspecified: Secondary | ICD-10-CM | POA: Insufficient documentation

## 2024-01-08 DIAGNOSIS — I6782 Cerebral ischemia: Secondary | ICD-10-CM | POA: Diagnosis not present

## 2024-01-08 MED ORDER — GADOBUTROL 1 MMOL/ML IV SOLN
6.0000 mL | Freq: Once | INTRAVENOUS | Status: AC | PRN
  Administered 2024-01-08: 6 mL via INTRAVENOUS

## 2024-01-10 ENCOUNTER — Ambulatory Visit (INDEPENDENT_AMBULATORY_CARE_PROVIDER_SITE_OTHER): Admitting: Internal Medicine

## 2024-01-10 VITALS — BP 110/74 | HR 75 | Temp 97.9°F | Ht 62.0 in | Wt 135.8 lb

## 2024-01-10 DIAGNOSIS — R1013 Epigastric pain: Secondary | ICD-10-CM

## 2024-01-10 DIAGNOSIS — J449 Chronic obstructive pulmonary disease, unspecified: Secondary | ICD-10-CM | POA: Diagnosis not present

## 2024-01-10 DIAGNOSIS — G44209 Tension-type headache, unspecified, not intractable: Secondary | ICD-10-CM | POA: Diagnosis not present

## 2024-01-10 DIAGNOSIS — I10 Essential (primary) hypertension: Secondary | ICD-10-CM | POA: Diagnosis not present

## 2024-01-10 MED ORDER — INDOMETHACIN 50 MG PO CAPS: 50.0000 mg | ORAL_CAPSULE | Freq: Three times a day (TID) | ORAL | 0 refills | Status: DC

## 2024-01-10 MED ORDER — ALBUTEROL SULFATE HFA 108 (90 BASE) MCG/ACT IN AERS: 2.0000 | INHALATION_SPRAY | Freq: Four times a day (QID) | RESPIRATORY_TRACT | 2 refills | Status: AC | PRN
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
## 2024-01-18 ENCOUNTER — Other Ambulatory Visit: Payer: Self-pay | Admitting: Otolaryngology

## 2024-01-18 DIAGNOSIS — D37030 Neoplasm of uncertain behavior of the parotid salivary glands: Secondary | ICD-10-CM | POA: Diagnosis not present

## 2024-01-18 DIAGNOSIS — R519 Headache, unspecified: Secondary | ICD-10-CM | POA: Diagnosis not present

## 2024-01-18 DIAGNOSIS — K118 Other diseases of salivary glands: Secondary | ICD-10-CM

## 2024-01-26 ENCOUNTER — Other Ambulatory Visit: Payer: Self-pay

## 2024-01-26 ENCOUNTER — Emergency Department (HOSPITAL_COMMUNITY)
Admission: EM | Admit: 2024-01-26 | Discharge: 2024-01-27 | Disposition: A | Discharge status: Home / Self Care | Attending: Emergency Medicine | Admitting: Emergency Medicine

## 2024-01-26 ENCOUNTER — Encounter (HOSPITAL_COMMUNITY): Payer: Self-pay | Admitting: Emergency Medicine

## 2024-01-26 ENCOUNTER — Inpatient Hospital Stay: Admission: RE | Admit: 2024-01-26

## 2024-01-26 DIAGNOSIS — M545 Low back pain, unspecified: Secondary | ICD-10-CM | POA: Insufficient documentation

## 2024-01-26 DIAGNOSIS — N200 Calculus of kidney: Secondary | ICD-10-CM | POA: Diagnosis not present

## 2024-01-26 DIAGNOSIS — R61 Generalized hyperhidrosis: Secondary | ICD-10-CM | POA: Insufficient documentation

## 2024-01-26 DIAGNOSIS — R109 Unspecified abdominal pain: Secondary | ICD-10-CM | POA: Insufficient documentation

## 2024-01-26 DIAGNOSIS — R110 Nausea: Secondary | ICD-10-CM | POA: Diagnosis not present

## 2024-01-26 DIAGNOSIS — N2889 Other specified disorders of kidney and ureter: Secondary | ICD-10-CM | POA: Diagnosis not present

## 2024-01-26 DIAGNOSIS — K625 Hemorrhage of anus and rectum: Secondary | ICD-10-CM | POA: Insufficient documentation

## 2024-01-26 DIAGNOSIS — R519 Headache, unspecified: Secondary | ICD-10-CM | POA: Insufficient documentation

## 2024-01-26 LAB — URINALYSIS, ROUTINE W REFLEX MICROSCOPIC
Bilirubin Urine: NEGATIVE
Glucose, UA: NEGATIVE mg/dL
Hgb urine dipstick: NEGATIVE
Ketones, ur: NEGATIVE mg/dL
Nitrite: NEGATIVE
Protein, ur: NEGATIVE mg/dL
Specific Gravity, Urine: 1.011 (ref 1.005–1.030)
pH: 5 (ref 5.0–8.0)

## 2024-01-26 NOTE — ED Triage Notes (Signed)
 Pt reports left flank pain starting today that feels same as prior kidney stones, headache, intermittent rectal bleeding x 1 week (bright red blood), no hx of hemorrhoids

## 2024-01-27 ENCOUNTER — Emergency Department (HOSPITAL_COMMUNITY)

## 2024-01-27 DIAGNOSIS — N200 Calculus of kidney: Secondary | ICD-10-CM | POA: Diagnosis not present

## 2024-01-27 DIAGNOSIS — M545 Low back pain, unspecified: Secondary | ICD-10-CM | POA: Diagnosis not present

## 2024-01-27 DIAGNOSIS — N2889 Other specified disorders of kidney and ureter: Secondary | ICD-10-CM | POA: Diagnosis not present

## 2024-01-27 MED ORDER — NAPROXEN 500 MG PO TABS: 500.0000 mg | ORAL_TABLET | Freq: Two times a day (BID) | ORAL | 0 refills | Status: DC

## 2024-01-27 MED ORDER — KETOROLAC TROMETHAMINE 60 MG/2ML IM SOLN
30.0000 mg | Freq: Once | INTRAMUSCULAR | Status: AC
  Administered 2024-01-27: 30 mg via INTRAMUSCULAR
  Filled 2024-01-27: qty 2

## 2024-01-27 MED ORDER — MORPHINE SULFATE (PF) 4 MG/ML IV SOLN
4.0000 mg | Freq: Once | INTRAVENOUS | Status: AC
  Administered 2024-01-27: 4 mg via INTRAMUSCULAR
  Filled 2024-01-27: qty 1

## 2024-01-27 MED ORDER — METHOCARBAMOL 500 MG PO TABS: 500.0000 mg | ORAL_TABLET | Freq: Two times a day (BID) | ORAL | 0 refills | Status: DC

## 2024-01-27 NOTE — ED Provider Notes (Signed)
 [place] [facility] AT [facility]  Provider Note  CSN: [id number] Arrival date & time: 01/26/24 1934  History Chief Complaint  Patient presents with   Flank Pain    Carol Henderson is a 58 y.o. female here with sister for evaluation of L flank pain onset a few hours ago, and similar to previous renal colic. She denies any dysuria or hematuria. Has been diaphoretic and nauseated.   She has additional complaints of headaches and rectal bleeding, both of which are chronic and being managed outpatient.    Home Medications Prior to Admission medications   Medication Sig Start Date End Date Taking? Authorizing Provider  methocarbamol (ROBAXIN) 500 MG tablet Take 1 tablet (500 mg total) by mouth 2 (two) times daily. 01/27/24  Yes [name], MD  naproxen (NAPROSYN) 500 MG tablet Take 1 tablet (500 mg total) by mouth 2 (two) times daily. 01/27/24  Yes [name], MD  albuterol  (VENTOLIN  HFA) 108 [phone] Base) MCG/ACT inhaler Inhale 2 puffs into the lungs every 6 (six) hours as needed for wheezing or shortness of breath. 01/10/24   [name], MD  ALPRAZolam  (NIRAVAM ) 1 MG dissolvable tablet Take 1 mg by mouth at bedtime as needed for anxiety.    [provider]  amLODipine  (NORVASC ) 10 MG tablet Take 1 tablet (10 mg total) by mouth daily. 10/21/23   [name], MD  carvedilol  (COREG ) 3.125 MG tablet Take 1 tablet (3.125 mg total) by mouth 2 (two) times daily with a meal. Patient not taking: Reported on 01/10/2024 10/21/23   [name], MD  cloNIDine  (CATAPRES  - DOSED IN MG/24 HR) 0.2 mg/24hr patch Place 0.2 mg onto the skin once a week.    [provider]  gabapentin  (NEURONTIN ) 400 MG capsule Take 1 capsule (400 mg total) by mouth 3 (three) times daily. Reported on 07/02/2015 10/21/23   [name], MD  labetalol  (NORMODYNE ) 100 MG tablet Take 100 mg by mouth 2 (two) times daily.    [provider]  losartan  (COZAAR )  100 MG tablet Take 1 tablet (100 mg total) by mouth daily. 10/21/23   [name], MD  nitroGLYCERIN  (NITROSTAT ) 0.4 MG SL tablet Place 1 tablet (0.4 mg total) under the tongue every 5 (five) minutes as needed for chest pain. 10/21/23   [name], MD  ondansetron  (ZOFRAN  ODT) 4 MG disintegrating tablet Take 1 tablet (4 mg total) by mouth every 8 (eight) hours as needed for nausea or vomiting. 10/21/23   [name], MD  pantoprazole  (PROTONIX ) 40 MG tablet Take 1 tablet (40 mg total) by mouth daily. 10/21/23   [name], MD     Allergies    Ativan  [lorazepam ] and Amitriptyline    Review of Systems   Review of Systems Please see HPI for pertinent positives and negatives  Physical Exam BP (!) 164/89 (BP Location: Right Arm)   Pulse 100   Temp 99 F (37.2 C) (Oral)   Resp (!) 22   Ht 5' 2 (1.575 m)   Wt 61.2 kg   SpO2 96%   BMI 24.69 kg/m   Physical Exam Vitals and nursing note reviewed.  Constitutional:      Appearance: Normal appearance.  HENT:     Head: Normocephalic and atraumatic.     Nose: Nose normal.     Mouth/Throat:     Mouth: Mucous membranes are moist.  Eyes:     Extraocular Movements:  Extraocular movements intact.     Conjunctiva/sclera: Conjunctivae normal.  Cardiovascular:     Rate and Rhythm: Normal rate.  Pulmonary:     Effort: Pulmonary effort is normal.     Breath sounds: Normal breath sounds.  Abdominal:     General: Abdomen is flat.     Palpations: Abdomen is soft.     Tenderness: There is no abdominal tenderness.  Musculoskeletal:        General: Tenderness (L flank) present. No swelling. Normal range of motion.     Cervical back: Neck supple.  Skin:    General: Skin is warm and dry.  Neurological:     General: No focal deficit present.     Mental Status: She is alert.  Psychiatric:        Mood and Affect: Mood normal.     ED Results / Procedures / Treatments   EKG None  Procedures Procedures  Medications Ordered in  the ED Medications  ketorolac  (TORADOL ) injection 30 mg (has no administration in time range)  morphine  (PF) 4 MG/ML injection 4 mg (4 mg Intramuscular Given 01/27/24 0038)    Initial Impression and Plan  Patient here with acute concern of L flank pain. Appears in discomfort. UA is neg for blood or signs of infection. Will send for CT. She refuses IV, will give IM analgesia in the meantime.   ED Course   Clinical Course as of 01/27/24 9786  Fri Jan 27, 2024  0210 I personally viewed the images from radiology studies and agree with radiologist interpretation: CT is neg for left sided ureteral stone to account for her symptoms. She is aware of small right sided renal stones. Suspect this is a MSK issue given her tenderness. As above, she denies any urinary symptoms so unlikely to be pyelonephritis. Will give IM toradol , Rx Naprosyn and robaxin. PCP follow up, RTED for any other concerns.   [CS]    Clinical Course User Index [CS] [name], MD     MDM Rules/Calculators/A&P Medical Decision Making Problems Addressed: Acute left-sided low back pain without sciatica: acute illness or injury  Amount and/or Complexity of Data Reviewed Labs: ordered. Decision-making details documented in ED Course. Radiology: ordered and independent interpretation performed. Decision-making details documented in ED Course.  Risk Prescription drug management. Parenteral controlled substances.     Final Clinical Impression(s) / ED Diagnoses Final diagnoses:  Acute left-sided low back pain without sciatica    Rx / DC Orders ED Discharge Orders          Ordered    naproxen (NAPROSYN) 500 MG tablet  2 times daily        01/27/24 0213    methocarbamol (ROBAXIN) 500 MG tablet  2 times daily        01/27/24 0213             [name], MD 01/27/24 [phone]

## 2024-02-06 ENCOUNTER — Ambulatory Visit
Admission: RE | Admit: 2024-02-06 | Discharge: 2024-02-06 | Disposition: A | Source: Ambulatory Visit | Attending: Internal Medicine | Admitting: Internal Medicine

## 2024-02-06 ENCOUNTER — Encounter: Payer: Self-pay | Admitting: Internal Medicine

## 2024-02-06 ENCOUNTER — Ambulatory Visit: Admitting: Internal Medicine

## 2024-02-06 VITALS — BP 168/110 | HR 87 | Ht 62.5 in | Wt 135.6 lb

## 2024-02-06 DIAGNOSIS — J441 Chronic obstructive pulmonary disease with (acute) exacerbation: Secondary | ICD-10-CM

## 2024-02-06 DIAGNOSIS — I10 Essential (primary) hypertension: Secondary | ICD-10-CM

## 2024-02-06 MED ORDER — PREDNISONE 20 MG PO TABS: 40.0000 mg | ORAL_TABLET | Freq: Every day | ORAL | 0 refills | Status: DC

## 2024-02-06 MED ORDER — SPIRIVA RESPIMAT 2.5 MCG/ACT IN AERS: 2.0000 | INHALATION_SPRAY | Freq: Every day | RESPIRATORY_TRACT | 2 refills | Status: AC
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
## 2024-02-07 ENCOUNTER — Telehealth: Payer: Self-pay

## 2024-02-07 ENCOUNTER — Encounter (HOSPITAL_COMMUNITY): Payer: Self-pay | Admitting: Emergency Medicine

## 2024-02-07 ENCOUNTER — Inpatient Hospital Stay (HOSPITAL_COMMUNITY): Admission: EM | Disposition: A | Payer: Self-pay | Source: Home / Self Care | Attending: Internal Medicine

## 2024-02-07 ENCOUNTER — Emergency Department (HOSPITAL_COMMUNITY)

## 2024-02-07 ENCOUNTER — Other Ambulatory Visit: Payer: Self-pay

## 2024-02-07 ENCOUNTER — Inpatient Hospital Stay (HOSPITAL_COMMUNITY)
Admission: EM | Admit: 2024-02-07 | Discharge: 2024-02-14 | DRG: 286 | Disposition: A | Discharge status: Home / Self Care | Attending: Internal Medicine | Admitting: Internal Medicine

## 2024-02-07 DIAGNOSIS — Z8673 Personal history of transient ischemic attack (TIA), and cerebral infarction without residual deficits: Secondary | ICD-10-CM

## 2024-02-07 DIAGNOSIS — K921 Melena: Secondary | ICD-10-CM

## 2024-02-07 DIAGNOSIS — E872 Acidosis, unspecified: Secondary | ICD-10-CM | POA: Diagnosis present

## 2024-02-07 DIAGNOSIS — I213 ST elevation (STEMI) myocardial infarction of unspecified site: Secondary | ICD-10-CM | POA: Diagnosis present

## 2024-02-07 DIAGNOSIS — Z720 Tobacco use: Secondary | ICD-10-CM | POA: Diagnosis not present

## 2024-02-07 DIAGNOSIS — Z91148 Patient's other noncompliance with medication regimen for other reason: Secondary | ICD-10-CM

## 2024-02-07 DIAGNOSIS — K259 Gastric ulcer, unspecified as acute or chronic, without hemorrhage or perforation: Secondary | ICD-10-CM | POA: Diagnosis not present

## 2024-02-07 DIAGNOSIS — I959 Hypotension, unspecified: Secondary | ICD-10-CM | POA: Diagnosis not present

## 2024-02-07 DIAGNOSIS — R109 Unspecified abdominal pain: Secondary | ICD-10-CM | POA: Diagnosis present

## 2024-02-07 DIAGNOSIS — K3189 Other diseases of stomach and duodenum: Secondary | ICD-10-CM | POA: Diagnosis present

## 2024-02-07 DIAGNOSIS — F319 Bipolar disorder, unspecified: Secondary | ICD-10-CM | POA: Diagnosis present

## 2024-02-07 DIAGNOSIS — I10 Essential (primary) hypertension: Secondary | ICD-10-CM | POA: Diagnosis present

## 2024-02-07 DIAGNOSIS — Z8544 Personal history of malignant neoplasm of other female genital organs: Secondary | ICD-10-CM

## 2024-02-07 DIAGNOSIS — B182 Chronic viral hepatitis C: Secondary | ICD-10-CM | POA: Diagnosis present

## 2024-02-07 DIAGNOSIS — E874 Mixed disorder of acid-base balance: Secondary | ICD-10-CM | POA: Diagnosis present

## 2024-02-07 DIAGNOSIS — Z7982 Long term (current) use of aspirin: Secondary | ICD-10-CM

## 2024-02-07 DIAGNOSIS — R1013 Epigastric pain: Secondary | ICD-10-CM | POA: Diagnosis present

## 2024-02-07 DIAGNOSIS — Z860101 Personal history of adenomatous and serrated colon polyps: Secondary | ICD-10-CM

## 2024-02-07 DIAGNOSIS — I700 Atherosclerosis of aorta: Secondary | ICD-10-CM | POA: Diagnosis not present

## 2024-02-07 DIAGNOSIS — F17210 Nicotine dependence, cigarettes, uncomplicated: Secondary | ICD-10-CM | POA: Diagnosis present

## 2024-02-07 DIAGNOSIS — B338 Other specified viral diseases: Secondary | ICD-10-CM

## 2024-02-07 DIAGNOSIS — K279 Peptic ulcer, site unspecified, unspecified as acute or chronic, without hemorrhage or perforation: Secondary | ICD-10-CM | POA: Diagnosis not present

## 2024-02-07 DIAGNOSIS — K2960 Other gastritis without bleeding: Secondary | ICD-10-CM | POA: Diagnosis not present

## 2024-02-07 DIAGNOSIS — I2510 Atherosclerotic heart disease of native coronary artery without angina pectoris: Secondary | ICD-10-CM | POA: Diagnosis present

## 2024-02-07 DIAGNOSIS — K254 Chronic or unspecified gastric ulcer with hemorrhage: Secondary | ICD-10-CM | POA: Diagnosis present

## 2024-02-07 DIAGNOSIS — I110 Hypertensive heart disease with heart failure: Secondary | ICD-10-CM | POA: Diagnosis present

## 2024-02-07 DIAGNOSIS — Q399 Congenital malformation of esophagus, unspecified: Secondary | ICD-10-CM | POA: Diagnosis not present

## 2024-02-07 DIAGNOSIS — J4489 Other specified chronic obstructive pulmonary disease: Secondary | ICD-10-CM | POA: Diagnosis present

## 2024-02-07 DIAGNOSIS — I340 Nonrheumatic mitral (valve) insufficiency: Secondary | ICD-10-CM | POA: Diagnosis present

## 2024-02-07 DIAGNOSIS — Z8249 Family history of ischemic heart disease and other diseases of the circulatory system: Secondary | ICD-10-CM | POA: Diagnosis not present

## 2024-02-07 DIAGNOSIS — I252 Old myocardial infarction: Secondary | ICD-10-CM

## 2024-02-07 DIAGNOSIS — Z923 Personal history of irradiation: Secondary | ICD-10-CM

## 2024-02-07 DIAGNOSIS — D72829 Elevated white blood cell count, unspecified: Secondary | ICD-10-CM | POA: Diagnosis present

## 2024-02-07 DIAGNOSIS — I2489 Other forms of acute ischemic heart disease: Secondary | ICD-10-CM | POA: Diagnosis present

## 2024-02-07 DIAGNOSIS — Z79899 Other long term (current) drug therapy: Secondary | ICD-10-CM

## 2024-02-07 DIAGNOSIS — I509 Heart failure, unspecified: Secondary | ICD-10-CM | POA: Diagnosis not present

## 2024-02-07 DIAGNOSIS — Z91411 Personal history of adult psychological abuse: Secondary | ICD-10-CM

## 2024-02-07 DIAGNOSIS — N200 Calculus of kidney: Secondary | ICD-10-CM | POA: Diagnosis present

## 2024-02-07 DIAGNOSIS — I25119 Atherosclerotic heart disease of native coronary artery with unspecified angina pectoris: Secondary | ICD-10-CM | POA: Diagnosis not present

## 2024-02-07 DIAGNOSIS — I739 Peripheral vascular disease, unspecified: Secondary | ICD-10-CM | POA: Diagnosis present

## 2024-02-07 DIAGNOSIS — R7989 Other specified abnormal findings of blood chemistry: Secondary | ICD-10-CM | POA: Diagnosis present

## 2024-02-07 DIAGNOSIS — Z803 Family history of malignant neoplasm of breast: Secondary | ICD-10-CM

## 2024-02-07 DIAGNOSIS — I5031 Acute diastolic (congestive) heart failure: Secondary | ICD-10-CM | POA: Diagnosis present

## 2024-02-07 DIAGNOSIS — B974 Respiratory syncytial virus as the cause of diseases classified elsewhere: Secondary | ICD-10-CM | POA: Diagnosis present

## 2024-02-07 DIAGNOSIS — Z955 Presence of coronary angioplasty implant and graft: Secondary | ICD-10-CM

## 2024-02-07 DIAGNOSIS — R1084 Generalized abdominal pain: Secondary | ICD-10-CM | POA: Diagnosis not present

## 2024-02-07 DIAGNOSIS — K625 Hemorrhage of anus and rectum: Secondary | ICD-10-CM | POA: Diagnosis not present

## 2024-02-07 DIAGNOSIS — K219 Gastro-esophageal reflux disease without esophagitis: Secondary | ICD-10-CM | POA: Diagnosis not present

## 2024-02-07 DIAGNOSIS — E785 Hyperlipidemia, unspecified: Secondary | ICD-10-CM | POA: Diagnosis present

## 2024-02-07 DIAGNOSIS — Z8619 Personal history of other infectious and parasitic diseases: Secondary | ICD-10-CM | POA: Diagnosis not present

## 2024-02-07 DIAGNOSIS — Z87442 Personal history of urinary calculi: Secondary | ICD-10-CM

## 2024-02-07 DIAGNOSIS — F209 Schizophrenia, unspecified: Secondary | ICD-10-CM | POA: Diagnosis present

## 2024-02-07 HISTORY — PX: CORONARY/GRAFT ACUTE MI REVASCULARIZATION: CATH118305

## 2024-02-07 HISTORY — PX: LEFT HEART CATH AND CORONARY ANGIOGRAPHY: CATH118249

## 2024-02-07 LAB — CBC WITH DIFFERENTIAL/PLATELET
Abs Immature Granulocytes: 0.05 K/uL (ref 0.00–0.07)
Basophils Absolute: 0.1 K/uL (ref 0.0–0.1)
Basophils Relative: 1 %
Eosinophils Absolute: 0.1 K/uL (ref 0.0–0.5)
Eosinophils Relative: 1 %
HCT: 49.7 % — ABNORMAL HIGH (ref 36.0–46.0)
Hemoglobin: 17.2 g/dL — ABNORMAL HIGH (ref 12.0–15.0)
Immature Granulocytes: 0 %
Lymphocytes Relative: 39 %
Lymphs Abs: 4.6 K/uL — ABNORMAL HIGH (ref 0.7–4.0)
MCH: 30.2 pg (ref 26.0–34.0)
MCHC: 34.6 g/dL (ref 30.0–36.0)
MCV: 87.2 fL (ref 80.0–100.0)
Monocytes Absolute: 1 K/uL (ref 0.1–1.0)
Monocytes Relative: 9 %
Neutro Abs: 5.9 K/uL (ref 1.7–7.7)
Neutrophils Relative %: 50 %
Platelets: 460 K/uL — ABNORMAL HIGH (ref 150–400)
RBC: 5.7 MIL/uL — ABNORMAL HIGH (ref 3.87–5.11)
RDW: 13.5 % (ref 11.5–15.5)
WBC: 11.7 K/uL — ABNORMAL HIGH (ref 4.0–10.5)
nRBC: 0 % (ref 0.0–0.2)

## 2024-02-07 LAB — BLOOD GAS, VENOUS
Acid-Base Excess: 0.7 mmol/L (ref 0.0–2.0)
Bicarbonate: 19.7 mmol/L — ABNORMAL LOW (ref 20.0–28.0)
Drawn by: 66297
O2 Saturation: 93.9 %
Patient temperature: 36.9
pCO2, Ven: 21 mmHg — ABNORMAL LOW (ref 44–60)
pH, Ven: 7.58 — ABNORMAL HIGH (ref 7.25–7.43)
pO2, Ven: 60 mmHg — ABNORMAL HIGH (ref 32–45)

## 2024-02-07 LAB — CBC
HCT: 47.5 % — ABNORMAL HIGH (ref 36.0–46.0)
Hemoglobin: 15.8 g/dL — ABNORMAL HIGH (ref 12.0–15.0)
MCH: 29.6 pg (ref 26.0–34.0)
MCHC: 33.3 g/dL (ref 30.0–36.0)
MCV: 89 fL (ref 80.0–100.0)
Platelets: 408 K/uL — ABNORMAL HIGH (ref 150–400)
RBC: 5.34 MIL/uL — ABNORMAL HIGH (ref 3.87–5.11)
RDW: 13.3 % (ref 11.5–15.5)
WBC: 9.7 K/uL (ref 4.0–10.5)
nRBC: 0 % (ref 0.0–0.2)

## 2024-02-07 LAB — COMPREHENSIVE METABOLIC PANEL WITH GFR
ALT: 12 U/L (ref 0–44)
AST: 22 U/L (ref 15–41)
Albumin: 4.2 g/dL (ref 3.5–5.0)
Alkaline Phosphatase: 121 U/L (ref 38–126)
Anion gap: 17 — ABNORMAL HIGH (ref 5–15)
BUN: 28 mg/dL — ABNORMAL HIGH (ref 6–20)
CO2: 23 mmol/L (ref 22–32)
Calcium: 10.2 mg/dL (ref 8.9–10.3)
Chloride: 99 mmol/L (ref 98–111)
Creatinine, Ser: 1.4 mg/dL — ABNORMAL HIGH (ref 0.44–1.00)
GFR, Estimated: 44 mL/min — ABNORMAL LOW (ref >60)
Glucose, Bld: 135 mg/dL — ABNORMAL HIGH (ref 70–99)
Potassium: 4.3 mmol/L (ref 3.5–5.1)
Sodium: 139 mmol/L (ref 135–145)
Total Bilirubin: 0.3 mg/dL (ref 0.0–1.2)
Total Protein: 8.8 g/dL — ABNORMAL HIGH (ref 6.5–8.1)

## 2024-02-07 LAB — LIPID PANEL
Cholesterol: 239 mg/dL — ABNORMAL HIGH (ref 0–200)
HDL: 35 mg/dL — ABNORMAL LOW (ref >40)
LDL Cholesterol: 173 mg/dL — ABNORMAL HIGH (ref 0–99)
Total CHOL/HDL Ratio: 6.8 ratio
Triglycerides: 154 mg/dL — ABNORMAL HIGH (ref <150)
VLDL: 31 mg/dL (ref 0–40)

## 2024-02-07 LAB — TROPONIN T, HIGH SENSITIVITY: Troponin T High Sensitivity: 32 ng/L — ABNORMAL HIGH (ref 0–19)

## 2024-02-07 LAB — MAGNESIUM: Magnesium: 2.2 mg/dL (ref 1.7–2.4)

## 2024-02-07 LAB — TROPONIN I (HIGH SENSITIVITY): Troponin I (High Sensitivity): 50 ng/L — ABNORMAL HIGH (ref <18)

## 2024-02-07 LAB — APTT: aPTT: 29 s (ref 24–36)

## 2024-02-07 LAB — PROTIME-INR
INR: 0.9 (ref 0.8–1.2)
Prothrombin Time: 13 s (ref 11.4–15.2)

## 2024-02-07 LAB — LACTIC ACID, PLASMA: Lactic Acid, Venous: 1.7 mmol/L (ref 0.5–1.9)

## 2024-02-07 LAB — CREATININE, SERUM
Creatinine, Ser: 1.48 mg/dL — ABNORMAL HIGH (ref 0.44–1.00)
GFR, Estimated: 41 mL/min — ABNORMAL LOW (ref >60)

## 2024-02-07 LAB — RESP PANEL BY RT-PCR (RSV, FLU A&B, COVID)  RVPGX2
Influenza A by PCR: NEGATIVE
Influenza B by PCR: NEGATIVE
Resp Syncytial Virus by PCR: POSITIVE — AB
SARS Coronavirus 2 by RT PCR: NEGATIVE

## 2024-02-07 LAB — PRO BRAIN NATRIURETIC PEPTIDE: Pro Brain Natriuretic Peptide: >35000 pg/mL — ABNORMAL HIGH (ref <300.0)

## 2024-02-07 SURGERY — CORONARY/GRAFT ACUTE MI REVASCULARIZATION
Anesthesia: LOCAL

## 2024-02-07 MED ORDER — MIDAZOLAM HCL (PF) 2 MG/2ML IJ SOLN
INTRAMUSCULAR | Status: DC | PRN
Start: 2024-02-07 — End: 2024-02-07
  Administered 2024-02-07: 1 mg via INTRAVENOUS

## 2024-02-07 MED ORDER — NITROGLYCERIN 0.4 MG SL SUBL: 0.4000 mg | SUBLINGUAL_TABLET | SUBLINGUAL | Status: DC | PRN

## 2024-02-07 MED ORDER — TIOTROPIUM BROMIDE 2.5 MCG/ACT IN AERS: 2.0000 | INHALATION_SPRAY | Freq: Every day | RESPIRATORY_TRACT | Status: DC

## 2024-02-07 MED ORDER — LABETALOL HCL 5 MG/ML IV SOLN
INTRAVENOUS | Status: AC
  Filled 2024-02-07: qty 4

## 2024-02-07 MED ORDER — SODIUM CHLORIDE 0.9 % IV SOLN: 250.0000 mL | INTRAVENOUS | Status: AC | PRN

## 2024-02-07 MED ORDER — FENTANYL CITRATE (PF) 100 MCG/2ML IJ SOLN
INTRAMUSCULAR | Status: AC
  Filled 2024-02-07: qty 2

## 2024-02-07 MED ORDER — NITROGLYCERIN 2 % TD OINT
1.0000 [in_us] | TOPICAL_OINTMENT | Freq: Once | TRANSDERMAL | Status: AC
  Administered 2024-02-07: 1 [in_us] via TOPICAL
  Filled 2024-02-07: qty 1

## 2024-02-07 MED ORDER — GABAPENTIN 400 MG PO CAPS
400.0000 mg | ORAL_CAPSULE | Freq: Three times a day (TID) | ORAL | Status: DC
  Administered 2024-02-07 – 2024-02-14 (×20): 400 mg via ORAL
  Filled 2024-02-07 (×20): qty 1

## 2024-02-07 MED ORDER — UMECLIDINIUM BROMIDE 62.5 MCG/ACT IN AEPB
1.0000 | INHALATION_SPRAY | Freq: Every day | RESPIRATORY_TRACT | Status: DC
  Administered 2024-02-08 – 2024-02-14 (×6): 1 via RESPIRATORY_TRACT
  Filled 2024-02-07 (×3): qty 7

## 2024-02-07 MED ORDER — SODIUM CHLORIDE 0.9% FLUSH: 3.0000 mL | INTRAVENOUS | Status: DC | PRN

## 2024-02-07 MED ORDER — HEPARIN SODIUM (PORCINE) 5000 UNIT/ML IJ SOLN
60.0000 [IU]/kg | Freq: Once | INTRAMUSCULAR | Status: AC
  Administered 2024-02-07: 3700 [IU] via INTRAVENOUS
  Filled 2024-02-07: qty 1

## 2024-02-07 MED ORDER — ASPIRIN 81 MG PO TBEC
81.0000 mg | DELAYED_RELEASE_TABLET | Freq: Every day | ORAL | Status: DC
  Administered 2024-02-08 – 2024-02-14 (×7): 81 mg via ORAL
  Filled 2024-02-07 (×7): qty 1

## 2024-02-07 MED ORDER — NITROGLYCERIN 2 % TD OINT
1.0000 [in_us] | TOPICAL_OINTMENT | Freq: Once | TRANSDERMAL | Status: AC
  Administered 2024-02-07: 1 [in_us] via TOPICAL

## 2024-02-07 MED ORDER — VERAPAMIL HCL 2.5 MG/ML IV SOLN
INTRAVENOUS | Status: AC
  Filled 2024-02-07: qty 2

## 2024-02-07 MED ORDER — HEPARIN SODIUM (PORCINE) 1000 UNIT/ML IJ SOLN
INTRAMUSCULAR | Status: AC
  Filled 2024-02-07: qty 10

## 2024-02-07 MED ORDER — HYDRALAZINE HCL 20 MG/ML IJ SOLN: 10.0000 mg | INTRAMUSCULAR | Status: AC | PRN

## 2024-02-07 MED ORDER — FENTANYL CITRATE (PF) 100 MCG/2ML IJ SOLN
INTRAMUSCULAR | Status: DC | PRN
  Administered 2024-02-07: 25 ug via INTRAVENOUS

## 2024-02-07 MED ORDER — ONDANSETRON 4 MG PO TBDP: 4.0000 mg | ORAL_TABLET | Freq: Three times a day (TID) | ORAL | Status: DC | PRN

## 2024-02-07 MED ORDER — ALBUTEROL SULFATE (2.5 MG/3ML) 0.083% IN NEBU
2.5000 mg | INHALATION_SOLUTION | Freq: Four times a day (QID) | RESPIRATORY_TRACT | Status: DC | PRN
Start: 2024-02-07 — End: 2024-02-14

## 2024-02-07 MED ORDER — LIDOCAINE HCL (PF) 1 % IJ SOLN
INTRAMUSCULAR | Status: AC
  Filled 2024-02-07: qty 30

## 2024-02-07 MED ORDER — LIDOCAINE HCL (PF) 1 % IJ SOLN
INTRAMUSCULAR | Status: DC | PRN
  Administered 2024-02-07: 5 mL via INTRADERMAL

## 2024-02-07 MED ORDER — ALPRAZOLAM 0.5 MG PO TABS
1.0000 mg | ORAL_TABLET | Freq: Once | ORAL | Status: AC
  Administered 2024-02-07: 1 mg via ORAL
  Filled 2024-02-07: qty 2

## 2024-02-07 MED ORDER — SODIUM CHLORIDE 0.9 % IV SOLN: INTRAVENOUS | Status: AC

## 2024-02-07 MED ORDER — FREE WATER
500.0000 mL | Freq: Once | Status: AC
  Administered 2024-02-07: 500 mL via ORAL

## 2024-02-07 MED ORDER — ACETAMINOPHEN 325 MG PO TABS
650.0000 mg | ORAL_TABLET | ORAL | Status: DC | PRN
  Administered 2024-02-08 – 2024-02-09 (×3): 650 mg via ORAL
  Filled 2024-02-07 (×3): qty 2

## 2024-02-07 MED ORDER — HEPARIN (PORCINE) IN NACL 1000-0.9 UT/500ML-% IV SOLN
INTRAVENOUS | Status: DC | PRN
  Administered 2024-02-07 (×2): 500 mL

## 2024-02-07 MED ORDER — IOHEXOL 350 MG/ML SOLN
INTRAVENOUS | Status: DC | PRN
  Administered 2024-02-07: 30 mL

## 2024-02-07 MED ORDER — PANTOPRAZOLE SODIUM 40 MG PO TBEC
40.0000 mg | DELAYED_RELEASE_TABLET | Freq: Every day | ORAL | Status: DC
  Administered 2024-02-08 – 2024-02-09 (×2): 40 mg via ORAL
  Filled 2024-02-07 (×2): qty 1

## 2024-02-07 MED ORDER — HYDROMORPHONE HCL 1 MG/ML IJ SOLN
1.0000 mg | Freq: Once | INTRAMUSCULAR | Status: AC
  Administered 2024-02-07: 1 mg via INTRAVENOUS
  Filled 2024-02-07: qty 1

## 2024-02-07 MED ORDER — LABETALOL HCL 5 MG/ML IV SOLN
10.0000 mg | Freq: Once | INTRAVENOUS | Status: AC
  Administered 2024-02-07: 10 mg via INTRAVENOUS

## 2024-02-07 MED ORDER — MIDAZOLAM HCL 2 MG/2ML IJ SOLN
INTRAMUSCULAR | Status: AC
  Filled 2024-02-07: qty 2

## 2024-02-07 MED ORDER — ENOXAPARIN SODIUM 40 MG/0.4ML IJ SOSY
40.0000 mg | PREFILLED_SYRINGE | INTRAMUSCULAR | Status: DC
  Administered 2024-02-08 – 2024-02-09 (×2): 40 mg via SUBCUTANEOUS
  Filled 2024-02-07 (×2): qty 0.4

## 2024-02-07 MED ORDER — ONDANSETRON HCL 4 MG/2ML IJ SOLN
4.0000 mg | Freq: Four times a day (QID) | INTRAMUSCULAR | Status: DC | PRN
Start: 2024-02-07 — End: 2024-02-14
  Administered 2024-02-08 – 2024-02-14 (×9): 4 mg via INTRAVENOUS
  Filled 2024-02-07 (×9): qty 2

## 2024-02-07 MED ORDER — NITROGLYCERIN IN D5W 200-5 MCG/ML-% IV SOLN
5.0000 ug/min | INTRAVENOUS | Status: DC
  Filled 2024-02-07: qty 250

## 2024-02-07 MED ORDER — SODIUM CHLORIDE 0.9% FLUSH
3.0000 mL | Freq: Two times a day (BID) | INTRAVENOUS | Status: DC
  Administered 2024-02-07 – 2024-02-14 (×13): 3 mL via INTRAVENOUS

## 2024-02-07 MED ORDER — ASPIRIN 81 MG PO CHEW
324.0000 mg | CHEWABLE_TABLET | Freq: Once | ORAL | Status: AC
  Administered 2024-02-07: 324 mg via ORAL
  Filled 2024-02-07: qty 4

## 2024-02-07 SURGICAL SUPPLY — 11 items
CATH INFINITI 5FR MULTPACK ANG (CATHETERS) IMPLANT
CLOSURE MYNX CONTROL 5F (Vascular Products) IMPLANT
GLIDESHEATH SLEND A-KIT 6F 22G (SHEATH) IMPLANT
GUIDEWIRE INQWIRE 1.5J.035X260 (WIRE) IMPLANT
KIT MICROPUNCTURE NIT STIFF (SHEATH) IMPLANT
KIT SINGLE USE MANIFOLD (KITS) IMPLANT
KIT SYRINGE INJ CVI SPIKEX1 (MISCELLANEOUS) IMPLANT
PACK CARDIAC CATHETERIZATION (CUSTOM PROCEDURE TRAY) ×1 IMPLANT
SET ATX-X65L (MISCELLANEOUS) IMPLANT
SHEATH PINNACLE 5F 10CM (SHEATH) IMPLANT
WIRE HI TORQ VERSACORE-J 145CM (WIRE) IMPLANT
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
## 2024-02-07 NOTE — Progress Notes (Signed)
 eLink Physician-Brief Progress Note Patient Name: Carol Henderson DOB: May 22, 1965 MRN: [id number]   Date of Service  02/07/2024  HPI/Events of Note  58 y.o. female with a history of coronary artery disease status post DES to RCA + DES to Cx in 2020, COPD, asthma, CVA, and tobacco use who is being seen 02/07/2024 for STEMI status post PCI to the RCA and circumflex.    Patient is hypertensive but otherwise normal vitals saturating 92% on 2 L of oxygen.  Patient is alkalemia,, elevated creatinine, anion gap metabolic acidosis, BNP and troponin are elevated.  +RSV.  Radiograph unremarkable.  eICU Interventions  Maintain aspirin , potential staged PCI, echo pending  Antihypertensives and goal-directed medical therapy per cardiology  DVT prophylaxis with enoxaparin  GI prophylaxis with home PPI     Intervention Category Evaluation Type: New Patient Evaluation  [name] 02/07/2024, 10:55 PM

## 2024-02-07 NOTE — ED Provider Notes (Signed)
 [place] EMERGENCY DEPARTMENT AT [facility] Provider Note   CSN: [id number] Arrival date & time: 02/07/24  8069     Patient presents with: Shortness of Breath   Carol Henderson is a 58 y.o. female.    Shortness of Breath Associated symptoms: chest pain, cough and fever   Patient presents for shortness of breath.  She has had flulike symptoms over the past 3 days.  This has included cough, congestion, shortness of breath, diarrhea, fever, generalized weakness.  She describes a red blood present in her diarrhea.  She is seen by her PCP yesterday and started on prednisone.  She had worsening symptoms today.  EMS noted tachycardia, tachypnea, and hypertension.  She was given 2 DuoNebs and 125 mg of Solu-Medrol  prior to arrival.  On arrival, patient describes a epigastric pain that has been present over the past 3 days that migrated into her substernal area tonight.  This occurred at approximately 1 hour prior to arrival..      Prior to Admission medications   Medication Sig Start Date End Date Taking? Authorizing Provider  albuterol  (VENTOLIN  HFA) 108 (90 Base) MCG/ACT inhaler Inhale 2 puffs into the lungs every 6 (six) hours as needed for wheezing or shortness of breath. 01/10/24   [name], MD  ALPRAZolam  (XANAX ) 1 MG tablet Take 1 mg by mouth once. 01/07/24   [provider]  amLODipine  (NORVASC ) 10 MG tablet Take 1 tablet (10 mg total) by mouth daily. Patient not taking: Reported on 02/06/2024 10/21/23   [name], MD  carvedilol  (COREG ) 3.125 MG tablet Take 1 tablet (3.125 mg total) by mouth 2 (two) times daily with a meal. Patient not taking: Reported on 02/06/2024 10/21/23   [name], MD  cloNIDine  (CATAPRES  - DOSED IN MG/24 HR) 0.2 mg/24hr patch Place 0.2 mg onto the skin once a week.    [provider]  cloNIDine  (CATAPRES ) 0.2 MG tablet Take 0.2 mg by mouth 2 (two) times daily. 01/04/24   [provider]  gabapentin  (NEURONTIN )  400 MG capsule Take 1 capsule (400 mg total) by mouth 3 (three) times daily. Reported on 07/02/2015 10/21/23   [name], MD  labetalol  (NORMODYNE ) 100 MG tablet Take 100 mg by mouth 2 (two) times daily.    [provider]  losartan  (COZAAR ) 100 MG tablet Take 1 tablet (100 mg total) by mouth daily. Patient not taking: Reported on 02/06/2024 10/21/23   [name], MD  methocarbamol (ROBAXIN) 500 MG tablet Take 1 tablet (500 mg total) by mouth 2 (two) times daily. Patient not taking: Reported on 02/06/2024 01/27/24   [name], MD  naproxen (NAPROSYN) 500 MG tablet Take 1 tablet (500 mg total) by mouth 2 (two) times daily. Patient not taking: Reported on 02/06/2024 01/27/24   [name], MD  nitroGLYCERIN  (NITROSTAT ) 0.4 MG SL tablet Place 1 tablet (0.4 mg total) under the tongue every 5 (five) minutes as needed for chest pain. 10/21/23   [name], MD  ondansetron  (ZOFRAN  ODT) 4 MG disintegrating tablet Take 1 tablet (4 mg total) by mouth every 8 (eight) hours as needed for nausea or vomiting. 10/21/23   [name], MD  pantoprazole  (PROTONIX ) 40 MG tablet Take 1 tablet (40 mg total) by mouth daily. 10/21/23   [name], MD  predniSONE (DELTASONE) 20 MG tablet Take 2 tablets (40 mg total) by mouth daily with breakfast for 5 days. 02/06/24 02/11/24  [name], [name], MD  Tiotropium Bromide  (SPIRIVA  RESPIMAT) 2.5 MCG/ACT AERS Inhale 2 puffs into the lungs daily. 02/06/24 05/06/24  [name], MD    Allergies: Ativan  [lorazepam ] and Amitriptyline     Review of Systems  Constitutional:  Positive for activity change, appetite change, fatigue and fever.  HENT:  Positive for congestion.   Respiratory:  Positive for cough, chest tightness and shortness of breath.   Cardiovascular:  Positive for chest pain.  Gastrointestinal:  Positive for blood in stool and diarrhea.  Neurological:  Positive for weakness (Generalized).  All other systems reviewed and are  negative.   Updated Vital Signs BP (!) 175/117   Pulse (!) 103   Temp 98.5 F (36.9 C)   Resp (!) 30   Ht 5' 2.5 (1.588 m)   Wt 61.5 kg   SpO2 96%   BMI 24.40 kg/m   Physical Exam Vitals and nursing note reviewed.  Constitutional:      General: She is not in acute distress.    Appearance: She is well-developed. She is not toxic-appearing or diaphoretic.  HENT:     Head: Normocephalic and atraumatic.     Mouth/Throat:     Mouth: Mucous membranes are moist.  Eyes:     Conjunctiva/sclera: Conjunctivae normal.  Cardiovascular:     Rate and Rhythm: Regular rhythm. Tachycardia present.     Heart sounds: No murmur heard. Pulmonary:     Effort: Pulmonary effort is normal. Tachypnea present. No respiratory distress.     Breath sounds: Wheezing and rhonchi present.  Abdominal:     Palpations: Abdomen is soft.     Tenderness: There is no abdominal tenderness.  Musculoskeletal:        General: No swelling. Normal range of motion.     Cervical back: Normal range of motion and neck supple.     Right lower leg: No edema.     Left lower leg: No edema.  Skin:    General: Skin is warm and dry.     Coloration: Skin is not cyanotic or pale.  Neurological:     General: No focal deficit present.     Mental Status: She is alert and oriented to person, place, and time.  Psychiatric:        Mood and Affect: Mood normal.        Behavior: Behavior normal.     (all labs ordered are listed, but only abnormal results are displayed) Labs Reviewed  COMPREHENSIVE METABOLIC PANEL WITH GFR - Abnormal; Notable for the following components:      Result Value   Glucose, Bld 135 (*)    BUN 28 (*)    Creatinine, Ser 1.40 (*)    Total Protein 8.8 (*)    GFR, Estimated 44 (*)    Anion gap 17 (*)    All other components within normal limits  PRO BRAIN NATRIURETIC PEPTIDE - Abnormal; Notable for the following components:   Pro Brain Natriuretic Peptide >35,000.0 (*)    All other components  within normal limits  CBC WITH DIFFERENTIAL/PLATELET - Abnormal; Notable for the following components:   WBC 11.7 (*)    RBC 5.70 (*)    Hemoglobin 17.2 (*)    HCT 49.7 (*)    Platelets 460 (*)    Lymphs Abs 4.6 (*)    All other components within normal limits  LIPID PANEL - Abnormal; Notable for the following components:   Cholesterol 239 (*)    Triglycerides 154 (*)  HDL 35 (*)    LDL Cholesterol 173 (*)    All other components within normal limits  TROPONIN T, HIGH SENSITIVITY - Abnormal; Notable for the following components:   Troponin T High Sensitivity 32 (*)    All other components within normal limits  RESP PANEL BY RT-PCR (RSV, FLU A&B, COVID)  RVPGX2  MAGNESIUM  PROTIME-INR  APTT  LACTIC ACID, PLASMA  BLOOD GAS, VENOUS  HEMOGLOBIN A1C    EKG: None  Radiology: DG Chest Port 1 View Result Date: 02/07/2024 EXAM: 1 VIEW(S) XRAY OF THE CHEST 02/07/2024 08:09:00 PM COMPARISON: 02/06/2024 CLINICAL HISTORY: Cough FINDINGS: LUNGS AND PLEURA: No focal pulmonary opacity. No pulmonary edema. No pleural effusion. No pneumothorax. HEART AND MEDIASTINUM: No acute abnormality of the cardiac and mediastinal silhouettes. BONES AND SOFT TISSUES: No acute osseous abnormality. IMPRESSION: 1. No acute cardiopulmonary process identified. Electronically signed by: Franky Crease MD 02/07/2024 08:12 PM EST RP Workstation: [id number]     Procedures   Medications Ordered in the ED  0.9 %  sodium chloride  infusion (has no administration in time range)  nitroGLYCERIN  (NITROGLYN) 2 % ointment 1 inch (has no administration in time range)  labetalol  (NORMODYNE ) 5 MG/ML injection (has no administration in time range)  nitroGLYCERIN  (NITROGLYN) 2 % ointment 1 inch (1 inch Topical Given 02/07/24 1957)  aspirin  chewable tablet 324 mg (324 mg Oral Given 02/07/24 1957)  heparin  injection 3,700 Units (3,700 Units Intravenous Given 02/07/24 2007)  HYDROmorphone  (DILAUDID ) injection 1 mg (1 mg Intravenous  Given 02/07/24 2030)  labetalol  (NORMODYNE ) injection 10 mg (10 mg Intravenous Given 02/07/24 2032)                                    Medical Decision Making Amount and/or Complexity of Data Reviewed Labs: ordered. Radiology: ordered.  Risk OTC drugs. Prescription drug management.   This patient presents to the ED for concern of shortness of breath and chest pain, this involves an extensive number of treatment options, and is a complaint that carries with it a high risk of complications and morbidity.  The differential diagnosis includes COPD, pneumonia, CHF, ACS   Co morbidities / Chronic conditions that complicate the patient evaluation  HTN, CAD, COPD, GERD, opioid dependence, anxiety, substance abuse   Additional history obtained:  Additional history obtained from EMR External records from outside source obtained and reviewed including EMS   Lab Tests:  I Ordered, and personally interpreted labs.  The pertinent results include: Mild leukocytosis is present.  Creatinine is slightly increased at baseline.  Electrolytes are normal.  Initial troponin slightly elevated.  BNP is markedly elevated.   Imaging Studies ordered:  I ordered imaging studies including chest x-ray I independently visualized and interpreted imaging which showed no acute findings I agree with the radiologist interpretation   Cardiac Monitoring: / EKG:  The patient was maintained on a cardiac monitor.  I personally viewed and interpreted the cardiac monitored which showed an underlying rhythm of: Sinus rhythm   Problem List / ED Course / Critical interventions / Medication management  Patient presenting for progression of flulike symptoms over the past 3 days.  Seen by PCP yesterday and prescribed prednisone.  She has worsened since that time.  On arrival, patient is awake and alert.  Vital signs are notable for tachycardia, tachypnea, and hypertension.  She does have mildly increased work of  breathing.  She is able to speak  in complete sentences.  On lung auscultation, patient has wheezing and rhonchi present.  She did receive DuoNebs and Solu-Medrol  prior to arrival.  EKG on arrival shows inferior ST elevation with reciprocal change.  Code STEMI was initiated.  Aspirin  and heparin  were ordered.  Patient's blood pressure treated with NTG ointment and labetalol .  Dilaudid  was ordered for analgesia.  I spoke with STEMI doctor on-call, Dr. Elmira, who reviewed EKG and agrees with code STEMI activation.  Given absence of [facility] trucks available, [facility] called.  Patient to be transported immediately to Cath Lab at [facility].  Initial troponin was mildly elevated.  BNP markedly elevated.  Patient was transported for further management. I ordered medication including ASA, heparin , NTG, Dilaudid  for chest pain. Reevaluation of the patient after these medicines showed that the patient improved I have reviewed the patients home medicines and have made adjustments as needed   Consultations Obtained:  I requested consultation with the cardiologist, Dr. Elmira,  and discussed lab and imaging findings as well as pertinent plan - they recommend: STEMI activation and transport   Social Determinants of Health:  Lives independently  CRITICAL CARE Performed by: Bernardino Fireman   Total critical care time: 32 minutes  Critical care time was exclusive of separately billable procedures and treating other patients.  Critical care was necessary to treat or prevent imminent or life-threatening deterioration.  Critical care was time spent personally by me on the following activities: development of treatment plan with patient and/or surrogate as well as nursing, discussions with consultants, evaluation of patient's response to treatment, examination of patient, obtaining history from patient or surrogate, ordering and performing treatments and interventions, ordering and review of  laboratory studies, ordering and review of radiographic studies, pulse oximetry and re-evaluation of patient's condition.      Final diagnoses:  ST elevation myocardial infarction (STEMI), unspecified artery [facility])    ED Discharge Orders     None          Fireman Bernardino, MD 02/07/24 2038

## 2024-02-07 NOTE — ED Notes (Addendum)
 Called [facility] for Code STEMI @ 2003   Called EMS for transport @ 2011  EMS left for [facility] @ 2037

## 2024-02-07 NOTE — Telephone Encounter (Signed)
 Patient sister called back again was very rude on the phone demanding to have the results  of her sisters xray, she was advised that once we get them back we would call and let them know. She asked how long does it take to get some results back and she was informed that it could possibly take 24-48 hours sometimes longer it just depends and that the provider just finished with patients and she said well he better put some lead in it and get with it she was informed that since he just finished if they were back wed let them know just as soon as we got them back. Pts sister was asked to be patient  and not be rude

## 2024-02-07 NOTE — ED Triage Notes (Signed)
 Pt c/o sob for a couple of days, seen pcp for same yesterday and had chest xray. Pt also c/o weakness and diarrhea. Ems gave pt 125mg  of solumedrol.

## 2024-02-07 NOTE — Telephone Encounter (Signed)
 Pt sister LM asking for call back about her chest xray she had done yesterday states pt is very sick, need to check and see of its back yet

## 2024-02-08 ENCOUNTER — Encounter (HOSPITAL_COMMUNITY): Payer: Self-pay | Admitting: Cardiology

## 2024-02-08 ENCOUNTER — Ambulatory Visit: Payer: Self-pay | Admitting: Internal Medicine

## 2024-02-08 ENCOUNTER — Inpatient Hospital Stay (HOSPITAL_COMMUNITY)

## 2024-02-08 DIAGNOSIS — I2489 Other forms of acute ischemic heart disease: Secondary | ICD-10-CM

## 2024-02-08 LAB — HEMOGLOBIN A1C
Hgb A1c MFr Bld: 5.1 % (ref 4.8–5.6)
Mean Plasma Glucose: 99.67 mg/dL

## 2024-02-08 LAB — BASIC METABOLIC PANEL WITH GFR
Anion gap: 14 (ref 5–15)
BUN: 35 mg/dL — ABNORMAL HIGH (ref 6–20)
CO2: 21 mmol/L — ABNORMAL LOW (ref 22–32)
Calcium: 9.1 mg/dL (ref 8.9–10.3)
Chloride: 101 mmol/L (ref 98–111)
Creatinine, Ser: 1.55 mg/dL — ABNORMAL HIGH (ref 0.44–1.00)
GFR, Estimated: 39 mL/min — ABNORMAL LOW (ref >60)
Glucose, Bld: 148 mg/dL — ABNORMAL HIGH (ref 70–99)
Potassium: 4.1 mmol/L (ref 3.5–5.1)
Sodium: 136 mmol/L (ref 135–145)

## 2024-02-08 LAB — CBC
HCT: 46.2 % — ABNORMAL HIGH (ref 36.0–46.0)
Hemoglobin: 15.3 g/dL — ABNORMAL HIGH (ref 12.0–15.0)
MCH: 29.8 pg (ref 26.0–34.0)
MCHC: 33.1 g/dL (ref 30.0–36.0)
MCV: 90.1 fL (ref 80.0–100.0)
Platelets: 403 K/uL — ABNORMAL HIGH (ref 150–400)
RBC: 5.13 MIL/uL — ABNORMAL HIGH (ref 3.87–5.11)
RDW: 13.2 % (ref 11.5–15.5)
WBC: 6.2 K/uL (ref 4.0–10.5)
nRBC: 0 % (ref 0.0–0.2)

## 2024-02-08 LAB — LIPID PANEL
Cholesterol: 217 mg/dL — ABNORMAL HIGH (ref 0–200)
HDL: 29 mg/dL — ABNORMAL LOW (ref >40)
LDL Cholesterol: 160 mg/dL — ABNORMAL HIGH (ref 0–99)
Total CHOL/HDL Ratio: 7.5 ratio
Triglycerides: 141 mg/dL (ref <150)
VLDL: 28 mg/dL (ref 0–40)

## 2024-02-08 LAB — ECHOCARDIOGRAM COMPLETE
Area-P 1/2: 3.16 cm2
Height: 62.5 in
S' Lateral: 3.5 cm
Weight: 2169.33 [oz_av]

## 2024-02-08 LAB — HIV ANTIBODY (ROUTINE TESTING W REFLEX): HIV Screen 4th Generation wRfx: NONREACTIVE

## 2024-02-08 LAB — MRSA NEXT GEN BY PCR, NASAL: MRSA by PCR Next Gen: NOT DETECTED

## 2024-02-08 MED ORDER — LABETALOL HCL 5 MG/ML IV SOLN
10.0000 mg | INTRAVENOUS | Status: DC | PRN
  Administered 2024-02-08: 10 mg via INTRAVENOUS
  Filled 2024-02-08 (×2): qty 4

## 2024-02-08 MED ORDER — LOSARTAN POTASSIUM 25 MG PO TABS: 25.0000 mg | ORAL_TABLET | Freq: Every day | ORAL | Status: DC

## 2024-02-08 MED ORDER — HYDRALAZINE HCL 25 MG PO TABS
25.0000 mg | ORAL_TABLET | Freq: Three times a day (TID) | ORAL | Status: DC
  Administered 2024-02-08 – 2024-02-09 (×2): 25 mg via ORAL
  Filled 2024-02-08 (×2): qty 1

## 2024-02-08 MED ORDER — LOSARTAN POTASSIUM 25 MG PO TABS
25.0000 mg | ORAL_TABLET | Freq: Every day | ORAL | Status: DC
  Administered 2024-02-08: 25 mg via ORAL
  Filled 2024-02-08: qty 1

## 2024-02-08 MED ORDER — MORPHINE SULFATE (PF) 2 MG/ML IV SOLN
2.0000 mg | INTRAVENOUS | Status: DC | PRN
  Filled 2024-02-08: qty 1

## 2024-02-08 MED ORDER — LOSARTAN POTASSIUM 50 MG PO TABS
50.0000 mg | ORAL_TABLET | Freq: Every day | ORAL | Status: DC
  Administered 2024-02-08: 50 mg via ORAL
  Filled 2024-02-08: qty 1

## 2024-02-08 MED ORDER — CARVEDILOL 3.125 MG PO TABS: 3.1250 mg | ORAL_TABLET | Freq: Two times a day (BID) | ORAL | Status: DC

## 2024-02-08 MED ORDER — CARVEDILOL 3.125 MG PO TABS
3.1250 mg | ORAL_TABLET | Freq: Two times a day (BID) | ORAL | Status: DC
Start: 2024-02-08 — End: 2024-02-11
  Administered 2024-02-08 – 2024-02-11 (×6): 3.125 mg via ORAL
  Filled 2024-02-08 (×6): qty 1

## 2024-02-08 MED ORDER — ROSUVASTATIN CALCIUM 20 MG PO TABS
40.0000 mg | ORAL_TABLET | Freq: Every day | ORAL | Status: DC
  Administered 2024-02-08: 40 mg via ORAL
  Filled 2024-02-08: qty 2

## 2024-02-08 MED ORDER — ORAL CARE MOUTH RINSE: 15.0000 mL | OROMUCOSAL | Status: DC | PRN

## 2024-02-08 MED ORDER — ROSUVASTATIN CALCIUM 20 MG PO TABS
20.0000 mg | ORAL_TABLET | Freq: Every day | ORAL | Status: DC
  Administered 2024-02-09 – 2024-02-14 (×6): 20 mg via ORAL
  Filled 2024-02-08 (×6): qty 1

## 2024-02-08 MED ORDER — MORPHINE SULFATE (PF) 2 MG/ML IV SOLN
2.0000 mg | INTRAVENOUS | Status: AC | PRN
  Administered 2024-02-08 (×2): 2 mg via INTRAVENOUS
  Administered 2024-02-08: 4 mg via INTRAVENOUS
  Filled 2024-02-08: qty 1
  Filled 2024-02-08: qty 2

## 2024-02-08 MED ORDER — LOSARTAN POTASSIUM 25 MG PO TABS: 100.0000 mg | ORAL_TABLET | Freq: Every day | ORAL | Status: DC

## 2024-02-08 MED FILL — Verapamil HCl IV Soln 2.5 MG/ML: INTRAVENOUS | Qty: 2 | Status: AC

## 2024-02-08 NOTE — Progress Notes (Signed)
  Progress Note  Patient Name: Carol Henderson Date of Encounter: 02/08/2024 [facility] Cardiologist: None   Interval Summary   No chest pain.  Complaining of stomach pain.  States this has been present for some time.  Also complains of blood in stool.  She was undergoing some outpatient GI evaluation by her report.  States her breathing is okay this morning.  Vital Signs Vitals:   02/08/24 0600 02/08/24 0630 02/08/24 0700 02/08/24 0744  BP: (!) 124/90 (!) 160/114 (!) 164/108   Pulse: 71 72 88   Resp: 16 (!) 30 16   Temp:    97.6 F (36.4 C)  TempSrc:    Oral  SpO2: 92% 94% 92%   Weight:      Height:        Intake/Output Summary (Last 24 hours) at 02/08/2024 0749 Last data filed at 02/08/2024 0700 Gross per 24 hour  Intake 548.97 ml  Output --  Net 548.97 ml      02/07/2024    7:36 PM 02/06/2024    2:24 PM 01/26/2024    7:59 PM  Last 3 Weights  Weight (lbs) 135 lb 9.3 oz 135 lb 9.6 oz 135 lb  Weight (kg) 61.5 kg 61.508 kg 61.236 kg      Telemetry/ECG  Sinus rhythm- Personally Reviewed  Physical Exam  GEN: No acute distress.   Neck: No JVD Cardiac: RRR, no murmurs, rubs, or gallops.  Respiratory: Clear to auscultation bilaterally. GI: Soft, nontender, non-distended  MS: No edema, right groin site clear  Assessment & Plan  Demand ischemia: initially thought to be STEMI, taken for emergent cath/PCI, found to have moderate multivessel disease, patent stents, no culprit lesion. Trop low-level 32--->50. Pt on ASA. Outside records reviewed. Large fixed defect on recent nuclear stress test (inferolateral wall) with LVEF 41%.  Continue aspirin  81 mg daily, add rosuvastatin 40 mg daily. Acute HFrEF: in setting of respiratory infection. BNP >35K. Noted to have severe MR on outside echo. Need updated 2D echo will order.  Clinically does not appear to be decompensated.  Will place her back on losartan  100 mg daily and carvedilol . Abdominal pain, appears chronic.   Continue outpatient workup.  No anemia noted. Disposition: Transfer to telemetry bed today.  Await 2D echo result    For questions or updates, please contact [facility] Please consult [email or web address] for contact info under         Signed, Ozell Fell, MD

## 2024-02-08 NOTE — Progress Notes (Signed)
 Echocardiogram 2D Echocardiogram has been performed.  Damien FALCON Jaron Czarnecki RDCS 02/08/2024, 9:31 AM

## 2024-02-08 NOTE — Progress Notes (Signed)
 Called to see patient for continued complaints of abdominal pain.  She is uncomfortable and states that she has been hurting all day.  Acetaminophen  has not helped her pain at all.  There is some chronicity to this but she states it has been worse of late.  She is nontoxic-appearing on my evaluation.  Her abdominal exam is pertinent for no obvious masses, mild diffuse tenderness, and no rebound or guarding.  Bowel sounds are present.  She states that she was told she needs endoscopy performed but this has not been arranged yet.  Considering the severity of her abdominal pain, I am going to write her analgesics for pain using morphine  2 to 4 mg IV every hour as needed, order a CT scan of the abdomen and pelvis, and consult GI for further evaluation.  Kash Mothershead 02/08/2024 6:16 PM

## 2024-02-08 NOTE — Progress Notes (Signed)
 Received patient from [facility]. Pt reporting 9/10 abdominal pain, no PRN pain medication available besides tylenol . Notified on call for cardiology, Dr. Wonda came to bedside to assess patient.

## 2024-02-08 NOTE — Plan of Care (Signed)
  Problem: Education: Goal: Knowledge of General Education information will improve Description: Including pain rating scale, medication(s)/side effects and non-pharmacologic comfort measures Outcome: Progressing   Problem: Health Behavior/Discharge Planning: Goal: Ability to manage health-related needs will improve Outcome: Progressing   Problem: Clinical Measurements: Goal: Ability to maintain clinical measurements within normal limits will improve Outcome: Progressing Goal: Will remain free from infection Outcome: Progressing Goal: Diagnostic test results will improve Outcome: Progressing Goal: Respiratory complications will improve Outcome: Progressing Goal: Cardiovascular complication will be avoided Outcome: Progressing   Problem: Activity: Goal: Risk for activity intolerance will decrease Outcome: Progressing   Problem: Nutrition: Goal: Adequate nutrition will be maintained Outcome: Progressing   Problem: Coping: Goal: Level of anxiety will decrease Outcome: Progressing   Problem: Elimination: Goal: Will not experience complications related to bowel motility Outcome: Progressing Goal: Will not experience complications related to urinary retention Outcome: Progressing   Problem: Pain Managment: Goal: General experience of comfort will improve and/or be controlled Outcome: Progressing   Problem: Safety: Goal: Ability to remain free from injury will improve Outcome: Progressing   Problem: Skin Integrity: Goal: Risk for impaired skin integrity will decrease Outcome: Progressing   Problem: Education: Goal: Understanding of cardiac disease, CV risk reduction, and recovery process will improve Outcome: Progressing   Problem: Activity: Goal: Ability to tolerate increased activity will improve Outcome: Progressing   Problem: Cardiac: Goal: Ability to achieve and maintain adequate cardiovascular perfusion will improve Outcome: Progressing   Problem: Health  Behavior/Discharge Planning: Goal: Ability to safely manage health-related needs after discharge will improve Outcome: Progressing   Problem: Education: Goal: Understanding of CV disease, CV risk reduction, and recovery process will improve Outcome: Progressing   Problem: Activity: Goal: Ability to return to baseline activity level will improve Outcome: Progressing   Problem: Cardiovascular: Goal: Ability to achieve and maintain adequate cardiovascular perfusion will improve Outcome: Progressing Goal: Vascular access site(s) Level 0-1 will be maintained Outcome: Progressing   Problem: Health Behavior/Discharge Planning: Goal: Ability to safely manage health-related needs after discharge will improve Outcome: Progressing

## 2024-02-08 NOTE — Progress Notes (Signed)
 Relayed Dr. Margurite request for GI consult - > spoke with [facility] on call Dr. Wil[place]. They will see in AM. Dr. Wonda aware and agrees with plan.

## 2024-02-09 ENCOUNTER — Other Ambulatory Visit (HOSPITAL_COMMUNITY): Payer: Self-pay

## 2024-02-09 ENCOUNTER — Telehealth (HOSPITAL_COMMUNITY): Payer: Self-pay | Admitting: Pharmacy Technician

## 2024-02-09 DIAGNOSIS — R1084 Generalized abdominal pain: Secondary | ICD-10-CM

## 2024-02-09 DIAGNOSIS — I700 Atherosclerosis of aorta: Secondary | ICD-10-CM | POA: Diagnosis not present

## 2024-02-09 DIAGNOSIS — I2489 Other forms of acute ischemic heart disease: Secondary | ICD-10-CM | POA: Diagnosis not present

## 2024-02-09 DIAGNOSIS — I509 Heart failure, unspecified: Secondary | ICD-10-CM

## 2024-02-09 DIAGNOSIS — K625 Hemorrhage of anus and rectum: Secondary | ICD-10-CM

## 2024-02-09 DIAGNOSIS — R1013 Epigastric pain: Secondary | ICD-10-CM | POA: Diagnosis not present

## 2024-02-09 DIAGNOSIS — Z8619 Personal history of other infectious and parasitic diseases: Secondary | ICD-10-CM

## 2024-02-09 DIAGNOSIS — B974 Respiratory syncytial virus as the cause of diseases classified elsewhere: Secondary | ICD-10-CM

## 2024-02-09 LAB — BASIC METABOLIC PANEL WITH GFR
Anion gap: 11 (ref 5–15)
BUN: 45 mg/dL — ABNORMAL HIGH (ref 6–20)
CO2: 21 mmol/L — ABNORMAL LOW (ref 22–32)
Calcium: 8.7 mg/dL — ABNORMAL LOW (ref 8.9–10.3)
Chloride: 100 mmol/L (ref 98–111)
Creatinine, Ser: 1.36 mg/dL — ABNORMAL HIGH (ref 0.44–1.00)
GFR, Estimated: 45 mL/min — ABNORMAL LOW (ref >60)
Glucose, Bld: 92 mg/dL (ref 70–99)
Potassium: 3.9 mmol/L (ref 3.5–5.1)
Sodium: 132 mmol/L — ABNORMAL LOW (ref 135–145)

## 2024-02-09 LAB — LIPOPROTEIN A (LPA): Lipoprotein (a): 13.6 nmol/L (ref <75.0)

## 2024-02-09 LAB — CBC
HCT: 42 % (ref 36.0–46.0)
Hemoglobin: 14 g/dL (ref 12.0–15.0)
MCH: 29.4 pg (ref 26.0–34.0)
MCHC: 33.3 g/dL (ref 30.0–36.0)
MCV: 88.2 fL (ref 80.0–100.0)
Platelets: 363 K/uL (ref 150–400)
RBC: 4.76 MIL/uL (ref 3.87–5.11)
RDW: 13.2 % (ref 11.5–15.5)
WBC: 14.1 K/uL — ABNORMAL HIGH (ref 4.0–10.5)
nRBC: 0 % (ref 0.0–0.2)

## 2024-02-09 LAB — LACTIC ACID, PLASMA: Lactic Acid, Venous: 0.7 mmol/L (ref 0.5–1.9)

## 2024-02-09 MED ORDER — SACUBITRIL-VALSARTAN 49-51 MG PO TABS
1.0000 | ORAL_TABLET | Freq: Two times a day (BID) | ORAL | Status: DC
  Administered 2024-02-09 – 2024-02-11 (×5): 1 via ORAL
  Filled 2024-02-09 (×5): qty 1

## 2024-02-09 MED ORDER — OXYCODONE HCL 5 MG PO TABS
5.0000 mg | ORAL_TABLET | ORAL | Status: DC | PRN
  Administered 2024-02-09 – 2024-02-12 (×12): 5 mg via ORAL
  Filled 2024-02-09 (×12): qty 1

## 2024-02-09 MED ORDER — SUCRALFATE 1 GM/10ML PO SUSP
1.0000 g | Freq: Three times a day (TID) | ORAL | Status: DC
  Administered 2024-02-09 – 2024-02-13 (×17): 1 g via ORAL
  Filled 2024-02-09 (×18): qty 10

## 2024-02-09 MED ORDER — OXYCODONE-ACETAMINOPHEN 5-325 MG PO TABS
1.0000 | ORAL_TABLET | Freq: Four times a day (QID) | ORAL | Status: DC | PRN
  Administered 2024-02-09 – 2024-02-12 (×9): 2 via ORAL
  Filled 2024-02-09 (×9): qty 2

## 2024-02-09 MED ORDER — PANTOPRAZOLE SODIUM 40 MG PO TBEC
40.0000 mg | DELAYED_RELEASE_TABLET | Freq: Two times a day (BID) | ORAL | Status: DC
  Administered 2024-02-09 – 2024-02-14 (×10): 40 mg via ORAL
  Filled 2024-02-09 (×10): qty 1

## 2024-02-09 MED ORDER — MORPHINE SULFATE (PF) 2 MG/ML IV SOLN
2.0000 mg | INTRAVENOUS | Status: AC | PRN
  Administered 2024-02-09 (×3): 2 mg via INTRAVENOUS
  Filled 2024-02-09 (×3): qty 1

## 2024-02-09 NOTE — Progress Notes (Addendum)
 Progress Note  Patient Name: Carol Henderson Date of Encounter: 02/09/2024 [facility] Cardiologist: None   Interval Summary    Feels terrible this morning, continues to have intense abdominal pain.  Also with headache. Says prior to admission she was having episodes of hematochezia regularly. No BM since admission.   Vital Signs Vitals:   02/08/24 2335 02/09/24 0440 02/09/24 0848 02/09/24 0850  BP: 137/83 130/67 (!) 152/91 (!) 152/91  Pulse: 87 97 82 78  Resp: 20 19    Temp: 98.2 F (36.8 C) 98.4 F (36.9 C) 98 F (36.7 C)   TempSrc: Oral Oral Oral   SpO2: 93% 93% 96%   Weight:      Height:        Intake/Output Summary (Last 24 hours) at 02/09/2024 0905 Last data filed at 02/08/2024 1401 Gross per 24 hour  Intake 55.69 ml  Output --  Net 55.69 ml      02/07/2024    7:36 PM 02/06/2024    2:24 PM 01/26/2024    7:59 PM  Last 3 Weights  Weight (lbs) 135 lb 9.3 oz 135 lb 9.6 oz 135 lb  Weight (kg) 61.5 kg 61.508 kg 61.236 kg      Telemetry/ECG   Sinus Rhythm deep TWI - Personally Reviewed  Physical Exam  GEN: No acute distress.   Neck: No JVD Cardiac: RRR, 2/6 systolic murmur LLSB, no rubs, or gallops.  Respiratory: Diffuse wheezing, rhonchi GI: Soft, nontender, non-distended  MS: No edema  Assessment & Plan   58 y.o. female with a history of coronary artery disease status post DES to RCA + DES to Cx in 2020, COPD, asthma, CVA, and tobacco use who was seen 02/07/2024 for the evaluation of chest pain.   Chest pain Elevated troponin CAD s/p DES to RCA/Lcx '20 -- Initially thought to be a STEMI and taken for emergent cardiac catheterization, found to have moderate multivessel disease with patent stents and no culprit lesion -- hsTn 32>>50 -- continues to have intermittent chest pain, has deep TWI on telemetry this morning. Check EKG, though suspect symptoms likely 2/2 to RSV infection  -- Continue aspirin , Coreg , losartan , Crestor  Acute  HFmrEF Moderate to severe MR -- Echo 11/5 with LVEF of 40 to 45%, grade 1 diastolic dysfunction, normal RV, mid to apical inferior and inferior lateral hypokinesis, moderate to severe MR (prior echo 05/2018 with apical inferior, apex and basal/mid inferior wall hypokinesis. -- GDMT: continue coreg  3.125mg  BID, transition losartan  to Entresto 49-51  Abd pain Reported GI bleeding -- reports having intermittent episodes of BRBPR for weeks prior to admission -- Abdominal CT with no acute findings, no BM since admission -- Hgb down 17>>14, but stable  -- continue PPI -- GI consult pending  RSV infection COPD/Asthma Tobacco use -- CXR WNL on admission -- wheezing on exam, continue home inhalers, PRN albuterol   -- supportive therapy  Leukocytosis  -- WBC 14 this morning, suspect reactive? -- lactic acid WNL -- trend  For questions or updates, please contact [facility] Please consult [email or web address] for contact info under   Signed, Carol Rummer, NP   Patient seen, examined. Available data reviewed. Agree with findings, assessment, and plan as outlined by Carol Rummer, NP.  Patient is alert and oriented, no distress.  Continues to complain of abdominal pain.  HEENT normal, JVP normal, lungs with coarse rhonchi bilaterally, heart regular rate and rhythm, abdomen soft with diffuse tenderness no rebound or guarding, extremities without edema.  To summarize, the patient presented as a code STEMI but was not found to have severe obstructive CAD.  She had mild troponin elevation in her overall presentation is now demonstrated to be inconsistent with ACS.  She has RSV and severe abdominal pain.  She also has multiple body aches and other complaints.  She may have demand ischemia in the setting of her RSV infection.  A CT scan was ordered yesterday to evaluate her abdominal pain and there were no acute findings noted.  We have requested GI consultation for further evaluation.  The  patient states that she was supposed to have endoscopy performed as an outpatient.  We will also ask the hospitalist to see her to help with management of her other noncardiac issues.  For her heart failure and hypertension, she will be changed from losartan  to [facility] as outlined above.  Otherwise she will continue on her current medications.  Carol Henderson, M.D. 02/09/2024 12:12 PM

## 2024-02-09 NOTE — Plan of Care (Signed)
 Problem: Education: Goal: Knowledge of General Education information will improve Description: Including pain rating scale, medication(s)/side effects and non-pharmacologic comfort measures 02/09/2024 0419 by Mylo Laurey RAMAN, RN Outcome: Progressing 02/09/2024 0413 by Mylo Laurey RAMAN, RN Outcome: Progressing   Problem: Health Behavior/Discharge Planning: Goal: Ability to manage health-related needs will improve 02/09/2024 0419 by Mylo Laurey RAMAN, RN Outcome: Progressing 02/09/2024 0413 by Mylo Laurey RAMAN, RN Outcome: Progressing   Problem: Clinical Measurements: Goal: Ability to maintain clinical measurements within normal limits will improve 02/09/2024 0419 by Mylo Laurey RAMAN, RN Outcome: Progressing 02/09/2024 0413 by Mylo Laurey RAMAN, RN Outcome: Progressing Goal: Will remain free from infection 02/09/2024 0419 by Mylo Laurey RAMAN, RN Outcome: Progressing 02/09/2024 0413 by Mylo Laurey RAMAN, RN Outcome: Progressing Goal: Diagnostic test results will improve 02/09/2024 0419 by Mylo Laurey RAMAN, RN Outcome: Progressing 02/09/2024 0413 by Mylo Laurey RAMAN, RN Outcome: Progressing Goal: Respiratory complications will improve 02/09/2024 0419 by Mylo Laurey RAMAN, RN Outcome: Progressing 02/09/2024 0413 by Mylo Laurey RAMAN, RN Outcome: Progressing Goal: Cardiovascular complication will be avoided 02/09/2024 0419 by Mylo Laurey RAMAN, RN Outcome: Progressing 02/09/2024 0413 by Mylo Laurey RAMAN, RN Outcome: Progressing   Problem: Activity: Goal: Risk for activity intolerance will decrease 02/09/2024 0419 by Mylo Laurey RAMAN, RN Outcome: Progressing 02/09/2024 0413 by Mylo Laurey RAMAN, RN Outcome: Progressing   Problem: Nutrition: Goal: Adequate nutrition will be maintained 02/09/2024 0419 by Mylo Laurey RAMAN, RN Outcome: Progressing 02/09/2024 0413 by Mylo Laurey RAMAN, RN Outcome: Progressing   Problem: Coping: Goal: Level of anxiety will  decrease 02/09/2024 0419 by Mylo Laurey RAMAN, RN Outcome: Progressing 02/09/2024 0413 by Mylo Laurey RAMAN, RN Outcome: Progressing   Problem: Elimination: Goal: Will not experience complications related to bowel motility 02/09/2024 0419 by Mylo Laurey RAMAN, RN Outcome: Progressing 02/09/2024 0413 by Mylo Laurey RAMAN, RN Outcome: Progressing Goal: Will not experience complications related to urinary retention 02/09/2024 0419 by Mylo Laurey RAMAN, RN Outcome: Progressing 02/09/2024 0413 by Mylo Laurey RAMAN, RN Outcome: Progressing   Problem: Pain Managment: Goal: General experience of comfort will improve and/or be controlled 02/09/2024 0419 by Mylo Laurey RAMAN, RN Outcome: Progressing 02/09/2024 0413 by Mylo Laurey RAMAN, RN Outcome: Progressing   Problem: Safety: Goal: Ability to remain free from injury will improve 02/09/2024 0419 by Mylo Laurey RAMAN, RN Outcome: Progressing 02/09/2024 0413 by Mylo Laurey RAMAN, RN Outcome: Progressing   Problem: Skin Integrity: Goal: Risk for impaired skin integrity will decrease 02/09/2024 0419 by Mylo Laurey RAMAN, RN Outcome: Progressing 02/09/2024 0413 by Mylo Laurey RAMAN, RN Outcome: Progressing   Problem: Education: Goal: Understanding of cardiac disease, CV risk reduction, and recovery process will improve 02/09/2024 0419 by Mylo Laurey RAMAN, RN Outcome: Progressing 02/09/2024 0413 by Mylo Laurey RAMAN, RN Outcome: Progressing Goal: Individualized Educational Video(s) 02/09/2024 0419 by Mylo Laurey RAMAN, RN Outcome: Progressing 02/09/2024 0413 by Mylo Laurey RAMAN, RN Outcome: Progressing   Problem: Activity: Goal: Ability to tolerate increased activity will improve 02/09/2024 0419 by Mylo Laurey RAMAN, RN Outcome: Progressing 02/09/2024 0413 by Mylo Laurey RAMAN, RN Outcome: Progressing   Problem: Cardiac: Goal: Ability to achieve and maintain adequate cardiovascular perfusion will improve 02/09/2024 0419  by Mylo Laurey RAMAN, RN Outcome: Progressing 02/09/2024 0413 by Mylo Laurey RAMAN, RN Outcome: Progressing   Problem: Health Behavior/Discharge Planning: Goal: Ability to safely manage health-related needs after discharge will improve 02/09/2024 0419 by Mylo Laurey RAMAN, RN Outcome: Progressing 02/09/2024 0413 by Mylo Laurey RAMAN, RN Outcome: Progressing   Problem: Education: Goal:  Understanding of CV disease, CV risk reduction, and recovery process will improve 02/09/2024 0419 by Mylo Laurey RAMAN, RN Outcome: Progressing 02/09/2024 0413 by Mylo Laurey RAMAN, RN Outcome: Progressing Goal: Individualized Educational Video(s) 02/09/2024 0419 by Mylo Laurey RAMAN, RN Outcome: Progressing 02/09/2024 0413 by Mylo Laurey RAMAN, RN Outcome: Progressing   Problem: Activity: Goal: Ability to return to baseline activity level will improve 02/09/2024 0419 by Mylo Laurey RAMAN, RN Outcome: Progressing 02/09/2024 0413 by Mylo Laurey RAMAN, RN Outcome: Progressing   Problem: Cardiovascular: Goal: Ability to achieve and maintain adequate cardiovascular perfusion will improve 02/09/2024 0419 by Mylo Laurey RAMAN, RN Outcome: Progressing 02/09/2024 0413 by Mylo Laurey RAMAN, RN Outcome: Progressing Goal: Vascular access site(s) Level 0-1 will be maintained 02/09/2024 0419 by Mylo Laurey RAMAN, RN Outcome: Progressing 02/09/2024 0413 by Mylo Laurey RAMAN, RN Outcome: Progressing   Problem: Health Behavior/Discharge Planning: Goal: Ability to safely manage health-related needs after discharge will improve 02/09/2024 0419 by Mylo Laurey RAMAN, RN Outcome: Progressing 02/09/2024 0413 by Mylo Laurey RAMAN, RN Outcome: Progressing

## 2024-02-09 NOTE — Progress Notes (Signed)
 Heart Failure Navigator Progress Note  Assessed for Heart & Vascular TOC clinic readiness.  Patient does not meet criteria due to she is seen by [facility] clinic. No HF TOC. .   Navigator will sign off at this time.   Stephane Haddock, BSN, Scientist, Clinical (histocompatibility And Immunogenetics) Only

## 2024-02-09 NOTE — Telephone Encounter (Signed)
 Patient Product/process Development Scientist completed.    The patient is insured through [facility]. Patient has Medicare and is not eligible for a copay card, but may be able to apply for patient assistance or Medicare RX Payment Plan (Patient Must reach out to their plan, if eligible for payment plan), if available.    Ran test claim for Entresto 24-26 mg and the current 30 day co-pay is $0.00  Ran test claim for Jardiance 10 mg and the current 30 day co-pay is $0.00.  Ran test claim for Farxiga 10 mg and the current 30 day co-pay is $0.00.    This test claim was processed through [facility]- copay amounts may vary at other pharmacies due to pharmacy/plan contracts, or as the patient moves through the different stages of their insurance plan.     Reyes Sharps, CPHT Pharmacy Technician Patient Advocate Specialist Lead [facility] Patient Advocate Team Direct Number: [phone]  Fax: [phone]
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
## 2024-02-09 NOTE — TOC Initial Note (Signed)
 Transition of Care [facility]) - Initial/Assessment Note    Patient Details  Name: Carol Henderson MRN: [id number] Date of Birth: November 04, 1965  Transition of Care [facility]) CM/SW Contact:    Sudie Erminio Deems, RN Phone Number: 02/09/2024, 11:40 AM  Clinical Narrative: Risk for readmission assessment completed. Patient presented for [facility]. Benefits check has been completed for Entresto, Jardiance, and Farxiga. PTA patient was from home with sister and plans to return home.  Patient states she has PCP and insurance. Patient's sister takes her to all appointments. No home needs identified during this visit. ICM will continue to follow for additional disposition needs.               Expected Discharge Plan: Home/Self Care Barriers to Discharge: No Barriers Identified   Patient Goals and CMS Choice Patient states their goals for this hospitalization and ongoing recovery are:: Plan to return home once stable   Choice offered to / list presented to : NA      Expected Discharge Plan and Services In-house Referral: NA Discharge Planning Services: CM Consult Post Acute Care Choice: NA Living arrangements for the past 2 months: Single Family Home   Prior Living Arrangements/Services Living arrangements for the past 2 months: Single Family Home Lives with:: Siblings Patient language and need for interpreter reviewed:: Yes Do you feel safe going back to the place where you live?: Yes      Need for Family Participation in Patient Care: No (Comment) Care giver support system in place?: No (comment)   Criminal Activity/Legal Involvement Pertinent to Current Situation/Hospitalization: No - Comment as needed  Activities of Daily Living   ADL Screening (condition at time of admission) Is the patient deaf or have difficulty hearing?: No Does the patient have difficulty seeing, even when wearing glasses/contacts?: No Does the patient have difficulty concentrating, remembering, or making  decisions?: No  Permission Sought/Granted Permission sought to share information with : Family Supports, Case Manager   Emotional Assessment Appearance:: Appears stated age Attitude/Demeanor/Rapport: Engaged Affect (typically observed): Appropriate Orientation: : Oriented to  Time, Oriented to Place, Oriented to Self, Oriented to Situation Alcohol / Substance Use: Not Applicable Psych Involvement: No (comment)  Admission diagnosis:  ST elevation myocardial infarction (STEMI), unspecified artery (HCC) [I21.3] STEMI (ST elevation myocardial infarction) (HCC) [I21.3] Patient Active Problem List   Diagnosis Date Noted   Demand ischemia of myocardium (HCC) 02/08/2024   Tension headache 01/10/2024   GAD (generalized anxiety disorder) 10/21/2023   GI bleeding 09/23/2018   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   STEMI (ST elevation myocardial infarction) (HCC) 05/14/2018   Acute ST elevation myocardial infarction (STEMI) of inferolateral wall (HCC)    Atypical chest pain 04/24/2018   Right shoulder pain 10/15/2016   Chronic radicular low back pain 06/02/2016   Dyslipidemia 06/02/2016   S/P drug eluting coronary stent placement 05/31/2016   Right wrist pain 05/05/2016   Benign neoplasm of ascending colon    Special screening for malignant neoplasms, colon    First degree hemorrhoids    Angina at rest 07/16/2015   Sacral insufficiency fracture 07/08/2015   COPD (chronic obstructive pulmonary disease) (HCC) 07/08/2015   Left-sided chest pain 07/08/2015   Blood per rectum 06/18/2015   Lower abdominal pain 06/18/2015   Affective bipolar disorder (HCC) 02/12/2015   Breast pain 02/12/2015   CAFL (chronic airflow limitation) (HCC) 02/12/2015   Epigastric pain 02/05/2015   Gallstones 02/05/2015   Gallstones without obstruction of gallbladder 01/30/2015   Epigastric abdominal pain 01/21/2015  Abnormal finding on EKG 01/21/2015   Epigastric abdominal pain 01/21/2015   Abdominal pain, acute,  epigastric 12/31/2014   Abnormal toxicological findings 09/20/2014   Positive urine drug screen 09/20/2014   Continuous opioid dependence (HCC) 06/20/2014   Hypomagnesemia 08/18/2013   Arterial blood pressure decreased 08/18/2013   Hypotension 08/18/2013   Syncope 08/18/2013   Decreased potassium in the blood 07/09/2013   Pain of metastatic malignancy 07/03/2013   Chemotherapy induced nausea and vomiting 06/19/2013   History of cancer of vulva 05/27/2013   Vulva cancer (HCC) 05/27/2013   HEPATITIS C 11/30/2006   ABUSE, OTHER/MIXED/UNSPECIFIED DRUG, EPISODIC 11/30/2006   Essential hypertension 11/30/2006   CORONARY ARTERY DISEASE 11/30/2006   Peptic ulcer 11/30/2006   BORDERLINE PERSONALITY 06/02/2006   TOBACCO DEPENDENCE 06/02/2006   HYPERTENSION, BENIGN SYSTEMIC 06/02/2006   Asthma, mild intermittent, well-controlled 06/02/2006   GASTROESOPHAGEAL REFLUX, NO ESOPHAGITIS 06/02/2006   PCP:  [name], MD Pharmacy:   [facility] [address] [place] - [address] [place] [address] Phone: [phone] Fax: [phone]  [facility] [address] [place] - [address] [place] [address] [place] [address] Phone: [phone] Fax: [phone]  [facility] [address] [place] [address] Phone: [phone] Fax: [phone]     Social Drivers of Health (SDOH) Social History: SDOH Screenings   Food Insecurity: No Food Insecurity (02/08/2024)  Housing: High Risk (02/08/2024)  Transportation Needs: No Transportation Needs (02/08/2024)  Utilities: Not At Risk (02/08/2024)  Depression (PHQ2-9): High Risk (10/21/2023)  Financial Resource Strain: Low Risk  (11/08/2023)   Received from [facility]  Tobacco Use: High Risk (02/07/2024)   SDOH Interventions:     Readmission Risk Interventions    02/09/2024   11:38 AM  Readmission Risk Prevention Plan  Transportation  Screening Complete  HRI or Home Care Consult Complete  Social Work Consult for Recovery Care Planning/Counseling Complete  Palliative Care Screening Not Applicable  Medication Review Oceanographer) Referral to Pharmacy
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
## 2024-02-10 DIAGNOSIS — I2489 Other forms of acute ischemic heart disease: Secondary | ICD-10-CM | POA: Diagnosis not present

## 2024-02-10 DIAGNOSIS — R7989 Other specified abnormal findings of blood chemistry: Secondary | ICD-10-CM | POA: Diagnosis not present

## 2024-02-10 DIAGNOSIS — K921 Melena: Secondary | ICD-10-CM

## 2024-02-10 DIAGNOSIS — B338 Other specified viral diseases: Secondary | ICD-10-CM

## 2024-02-10 LAB — BASIC METABOLIC PANEL WITH GFR
Anion gap: 9 (ref 5–15)
BUN: 35 mg/dL — ABNORMAL HIGH (ref 6–20)
CO2: 20 mmol/L — ABNORMAL LOW (ref 22–32)
Calcium: 8.5 mg/dL — ABNORMAL LOW (ref 8.9–10.3)
Chloride: 102 mmol/L (ref 98–111)
Creatinine, Ser: 1.35 mg/dL — ABNORMAL HIGH (ref 0.44–1.00)
GFR, Estimated: 46 mL/min — ABNORMAL LOW (ref >60)
Glucose, Bld: 90 mg/dL (ref 70–99)
Potassium: 4 mmol/L (ref 3.5–5.1)
Sodium: 131 mmol/L — ABNORMAL LOW (ref 135–145)

## 2024-02-10 LAB — CBC
HCT: 42.8 % (ref 36.0–46.0)
HCT: 42.4 % (ref 36.0–46.0)
Hemoglobin: 14.4 g/dL (ref 12.0–15.0)
Hemoglobin: 14.6 g/dL (ref 12.0–15.0)
MCH: 29.5 pg (ref 26.0–34.0)
MCH: 29.7 pg (ref 26.0–34.0)
MCHC: 33.6 g/dL (ref 30.0–36.0)
MCHC: 34.4 g/dL (ref 30.0–36.0)
MCV: 87.7 fL (ref 80.0–100.0)
MCV: 86.4 fL (ref 80.0–100.0)
Platelets: 369 K/uL (ref 150–400)
Platelets: 373 K/uL (ref 150–400)
RBC: 4.88 MIL/uL (ref 3.87–5.11)
RBC: 4.91 MIL/uL (ref 3.87–5.11)
RDW: 13.1 % (ref 11.5–15.5)
RDW: 13 % (ref 11.5–15.5)
WBC: 12.4 K/uL — ABNORMAL HIGH (ref 4.0–10.5)
WBC: 13.8 K/uL — ABNORMAL HIGH (ref 4.0–10.5)
nRBC: 0 % (ref 0.0–0.2)
nRBC: 0 % (ref 0.0–0.2)

## 2024-02-10 MED ORDER — CLONAZEPAM 0.5 MG PO TBDP
0.5000 mg | ORAL_TABLET | Freq: Two times a day (BID) | ORAL | Status: DC | PRN
Start: 2024-02-10 — End: 2024-02-14
  Administered 2024-02-10 – 2024-02-12 (×4): 0.5 mg via ORAL
  Filled 2024-02-10 (×4): qty 1

## 2024-02-10 NOTE — Plan of Care (Signed)

## 2024-02-10 NOTE — Progress Notes (Addendum)
 [facility] PROGRESS NOTE    Progress Note  Carol Henderson  FMW:[id number] DOB: 10/17/1965 DOA: 02/07/2024 PCP: [name], MD     Brief Narrative:   Carol Henderson is an 58 y.o. female past medical history significant for CAD status post DES to the RCA and circumflex in 2020, COPD, vulvar cancer, essential hypertension and schizophrenia with bipolar disorder CVA tobacco abuse who came into the hospital for chest pain mildly elevated troponins likely demand ischemia in the setting of RSV infection 2D echo on admission showed normal LV function.  Taken emergently for left heart cath no culprit lesion found started having abdominal pain and melena patient denied any hematemesis.  Assessment/Plan:   Chest pain/ Elevated troponin: Initially admitted as a code STEMI, cardiology was consulted taken for emergent cath no culprit lesion found. Cardiac biomarkers remain flat. Cardiology recommend to continue aspirin , Coreg , losartan  and Crestor.  Acute decompensated HFrEF: The echo showed an EF of 40% grade 1 diastolic dysfunction with mild apical inferior hypokinesia with moderate to severe mitral regurgitation. Cardiology recommended to continue GDMT continue Coreg  and Entresto.  Abdominal pain/melanotic stools: Patient was temporarily on heparin  reports bright red blood per rectum per weeks.  Discontinue Lovenox  for DVT prophylaxis CT scan of the abdomen showed no acute findings. Continue empirically on IV Protonix  and sucralfate . GI was consulted who recommended conservative management with PPI Carafate  for now. Hopefully EGD prior to discharge if she remains stable. She relates this morning she noted having bright red blood per rectum, relates her abdominal pain is slightly improved. Hemoglobin is relatively stable this morning, placed her on SCDs. Check a CBC regularly.  CBC this morning is pending.  COPD/asthma/RSV infection: The setting of tobacco abuse. Chest x-ray  showed no acute findings. She was wheezing on physical exam, actually saturating well on room air. She was started on inhalers. Will continue conservative management and supportive care.  Leukocytosis: Suspect reactive   DVT prophylaxis: scd Family Communication:none Status is: Inpatient Remains inpatient appropriate because: Melanotic stools    Code Status:     Code Status Orders  (From admission, onward)           Start     Ordered   02/07/24 2114  Full code  Continuous       Question:  By:  Answer:  Consent: discussion documented in EHR   02/07/24 2116           Code Status History     Date Active Date Inactive Code Status Order ID Comments User Context   09/23/2018 0540 09/23/2018 1028 Full Code [id number]  Lawence Madison LABOR, MD ED   05/14/2018 0046 05/16/2018 1327 Full Code [id number]  Darron Deatrice LABOR, MD Inpatient   04/24/2018 2130 04/27/2018 1725 Full Code [id number]  Laurence Bridegroom, MD Inpatient   04/21/2017 1525 04/21/2017 2007 Full Code [id number]  Florencio Cara BIRCH, MD Inpatient   05/31/2016 1733 06/01/2016 1813 Full Code [id number]  Florencio Cara BIRCH, MD Inpatient   07/16/2015 1654 07/17/2015 1710 Full Code [id number]  Florencio Cara BIRCH, MD Inpatient         IV Access:   Peripheral IV   Procedures and diagnostic studies:   CT ABDOMEN PELVIS WO CONTRAST Result Date: 02/08/2024 EXAM: CT ABDOMEN AND PELVIS WITH CONTRAST 02/08/2024 06:40:00 PM TECHNIQUE: CT of the abdomen and pelvis was performed with the administration of intravenous contrast. Multiplanar reformatted images are provided for review. Automated exposure control, iterative reconstruction, and/or weight-based adjustment of the  mA/kV was utilized to reduce the radiation dose to as low as reasonably achievable. COMPARISON: 01/27/2024, 09/25/2018 CLINICAL HISTORY: Abdominal pain, acute, nonlocalized. FINDINGS: LOWER CHEST: Interval band-like densities in the lower lobes presumably atelectasis. LIVER: The liver is  unremarkable. GALLBLADDER AND BILE DUCTS: Small amount of hyperdense material in the gallbladder likely vicarious excretion. No biliary ductal dilatation. SPLEEN: No acute abnormality. PANCREAS: No acute abnormality. ADRENAL GLANDS: No acute abnormality. KIDNEYS, URETERS AND BLADDER: Small nonobstructing right kidney stone. Mild excreted contrast in the renal collecting systems and bladder. No hydronephrosis. No perinephric or periureteral stranding. Urinary bladder is unremarkable. GI AND BOWEL: Stomach demonstrates no acute abnormality. Scattered fluid-filled loops of small bowel in the lower abdomen and pelvis without obstruction or wall thickening. PERITONEUM AND RETROPERITONEUM: No ascites. No free air. VASCULATURE: Aorta is normal in caliber. Moderate aortic atherosclerosis. LYMPH NODES: No lymphadenopathy. REPRODUCTIVE ORGANS: No acute abnormality. BONES AND SOFT TISSUES: No acute osseous abnormality. No focal soft tissue abnormality. IMPRESSION: 1. No acute findings in the abdomen or pelvis. 2. Few fluid-filled loops of small bowel in the abdomen and pelvis, nonspecific in appearance and without evidence for obstruction. 3. Nonobstructing right kidney stone 4. Moderate aortic atherosclerosis. Electronically signed by: Luke Bun MD 02/08/2024 07:05 PM EST RP Workstation: [id number]   ECHOCARDIOGRAM COMPLETE Result Date: 02/08/2024    ECHOCARDIOGRAM REPORT   Patient Name:   Carol Henderson Date of Exam: 02/08/2024 Medical Rec #:  [id number]      Height:       62.5 in Accession #:    [id number]     Weight:       135.6 lb Date of Birth:  [birth year]     BSA:          1.630 m Patient Age:    57 years       BP:           142/112 mmHg Patient Gender: F              HR:           71 bpm. Exam Location:  Inpatient Procedure: 2D Echo, Cardiac Doppler and Color Doppler (Both Spectral and Color            Flow Doppler were utilized during procedure). Indications:    Acute MI i24.9  History:        Patient has prior  history of Echocardiogram examinations, most                 recent 05/14/2018. CAD, COPD; Risk Factors:Hypertension.  Sonographer:    Damien Senior RDCS Referring Phys: [id number] DAMARCUS A INGRAM IMPRESSIONS  1. Mid to apical inferior and inferolateral hypokinesis. Left ventricular ejection fraction, by estimation, is 40 to 45%. The left ventricle has mildly decreased function. The left ventricle demonstrates regional wall motion abnormalities (see scoring diagram/findings for description). There is mild concentric left ventricular hypertrophy. Left ventricular diastolic parameters are consistent with Grade I diastolic dysfunction (impaired relaxation).  2. Right ventricular systolic function is normal. The right ventricular size is normal. Tricuspid regurgitation signal is inadequate for assessing PA pressure.  3. There is moderate to severe mitral regurgitation due to posterior leaflet restriction related to wall motion abnormality.. The mitral valve is abnormal. Moderate mitral valve regurgitation. No evidence of mitral stenosis.  4. The aortic valve has an indeterminant number of cusps. Aortic valve regurgitation is not visualized. No aortic stenosis is present.  5. The inferior vena cava is normal in size  with greater than 50% respiratory variability, suggesting right atrial pressure of 3 mmHg. FINDINGS  Left Ventricle: Mid to apical inferior and inferolateral hypokinesis. Left ventricular ejection fraction, by estimation, is 40 to 45%. The left ventricle has mildly decreased function. The left ventricle demonstrates regional wall motion abnormalities. The left ventricular internal cavity size was normal in size. There is mild concentric left ventricular hypertrophy. Left ventricular diastolic function could not be evaluated due to mitral regurgitation (moderate or greater). Left ventricular diastolic parameters are consistent with Grade I diastolic dysfunction (impaired relaxation).  LV Wall Scoring: The mid and  distal inferior wall and posterior wall are hypokinetic. The entire anterior wall, antero-lateral wall, entire septum, apical lateral segment, basal inferior segment, and apex are normal. Right Ventricle: The right ventricular size is normal. No increase in right ventricular wall thickness. Right ventricular systolic function is normal. Tricuspid regurgitation signal is inadequate for assessing PA pressure. Left Atrium: Left atrial size was normal in size. Right Atrium: Right atrial size was normal in size. Pericardium: There is no evidence of pericardial effusion. Mitral Valve: There is moderate to severe mitral regurgitation due to posterior leaflet restriction related to wall motion abnormality. The mitral valve is abnormal. Moderate mitral valve regurgitation, with eccentric anteriorly directed jet. No evidence  of mitral valve stenosis. Tricuspid Valve: The tricuspid valve is normal in structure. Tricuspid valve regurgitation is trivial. No evidence of tricuspid stenosis. Aortic Valve: The aortic valve has an indeterminant number of cusps. Aortic valve regurgitation is not visualized. No aortic stenosis is present. Pulmonic Valve: The pulmonic valve was grossly normal. Pulmonic valve regurgitation is not visualized. No evidence of pulmonic stenosis. Aorta: The aortic root and ascending aorta are structurally normal, with no evidence of dilitation. Venous: A pattern of systolic flow reversal, suggestive of severe mitral regurgitation is recorded from the right lower pulmonary vein. The inferior vena cava is normal in size with greater than 50% respiratory variability, suggesting right atrial pressure of 3 mmHg. IAS/Shunts: No atrial level shunt detected by color flow Doppler.  LEFT VENTRICLE PLAX 2D LVIDd:         4.90 cm   Diastology LVIDs:         3.50 cm   LV e' lateral:   6.20 cm/s LV PW:         1.30 cm   LV E/e' lateral: 8.9 LV IVS:        1.30 cm LVOT diam:     2.00 cm LV SV:         58 LV SV Index:   36  LVOT Area:     3.14 cm LV IVRT:       158 msec  RIGHT VENTRICLE RV S prime:     10.90 cm/s TAPSE (M-mode): 1.6 cm LEFT ATRIUM             Index        RIGHT ATRIUM           Index LA diam:        3.60 cm 2.21 cm/m   RA Area:     13.40 cm LA Vol (A2C):   45.5 ml 27.91 ml/m  RA Volume:   30.70 ml  18.83 ml/m LA Vol (A4C):   46.6 ml 28.58 ml/m LA Biplane Vol: 46.6 ml 28.58 ml/m  AORTIC VALVE LVOT Vmax:   110.00 cm/s LVOT Vmean:  78.900 cm/s LVOT VTI:    0.185 m  AORTA Ao Root diam: 2.70 cm Ao  Asc diam:  3.40 cm MITRAL VALVE MV Area (PHT): 3.16 cm     SHUNTS MV Decel Time: 240 msec     Systemic VTI:  0.18 m MV E velocity: 55.00 cm/s   Systemic Diam: 2.00 cm MV A velocity: 115.00 cm/s MV E/A ratio:  0.48 Annabella Scarce MD Electronically signed by Annabella Scarce MD Signature Date/Time: 02/08/2024/1:06:23 PM    Final      Medical Consultants:   None.   Subjective:    Carol Henderson that she continues to have abdominal pain better than yesterday had bright red blood per rectum this morning.  Objective:    Vitals:   02/09/24 1730 02/09/24 2005 02/10/24 0005 02/10/24 0503  BP: (!) 140/99 138/87 121/84 119/89  Pulse: 85 77 63 64  Resp:  16 18 19   Temp:  98.5 F (36.9 C) 98.4 F (36.9 C) 98.5 F (36.9 C)  TempSrc:  Oral Oral Oral  SpO2:  92% 91% 91%  Weight:      Height:       SpO2: 91 % O2 Flow Rate (L/min): 2 L/min   Intake/Output Summary (Last 24 hours) at 02/10/2024 0757 Last data filed at 02/09/2024 1620 Gross per 24 hour  Intake 123 ml  Output --  Net 123 ml   Filed Weights   02/07/24 1936  Weight: 61.5 kg    Exam: General exam: In no acute distress. Respiratory system: Good air movement and clear to auscultation. Cardiovascular system: S1 & S2 heard, RRR. No JVD. Gastrointestinal system: Positive bowel sounds soft diffuse mildly tender abdomen no rebound or guarding. Extremities: No pedal edema. Skin: No rashes, lesions or ulcers Psychiatry: Judgement and  insight appear normal. Mood & affect appropriate.    Data Reviewed:    Labs: Basic Metabolic Panel: Recent Labs  Lab 02/07/24 1948 02/07/24 2309 02/08/24 0316 02/09/24 0439 02/10/24 0405  NA 139  --  136 132* 131*  K 4.3  --  4.1 3.9 4.0  CL 99  --  101 100 102  CO2 23  --  21* 21* 20*  GLUCOSE 135*  --  148* 92 90  BUN 28*  --  35* 45* 35*  CREATININE 1.40* 1.48* 1.55* 1.36* 1.35*  CALCIUM  10.2  --  9.1 8.7* 8.5*  MG 2.2  --   --   --   --    GFR Estimated Creatinine Clearance: 37.2 mL/min (A) (by C-G formula based on SCr of 1.35 mg/dL (H)). Liver Function Tests: Recent Labs  Lab 02/07/24 1948  AST 22  ALT 12  ALKPHOS 121  BILITOT 0.3  PROT 8.8*  ALBUMIN 4.2   No results for input(s): LIPASE, AMYLASE in the last 168 hours. No results for input(s): AMMONIA in the last 168 hours. Coagulation profile Recent Labs  Lab 02/07/24 1948  INR 0.9   COVID-19 Labs  No results for input(s): DDIMER, FERRITIN, LDH, CRP in the last 72 hours.  Lab Results  Component Value Date   SARSCOV2NAA NEGATIVE 02/07/2024   SARSCOV2NAA NEGATIVE 12/20/2023    CBC: Recent Labs  Lab 02/07/24 1948 02/07/24 2309 02/08/24 0316 02/09/24 0439  WBC 11.7* 9.7 6.2 14.1*  NEUTROABS 5.9  --   --   --   HGB 17.2* 15.8* 15.3* 14.0  HCT 49.7* 47.5* 46.2* 42.0  MCV 87.2 89.0 90.1 88.2  PLT 460* 408* 403* 363   Cardiac Enzymes: No results for input(s): CKTOTAL, CKMB, CKMBINDEX, TROPONINI in the last 168 hours. BNP (last  3 results) Recent Labs    02/07/24 1948  PROBNP >35,000.0*   CBG: No results for input(s): GLUCAP in the last 168 hours. D-Dimer: No results for input(s): DDIMER in the last 72 hours. Hgb A1c: Recent Labs    02/07/24 1948  HGBA1C 5.1   Lipid Profile: Recent Labs    02/07/24 1948 02/08/24 0316  CHOL 239* 217*  HDL 35* 29*  LDLCALC 173* 839*  TRIG 154* 141  CHOLHDL 6.8 7.5   Thyroid  function studies: No results for  input(s): TSH, T4TOTAL, T3FREE, THYROIDAB in the last 72 hours.  Invalid input(s): FREET3 Anemia work up: No results for input(s): VITAMINB12, FOLATE, FERRITIN, TIBC, IRON, RETICCTPCT in the last 72 hours. Sepsis Labs: Recent Labs  Lab 02/07/24 1948 02/07/24 2309 02/08/24 0316 02/09/24 0439  WBC 11.7* 9.7 6.2 14.1*  LATICACIDVEN 1.7  --   --  0.7   Microbiology Recent Results (from the past 240 hours)  Resp panel by RT-PCR (RSV, Flu A&B, Covid) Anterior Nasal Swab     Status: Abnormal   Collection Time: 02/07/24  8:07 PM   Specimen: Anterior Nasal Swab  Result Value Ref Range Status   SARS Coronavirus 2 by RT PCR NEGATIVE NEGATIVE Final    Comment: (NOTE) SARS-CoV-2 target nucleic acids are NOT DETECTED.  The SARS-CoV-2 RNA is generally detectable in upper respiratory specimens during the acute phase of infection. The lowest concentration of SARS-CoV-2 viral copies this assay can detect is 138 copies/mL. A negative result does not preclude SARS-Cov-2 infection and should not be used as the sole basis for treatment or other patient management decisions. A negative result may occur with  improper specimen collection/handling, submission of specimen other than nasopharyngeal swab, presence of viral mutation(s) within the areas targeted by this assay, and inadequate number of viral copies(<138 copies/mL). A negative result must be combined with clinical observations, patient history, and epidemiological information. The expected result is Negative.  Fact Sheet for Patients:  bloggercourse.com  Fact Sheet for Healthcare Providers:  seriousbroker.it  This test is no t yet approved or cleared by the [place]  FDA and  has been authorized for detection and/or diagnosis of SARS-CoV-2 by FDA under an Emergency Use Authorization (EUA). This EUA will remain  in effect (meaning this test can be used) for the  duration of the COVID-19 declaration under Section 564(b)(1) of the Act, 21 U.S.C.section 360bbb-3(b)(1), unless the authorization is terminated  or revoked sooner.       Influenza A by PCR NEGATIVE NEGATIVE Final   Influenza B by PCR NEGATIVE NEGATIVE Final    Comment: (NOTE) The Xpert Xpress SARS-CoV-2/FLU/RSV plus assay is intended as an aid in the diagnosis of influenza from Nasopharyngeal swab specimens and should not be used as a sole basis for treatment. Nasal washings and aspirates are unacceptable for Xpert Xpress SARS-CoV-2/FLU/RSV testing.  Fact Sheet for Patients: bloggercourse.com  Fact Sheet for Healthcare Providers: seriousbroker.it  This test is not yet approved or cleared by the [place]  FDA and has been authorized for detection and/or diagnosis of SARS-CoV-2 by FDA under an Emergency Use Authorization (EUA). This EUA will remain in effect (meaning this test can be used) for the duration of the COVID-19 declaration under Section 564(b)(1) of the Act, 21 U.S.C. section 360bbb-3(b)(1), unless the authorization is terminated or revoked.     Resp Syncytial Virus by PCR POSITIVE (A) NEGATIVE Final    Comment: (NOTE) Fact Sheet for Patients: bloggercourse.com  Fact Sheet for Healthcare Providers: seriousbroker.it  This test is not yet approved or cleared by the [place]  FDA and has been authorized for detection and/or diagnosis of SARS-CoV-2 by FDA under an Emergency Use Authorization (EUA). This EUA will remain in effect (meaning this test can be used) for the duration of the COVID-19 declaration under Section 564(b)(1) of the Act, 21 U.S.C. section 360bbb-3(b)(1), unless the authorization is terminated or revoked.  Performed at [facility], [address] [place], [place] [address]   MRSA Next Gen by PCR, Nasal     Status: None   Collection Time:  02/07/24 10:09 PM   Specimen: Nasal Mucosa; Nasal Swab  Result Value Ref Range Status   MRSA by PCR Next Gen NOT DETECTED NOT DETECTED Final    Comment: (NOTE) The GeneXpert MRSA Assay (FDA approved for NASAL specimens only), is one component of a comprehensive MRSA colonization surveillance program. It is not intended to diagnose MRSA infection nor to guide or monitor treatment for MRSA infections. Test performance is not FDA approved in patients less than 23 years old. Performed at [facility], [address] [place], [place] [address]      Medications:    aspirin  EC  81 mg Oral Daily   carvedilol   3.125 mg Oral BID WC   enoxaparin  (LOVENOX ) injection  40 mg Subcutaneous Q24H   gabapentin   400 mg Oral TID   pantoprazole   40 mg Oral BID AC   rosuvastatin  20 mg Oral Daily   sacubitril-valsartan  1 tablet Oral BID   sodium chloride  flush  3 mL Intravenous Q12H   sucralfate   1 g Oral TID AC & HS   umeclidinium bromide  1 puff Inhalation Daily   Continuous Infusions:    LOS: 3 days   Erle Odell Castor  [facility]  02/10/2024, 7:57 AM

## 2024-02-10 NOTE — Progress Notes (Addendum)
 Progress Note  Patient Name: Carol Henderson Date of Encounter: 02/10/2024 [facility] Cardiologist: None    Interval Summary   Patient continues to have a cough that produces clear sputum with black flecks. She is most concerned about her ongoing abdominal pain. Feels very hot/sweaty.   Vital Signs Vitals:   02/09/24 1730 02/09/24 2005 02/10/24 0005 02/10/24 0503  BP: (!) 140/99 138/87 121/84 119/89  Pulse: 85 77 63 64  Resp:  16 18 19   Temp:  98.5 F (36.9 C) 98.4 F (36.9 C) 98.5 F (36.9 C)  TempSrc:  Oral Oral Oral  SpO2:  92% 91% 91%  Weight:      Height:        Intake/Output Summary (Last 24 hours) at 02/10/2024 0809 Last data filed at 02/09/2024 1620 Gross per 24 hour  Intake 123 ml  Output --  Net 123 ml      02/07/2024    7:36 PM 02/06/2024    2:24 PM 01/26/2024    7:59 PM  Last 3 Weights  Weight (lbs) 135 lb 9.3 oz 135 lb 9.6 oz 135 lb  Weight (kg) 61.5 kg 61.508 kg 61.236 kg      Telemetry/ECG  NSR - Personally Reviewed  Physical Exam  GEN: No acute distress. Sitting upright in the bed   Neck: No JVD Cardiac:  RRR, no murmurs, rubs, or gallops. R femoral cath site is soft, nontender  Respiratory: Wheezing throughout. Normal WOB on room air  GI: Soft, nontender, non-distended  MS: No edema  Assessment & Plan   Chest pain  Elevated Troponin  CAD  - Patient previously had DES to RCA and Lcx in 2020  - Patient had presented as a code STEMI. hsTn 32>50. cath showed moderate-severe multivessel CAD without any acute vessel occlusion or unstable lesion. Suspected presentation was related to RSV infection  - Recommended medical management  - Continue ASA 81 mg daily  - Continue carvedilol  3.125 mg BID  - Continue crestor 20 mg daily   Acute HFmrEF  Moderate-severe MR  - Previous echo in 2020 showed EF 50-55%, normal mitral valve  - Echocardiogram this admission showed EF 40-45%, grade I DD, moderate-severe mitral regurgitation due to  posterior leaflet restriction related to wall motion abnormality  - Euvolemic on exam  - Continue carvedilol  3.125 mg Bid, entresto 49-51 mg BID  - Consider outpatient addition of spiro, SGLT2i once patient has recovered from RSV   Patient follows with [facility]- instructed her to follow up with them after DC  Otherwise per primary  - Abdominal pain - Reported GI bleed  - RSV infection  - COPD/asthma   For questions or updates, please contact [facility] Please consult [email or web address] for contact info under       Signed, Rollo FABIENE Louder, PA-C   Patient seen, examined. Available data reviewed. Agree with findings, assessment, and plan as outlined by JORETTA Louder, PA-C. On my exam, she looks better today. Alert, oriented, NAD. Lungs with improved aeration but still with BL rhonchi, less wheezing. Heart RRR 2/6 systolic murmur at the LLSB, abdomen soft, mild tenderness, no rebound or guarding, extremities with no edema. Agree with current med Rx (carvedilol , entresto), BP now controlled. I think outpatient noncompliance with meds has been a significant issue. Otherwise stable from a cardiac perspective. Appreciate [facility] care of this patient. When medically stable, ok for discharge from cards perspective. Otherwise, as outlined above.  thanks  Ozell Fell, M.D. 02/10/2024  11:28 AM

## 2024-02-11 DIAGNOSIS — I2489 Other forms of acute ischemic heart disease: Secondary | ICD-10-CM | POA: Diagnosis not present

## 2024-02-11 LAB — CBC
HCT: 43 % (ref 36.0–46.0)
HCT: 38.2 % (ref 36.0–46.0)
Hemoglobin: 14.5 g/dL (ref 12.0–15.0)
Hemoglobin: 12.9 g/dL (ref 12.0–15.0)
MCH: 29.5 pg (ref 26.0–34.0)
MCH: 30 pg (ref 26.0–34.0)
MCHC: 33.7 g/dL (ref 30.0–36.0)
MCHC: 33.8 g/dL (ref 30.0–36.0)
MCV: 87.4 fL (ref 80.0–100.0)
MCV: 88.8 fL (ref 80.0–100.0)
Platelets: 344 K/uL (ref 150–400)
Platelets: 318 K/uL (ref 150–400)
RBC: 4.92 MIL/uL (ref 3.87–5.11)
RBC: 4.3 MIL/uL (ref 3.87–5.11)
RDW: 12.9 % (ref 11.5–15.5)
RDW: 13 % (ref 11.5–15.5)
WBC: 13.2 K/uL — ABNORMAL HIGH (ref 4.0–10.5)
WBC: 11.7 K/uL — ABNORMAL HIGH (ref 4.0–10.5)
nRBC: 0 % (ref 0.0–0.2)
nRBC: 0 % (ref 0.0–0.2)

## 2024-02-11 MED ORDER — MORPHINE SULFATE (PF) 4 MG/ML IV SOLN
4.0000 mg | INTRAVENOUS | Status: AC | PRN
  Administered 2024-02-11 – 2024-02-12 (×3): 4 mg via INTRAVENOUS
  Filled 2024-02-11 (×3): qty 1

## 2024-02-11 MED ORDER — ALUM & MAG HYDROXIDE-SIMETH 200-200-20 MG/5ML PO SUSP
30.0000 mL | ORAL | Status: DC | PRN
  Administered 2024-02-11: 30 mL via ORAL
  Filled 2024-02-11: qty 30

## 2024-02-11 MED ORDER — SODIUM CHLORIDE 0.9 % IV BOLUS
500.0000 mL | Freq: Once | INTRAVENOUS | Status: AC
  Administered 2024-02-11: 500 mL via INTRAVENOUS

## 2024-02-11 MED ORDER — INFLUENZA VIRUS VACC SPLIT PF (FLUZONE) 0.5 ML IM SUSY: 0.5000 mL | PREFILLED_SYRINGE | INTRAMUSCULAR | Status: DC

## 2024-02-11 MED ORDER — PNEUMOCOCCAL 20-VAL CONJ VACC 0.5 ML IM SUSY
0.5000 mL | PREFILLED_SYRINGE | INTRAMUSCULAR | Status: DC
  Filled 2024-02-11: qty 0.5

## 2024-02-11 NOTE — Progress Notes (Signed)
 RN called to patient room due to patient stating she was having chest pain.EKG and vitals were started. Dr.Feliz Celinda was notified about occurrence. MD stated that it was not cardiac related, to cancel EKG, and to give Mylanta for her heart burn. Mylanta given.

## 2024-02-11 NOTE — Plan of Care (Signed)

## 2024-02-11 NOTE — Progress Notes (Signed)
 RN called to patient room for chest pain rated 10/10 by patient. RN made MD aware of chest pain continuing regarding orders. MD placed order for morphine . RN gave morphine  and repositioned patient.

## 2024-02-11 NOTE — Progress Notes (Signed)
 [facility] PROGRESS NOTE    Progress Note  Carol Henderson  FMW:[id number] DOB: 1965/05/13 DOA: 02/07/2024 PCP: [name], MD     Brief Narrative:   Carol Henderson is an 58 y.o. female past medical history significant for CAD status post DES to the RCA and circumflex in 2020, COPD, vulvar cancer, essential hypertension and schizophrenia with bipolar disorder CVA tobacco abuse who came into the hospital for chest pain mildly elevated troponins likely demand ischemia in the setting of RSV infection 2D echo on admission showed normal LV function.  Taken emergently for left heart cath no culprit lesion found started having abdominal pain and melena patient denied any hematemesis.  Assessment/Plan:   Chest pain/ Elevated troponin: Initially admitted as a code STEMI, cardiology was took her for emergent cath no culprit lesion found. Cardiac biomarkers remain flat. Cardiology recommend to continue aspirin , Coreg , losartan  and Crestor.  Acute decompensated HFrEF: The echo showed an EF of 40% grade 1 diastolic dysfunction with mild apical inferior hypokinesia with moderate to severe mitral regurgitation. Cardiology recommended to continue GDMT continue Coreg  and Entresto.  Abdominal pain/melanotic stools: Patient was temporarily on heparin  reports bright red blood per rectum last weeks. CT scan of the abdomen showed no acute findings. Continue empirically on IV Protonix  and sucralfate . GI was consulted who recommended conservative management with PPI Carafate  for now. Hopefully EGD prior to discharge if she remains stable. Hemoglobin is stable, continue SCDs.  COPD/asthma/RSV infection: The setting of tobacco abuse. Chest x-ray showed no acute findings. She was wheezing on physical exam, actually saturating well on room air. She was started on inhalers. Will continue conservative management and supportive care.  Leukocytosis: Suspect reactive   DVT prophylaxis:  scd Family Communication:none Status is: Inpatient Remains inpatient appropriate because: Melanotic stools    Code Status:     Code Status Orders  (From admission, onward)           Start     Ordered   02/07/24 2114  Full code  Continuous       Question:  By:  Answer:  Consent: discussion documented in EHR   02/07/24 2116           Code Status History     Date Active Date Inactive Code Status Order ID Comments User Context   09/23/2018 0540 09/23/2018 1028 Full Code [id number]  Lawence Madison LABOR, MD ED   05/14/2018 0046 05/16/2018 1327 Full Code [id number]  Darron Deatrice LABOR, MD Inpatient   04/24/2018 2130 04/27/2018 1725 Full Code [id number]  Laurence Bridegroom, MD Inpatient   04/21/2017 1525 04/21/2017 2007 Full Code [id number]  Florencio Cara BIRCH, MD Inpatient   05/31/2016 1733 06/01/2016 1813 Full Code [id number]  Florencio Cara BIRCH, MD Inpatient   07/16/2015 1654 07/17/2015 1710 Full Code [id number]  Florencio Cara BIRCH, MD Inpatient         IV Access:   Peripheral IV   Procedures and diagnostic studies:   No results found.    Medical Consultants:   None.   Subjective:    Carol Henderson cont to have abd pain.  Objective:    Vitals:   02/11/24 0444 02/11/24 0447 02/11/24 0824 02/11/24 0856  BP:  117/73 124/78   Pulse:  69 82   Resp:   16   Temp:  (!) 97.5 F (36.4 C) 98.2 F (36.8 C)   TempSrc: (P) Oral Oral Oral   SpO2:  93% 92% 92%  Weight:  Height:       SpO2: 92 % O2 Flow Rate (L/min): 2 L/min   Intake/Output Summary (Last 24 hours) at 02/11/2024 0919 Last data filed at 02/11/2024 0824 Gross per 24 hour  Intake 490 ml  Output --  Net 490 ml   Filed Weights   02/07/24 1936  Weight: 61.5 kg    Exam: General exam: In no acute distress. Respiratory system: Good air movement and clear to auscultation. Cardiovascular system: S1 & S2 heard, RRR. No JVD. Gastrointestinal system: Positive bowel sounds soft epigastric tenderness no rebound or  guarding Extremities: No pedal edema. Skin: No rashes, lesions or ulcers Psychiatry: Judgement and insight appear normal. Mood & affect appropriate.  Data Reviewed:    Labs: Basic Metabolic Panel: Recent Labs  Lab 02/07/24 1948 02/07/24 2309 02/08/24 0316 02/09/24 0439 02/10/24 0405  NA 139  --  136 132* 131*  K 4.3  --  4.1 3.9 4.0  CL 99  --  101 100 102  CO2 23  --  21* 21* 20*  GLUCOSE 135*  --  148* 92 90  BUN 28*  --  35* 45* 35*  CREATININE 1.40* 1.48* 1.55* 1.36* 1.35*  CALCIUM  10.2  --  9.1 8.7* 8.5*  MG 2.2  --   --   --   --    GFR Estimated Creatinine Clearance: 37.2 mL/min (A) (by C-G formula based on SCr of 1.35 mg/dL (H)). Liver Function Tests: Recent Labs  Lab 02/07/24 1948  AST 22  ALT 12  ALKPHOS 121  BILITOT 0.3  PROT 8.8*  ALBUMIN 4.2   No results for input(s): LIPASE, AMYLASE in the last 168 hours. No results for input(s): AMMONIA in the last 168 hours. Coagulation profile Recent Labs  Lab 02/07/24 1948  INR 0.9   COVID-19 Labs  No results for input(s): DDIMER, FERRITIN, LDH, CRP in the last 72 hours.  Lab Results  Component Value Date   SARSCOV2NAA NEGATIVE 02/07/2024   SARSCOV2NAA NEGATIVE 12/20/2023    CBC: Recent Labs  Lab 02/07/24 1948 02/07/24 2309 02/08/24 0316 02/09/24 0439 02/10/24 0950 02/10/24 2007 02/11/24 0823  WBC 11.7*   < > 6.2 14.1* 12.4* 13.8* 13.2*  NEUTROABS 5.9  --   --   --   --   --   --   HGB 17.2*   < > 15.3* 14.0 14.4 14.6 14.5  HCT 49.7*   < > 46.2* 42.0 42.8 42.4 43.0  MCV 87.2   < > 90.1 88.2 87.7 86.4 87.4  PLT 460*   < > 403* 363 369 373 344   < > = values in this interval not displayed.   Cardiac Enzymes: No results for input(s): CKTOTAL, CKMB, CKMBINDEX, TROPONINI in the last 168 hours. BNP (last 3 results) Recent Labs    02/07/24 1948  PROBNP >35,000.0*   CBG: No results for input(s): GLUCAP in the last 168 hours. D-Dimer: No results for input(s):  DDIMER in the last 72 hours. Hgb A1c: No results for input(s): HGBA1C in the last 72 hours.  Lipid Profile: No results for input(s): CHOL, HDL, LDLCALC, TRIG, CHOLHDL, LDLDIRECT in the last 72 hours.  Thyroid  function studies: No results for input(s): TSH, T4TOTAL, T3FREE, THYROIDAB in the last 72 hours.  Invalid input(s): FREET3 Anemia work up: No results for input(s): VITAMINB12, FOLATE, FERRITIN, TIBC, IRON, RETICCTPCT in the last 72 hours. Sepsis Labs: Recent Labs  Lab 02/07/24 1948 02/07/24 2309 02/09/24 0439 02/10/24 0950 02/10/24 2007 02/11/24  0823  WBC 11.7*   < > 14.1* 12.4* 13.8* 13.2*  LATICACIDVEN 1.7  --  0.7  --   --   --    < > = values in this interval not displayed.   Microbiology Recent Results (from the past 240 hours)  Resp panel by RT-PCR (RSV, Flu A&B, Covid) Anterior Nasal Swab     Status: Abnormal   Collection Time: 02/07/24  8:07 PM   Specimen: Anterior Nasal Swab  Result Value Ref Range Status   SARS Coronavirus 2 by RT PCR NEGATIVE NEGATIVE Final    Comment: (NOTE) SARS-CoV-2 target nucleic acids are NOT DETECTED.  The SARS-CoV-2 RNA is generally detectable in upper respiratory specimens during the acute phase of infection. The lowest concentration of SARS-CoV-2 viral copies this assay can detect is 138 copies/mL. A negative result does not preclude SARS-Cov-2 infection and should not be used as the sole basis for treatment or other patient management decisions. A negative result may occur with  improper specimen collection/handling, submission of specimen other than nasopharyngeal swab, presence of viral mutation(s) within the areas targeted by this assay, and inadequate number of viral copies(<138 copies/mL). A negative result must be combined with clinical observations, patient history, and epidemiological information. The expected result is Negative.  Fact Sheet for Patients:   bloggercourse.com  Fact Sheet for Healthcare Providers:  seriousbroker.it  This test is no t yet approved or cleared by the [place]  FDA and  has been authorized for detection and/or diagnosis of SARS-CoV-2 by FDA under an Emergency Use Authorization (EUA). This EUA will remain  in effect (meaning this test can be used) for the duration of the COVID-19 declaration under Section 564(b)(1) of the Act, 21 U.S.C.section 360bbb-3(b)(1), unless the authorization is terminated  or revoked sooner.       Influenza A by PCR NEGATIVE NEGATIVE Final   Influenza B by PCR NEGATIVE NEGATIVE Final    Comment: (NOTE) The Xpert Xpress SARS-CoV-2/FLU/RSV plus assay is intended as an aid in the diagnosis of influenza from Nasopharyngeal swab specimens and should not be used as a sole basis for treatment. Nasal washings and aspirates are unacceptable for Xpert Xpress SARS-CoV-2/FLU/RSV testing.  Fact Sheet for Patients: bloggercourse.com  Fact Sheet for Healthcare Providers: seriousbroker.it  This test is not yet approved or cleared by the [place]  FDA and has been authorized for detection and/or diagnosis of SARS-CoV-2 by FDA under an Emergency Use Authorization (EUA). This EUA will remain in effect (meaning this test can be used) for the duration of the COVID-19 declaration under Section 564(b)(1) of the Act, 21 U.S.C. section 360bbb-3(b)(1), unless the authorization is terminated or revoked.     Resp Syncytial Virus by PCR POSITIVE (A) NEGATIVE Final    Comment: (NOTE) Fact Sheet for Patients: bloggercourse.com  Fact Sheet for Healthcare Providers: seriousbroker.it  This test is not yet approved or cleared by the [place]  FDA and has been authorized for detection and/or diagnosis of SARS-CoV-2 by FDA under an Emergency Use  Authorization (EUA). This EUA will remain in effect (meaning this test can be used) for the duration of the COVID-19 declaration under Section 564(b)(1) of the Act, 21 U.S.C. section 360bbb-3(b)(1), unless the authorization is terminated or revoked.  Performed at [facility], [address] [place], [place] [address]   MRSA Next Gen by PCR, Nasal     Status: None   Collection Time: 02/07/24 10:09 PM   Specimen: Nasal Mucosa; Nasal Swab  Result Value Ref  Range Status   MRSA by PCR Next Gen NOT DETECTED NOT DETECTED Final    Comment: (NOTE) The GeneXpert MRSA Assay (FDA approved for NASAL specimens only), is one component of a comprehensive MRSA colonization surveillance program. It is not intended to diagnose MRSA infection nor to guide or monitor treatment for MRSA infections. Test performance is not FDA approved in patients less than 64 years old. Performed at [facility], [address] [place], [place] [address]      Medications:    aspirin  EC  81 mg Oral Daily   carvedilol   3.125 mg Oral BID WC   gabapentin   400 mg Oral TID   [START ON 02/12/2024] influenza vac split trivalent PF  0.5 mL Intramuscular Tomorrow-1000   pantoprazole   40 mg Oral BID AC   [START ON 02/12/2024] pneumococcal 20-valent conjugate vaccine  0.5 mL Intramuscular Tomorrow-1000   rosuvastatin  20 mg Oral Daily   sacubitril-valsartan  1 tablet Oral BID   sodium chloride  flush  3 mL Intravenous Q12H   sucralfate   1 g Oral TID AC & HS   umeclidinium bromide  1 puff Inhalation Daily   Continuous Infusions:    LOS: 4 days   Carol Henderson  [facility]  02/11/2024, 9:19 AM

## 2024-02-11 NOTE — Plan of Care (Signed)
  Problem: Elimination: Goal: Will not experience complications related to bowel motility Outcome: Not Progressing   Problem: Coping: Goal: Level of anxiety will decrease Outcome: Not Progressing   Problem: Activity: Goal: Risk for activity intolerance will decrease Outcome: Progressing   Problem: Clinical Measurements: Goal: Respiratory complications will improve Outcome: Progressing Goal: Cardiovascular complication will be avoided Outcome: Progressing

## 2024-02-12 DIAGNOSIS — B338 Other specified viral diseases: Secondary | ICD-10-CM | POA: Diagnosis not present

## 2024-02-12 DIAGNOSIS — I2489 Other forms of acute ischemic heart disease: Secondary | ICD-10-CM | POA: Diagnosis not present

## 2024-02-12 DIAGNOSIS — R1013 Epigastric pain: Secondary | ICD-10-CM | POA: Diagnosis not present

## 2024-02-12 LAB — CBC
HCT: 39.5 % (ref 36.0–46.0)
Hemoglobin: 13.3 g/dL (ref 12.0–15.0)
MCH: 29.8 pg (ref 26.0–34.0)
MCHC: 33.7 g/dL (ref 30.0–36.0)
MCV: 88.4 fL (ref 80.0–100.0)
Platelets: 332 K/uL (ref 150–400)
RBC: 4.47 MIL/uL (ref 3.87–5.11)
RDW: 12.8 % (ref 11.5–15.5)
WBC: 11.2 K/uL — ABNORMAL HIGH (ref 4.0–10.5)
nRBC: 0 % (ref 0.0–0.2)

## 2024-02-12 MED ORDER — CARVEDILOL 3.125 MG PO TABS
3.1250 mg | ORAL_TABLET | Freq: Two times a day (BID) | ORAL | Status: DC
  Administered 2024-02-12 – 2024-02-14 (×4): 3.125 mg via ORAL
  Filled 2024-02-12 (×4): qty 1

## 2024-02-12 MED ORDER — MORPHINE SULFATE (PF) 2 MG/ML IV SOLN
2.0000 mg | INTRAVENOUS | Status: DC | PRN
  Administered 2024-02-12 – 2024-02-14 (×7): 2 mg via INTRAVENOUS
  Filled 2024-02-12 (×8): qty 1

## 2024-02-12 MED ORDER — OXYCODONE HCL 5 MG PO TABS
10.0000 mg | ORAL_TABLET | ORAL | Status: DC | PRN
  Administered 2024-02-12 – 2024-02-14 (×8): 10 mg via ORAL
  Filled 2024-02-12 (×8): qty 2

## 2024-02-12 NOTE — H&P (View-Only) (Signed)
 Patient ID: Carol Henderson, female   DOB: 09/25/1965, 58 y.o.   MRN: [id number]    Progress Note   Subjective   Day #  4 CC; epigastric pain, history of chronic intermittent rectal bleeding noted with acute illness with chest pain, dyspnea, abnormal EKG, markedly elevated BNP with previous history of coronary artery disease and drug-eluting stent and underlying COPD  Emergent cath on admission moderate multivessel disease patent stents, no culprit lesion-to be demand ischemia RSV positive  Patient looks better today and says she is feeling much better, she denies any dyspnea, she is not requiring oxygen still coughing some.  She continues to complain of epigastric and hypogastric pain which has been present for months she looks much more comfortable today and is in good spirits she very much wants to have the endoscopy done before she goes home.  She does not mention any rectal bleeding today   Today-WBC 11.2/Hb 13.3/hematocrit 39.5 02/10/2024 sodium 131/potassium 4.0/BUN 35/creatinine 1.35     Objective   Vital signs in last 24 hours: Temp:  [98.1 F (36.7 C)-99.1 F (37.3 C)] 99.1 F (37.3 C) (11/09 0812) Pulse Rate:  [64-72] 72 (11/09 0453) Resp:  [16-20] 20 (11/09 0453) BP: (87-117)/(57-70) 105/67 (11/09 0812) SpO2:  [93 %-97 %] 93 % (11/09 0812) Last BM Date : 02/07/24 General:    Older white female in NAD Heart:  Regular rate and rhythm; no murmurs Lungs: Respirations even and unlabored, Gothard rhonchi still present Abdomen:  Soft, there is some tenderness in the hypogastrium, nondistended.  No guarding or rebound normal bowel sounds. Extremities:  Without edema. Neurologic:  Alert and oriented,  grossly normal neurologically. Psych:  Cooperative. Normal mood and affect.  Intake/Output from previous day: 11/08 0701 - 11/09 0700 In: 240 [P.O.:240] Out: -  Intake/Output this shift: No intake/output data recorded.  Lab Results: Recent Labs    02/11/24 0823  02/11/24 2037 02/12/24 0754  WBC 13.2* 11.7* 11.2*  HGB 14.5 12.9 13.3  HCT 43.0 38.2 39.5  PLT 344 318 332   BMET Recent Labs    02/10/24 0405  NA 131*  K 4.0  CL 102  CO2 20*  GLUCOSE 90  BUN 35*  CREATININE 1.35*  CALCIUM  8.5*   LFT No results for input(s): PROT, ALBUMIN, AST, ALT, ALKPHOS, BILITOT, BILIDIR, IBILI in the last 72 hours. PT/INR No results for input(s): LABPROT, INR in the last 72 hours.      Assessment / Plan:    #87 58 year old white female with persistent complaints of epigastric pain has been present over the past several months.  Patient has had some NSAID use, had previously been prescribed PPI but not taking on a regular basis. CT abdomen pelvis unremarkable Suspect chronic gastropathy/peptic ulcer disease  #2 admitted 4 days ago with chest pain, dyspnea, cough and had abnormal EKG concerning for NSTEMI critically elevated BNP He was taken to emergent cath and was found to have moderate multivessel disease but patent stents and no culprit lesion and therefore felt to have picture more consistent with demand ischemia in setting of acute respiratory infection/RSV  #3 coronary artery disease status post previous drug-eluting stents 2020 #4 acute RSV infection-medically improving no longer requiring oxygen #5 COPD #6 acute on chronic heart failure in setting of acute RSV infection, improving-EF here 40% #7 chronic complaints of rectal bleeding which sounds very small-volume and likely hemorrhoidal-colonoscopy 2018 had 1 adenomatous polyp Hemoglobin very stable here  #8 bipolar disorder, history of substance abuse  and schizophrenia  Plan; regular diet today, n.p.o. past midnight Will be scheduled for EGD with Dr. Leigh for tomorrow 02/13/2024.  Procedure was discussed in detail with the patient including indications risk and benefits and she is agreeable to proceed She has been on twice daily PPI, and Carafate  and clinically  looks better though still complaining of pain. She is asking about discharge tomorrow, and expect she will be okay for discharge postprocedure tomorrow      Principal Problem:   Elevated troponin Active Problems:   HYPERTENSION, BENIGN SYSTEMIC   Affective bipolar disorder (HCC)   Abdominal pain   Demand ischemia of myocardium (HCC)   RSV infection   Melanotic stools     LOS: 5 days   Annali Lybrand EsterwoodPA-C  02/12/2024, 11:34 AM

## 2024-02-12 NOTE — Progress Notes (Signed)
 Patient ID: Carol Henderson, female   DOB: 09/25/1965, 58 y.o.   MRN: [id number]    Progress Note   Subjective   Day #  4 CC; epigastric pain, history of chronic intermittent rectal bleeding noted with acute illness with chest pain, dyspnea, abnormal EKG, markedly elevated BNP with previous history of coronary artery disease and drug-eluting stent and underlying COPD  Emergent cath on admission moderate multivessel disease patent stents, no culprit lesion-to be demand ischemia RSV positive  Patient looks better today and says she is feeling much better, she denies any dyspnea, she is not requiring oxygen still coughing some.  She continues to complain of epigastric and hypogastric pain which has been present for months she looks much more comfortable today and is in good spirits she very much wants to have the endoscopy done before she goes home.  She does not mention any rectal bleeding today   Today-WBC 11.2/Hb 13.3/hematocrit 39.5 02/10/2024 sodium 131/potassium 4.0/BUN 35/creatinine 1.35     Objective   Vital signs in last 24 hours: Temp:  [98.1 F (36.7 C)-99.1 F (37.3 C)] 99.1 F (37.3 C) (11/09 0812) Pulse Rate:  [64-72] 72 (11/09 0453) Resp:  [16-20] 20 (11/09 0453) BP: (87-117)/(57-70) 105/67 (11/09 0812) SpO2:  [93 %-97 %] 93 % (11/09 0812) Last BM Date : 02/07/24 General:    Older white female in NAD Heart:  Regular rate and rhythm; no murmurs Lungs: Respirations even and unlabored, Gothard rhonchi still present Abdomen:  Soft, there is some tenderness in the hypogastrium, nondistended.  No guarding or rebound normal bowel sounds. Extremities:  Without edema. Neurologic:  Alert and oriented,  grossly normal neurologically. Psych:  Cooperative. Normal mood and affect.  Intake/Output from previous day: 11/08 0701 - 11/09 0700 In: 240 [P.O.:240] Out: -  Intake/Output this shift: No intake/output data recorded.  Lab Results: Recent Labs    02/11/24 0823  02/11/24 2037 02/12/24 0754  WBC 13.2* 11.7* 11.2*  HGB 14.5 12.9 13.3  HCT 43.0 38.2 39.5  PLT 344 318 332   BMET Recent Labs    02/10/24 0405  NA 131*  K 4.0  CL 102  CO2 20*  GLUCOSE 90  BUN 35*  CREATININE 1.35*  CALCIUM  8.5*   LFT No results for input(s): PROT, ALBUMIN, AST, ALT, ALKPHOS, BILITOT, BILIDIR, IBILI in the last 72 hours. PT/INR No results for input(s): LABPROT, INR in the last 72 hours.      Assessment / Plan:    #87 58 year old white female with persistent complaints of epigastric pain has been present over the past several months.  Patient has had some NSAID use, had previously been prescribed PPI but not taking on a regular basis. CT abdomen pelvis unremarkable Suspect chronic gastropathy/peptic ulcer disease  #2 admitted 4 days ago with chest pain, dyspnea, cough and had abnormal EKG concerning for NSTEMI critically elevated BNP He was taken to emergent cath and was found to have moderate multivessel disease but patent stents and no culprit lesion and therefore felt to have picture more consistent with demand ischemia in setting of acute respiratory infection/RSV  #3 coronary artery disease status post previous drug-eluting stents 2020 #4 acute RSV infection-medically improving no longer requiring oxygen #5 COPD #6 acute on chronic heart failure in setting of acute RSV infection, improving-EF here 40% #7 chronic complaints of rectal bleeding which sounds very small-volume and likely hemorrhoidal-colonoscopy 2018 had 1 adenomatous polyp Hemoglobin very stable here  #8 bipolar disorder, history of substance abuse  and schizophrenia  Plan; regular diet today, n.p.o. past midnight Will be scheduled for EGD with Dr. Leigh for tomorrow 02/13/2024.  Procedure was discussed in detail with the patient including indications risk and benefits and she is agreeable to proceed She has been on twice daily PPI, and Carafate  and clinically  looks better though still complaining of pain. She is asking about discharge tomorrow, and expect she will be okay for discharge postprocedure tomorrow      Principal Problem:   Elevated troponin Active Problems:   HYPERTENSION, BENIGN SYSTEMIC   Affective bipolar disorder (HCC)   Abdominal pain   Demand ischemia of myocardium (HCC)   RSV infection   Melanotic stools     LOS: 5 days   Carol Lybrand EsterwoodPA-C  02/12/2024, 11:34 AM

## 2024-02-12 NOTE — Plan of Care (Signed)

## 2024-02-12 NOTE — Progress Notes (Signed)
 [facility] PROGRESS NOTE    Progress Note  HILARI WETHINGTON  FMW:[id number] DOB: 1966-01-22 DOA: 02/07/2024 PCP: [name], MD     Brief Narrative:   Carol Henderson is an 58 y.o. female past medical history significant for CAD status post DES to the RCA and circumflex in 2020, COPD, vulvar cancer, essential hypertension and schizophrenia with bipolar disorder CVA tobacco abuse who came into the hospital for chest pain mildly elevated troponins likely demand ischemia in the setting of RSV infection 2D echo on admission showed normal LV function.  Taken emergently for left heart cath no culprit lesion found started having abdominal pain and melena patient denied any hematemesis.  Assessment/Plan:   Chest pain/ Elevated troponin: Initially admitted as a code STEMI, cardiology was took her for emergent cath no culprit lesion found. Cardiac biomarkers remain flat. Cardiology recommend to continue aspirin , Coreg , losartan  and Crestor.  Acute decompensated HFrEF: The echo showed an EF of 40% grade 1 diastolic dysfunction with mild apical inferior hypokinesia with moderate to severe mitral regurgitation. Cardiology recommended to continue GDMT  Coreg  and Entresto. She became hypotensive yesterday in the evening. Entresto and Coreg  was discontinued.   Blood pressures trending up resume Coreg  continue to hold Entresto.  Abdominal pain/melanotic stools: CT scan of the abdomen showed no acute findings.  At home she has been taking ibuprofen  and indomethacin Continue empirically on IV Protonix  and sucralfate . GI was consulted who recommended conservative management with PPI Carafate  for now. Continues to have significant epigastric abdominal pain.  COPD/asthma/RSV infection: The setting of tobacco abuse. Chest x-ray showed no acute findings. She was wheezing on physical exam, actually saturating well on room air. She was started on inhalers. Will continue conservative  management and supportive care.  Leukocytosis: Suspect reactive.  History of schizophrenia and bipolar disorder: Currently on Klonopin  and Neurontin .     DVT prophylaxis: scd Family Communication:none Status is: Inpatient Remains inpatient appropriate because: Melanotic stools    Code Status:     Code Status Orders  (From admission, onward)           Start     Ordered   02/07/24 2114  Full code  Continuous       Question:  By:  Answer:  Consent: discussion documented in EHR   02/07/24 2116           Code Status History     Date Active Date Inactive Code Status Order ID Comments User Context   09/23/2018 0540 09/23/2018 1028 Full Code [id number]  Lawence Madison LABOR, MD ED   05/14/2018 0046 05/16/2018 1327 Full Code [id number]  Darron Deatrice LABOR, MD Inpatient   04/24/2018 2130 04/27/2018 1725 Full Code [id number]  Laurence Bridegroom, MD Inpatient   04/21/2017 1525 04/21/2017 2007 Full Code [id number]  Florencio Cara BIRCH, MD Inpatient   05/31/2016 1733 06/01/2016 1813 Full Code [id number]  Florencio Cara BIRCH, MD Inpatient   07/16/2015 1654 07/17/2015 1710 Full Code [id number]  Florencio Cara BIRCH, MD Inpatient         IV Access:   Peripheral IV   Procedures and diagnostic studies:   No results found.    Medical Consultants:   None.   Subjective:    Carol Henderson is abdominal pain but is tolerating her diet.  Objective:    Vitals:   02/11/24 1637 02/11/24 1934 02/12/24 0031 02/12/24 0453  BP: 117/68 97/70 (!) 112/57 97/69  Pulse: 68 64 71 72  Resp: 18 18 19  20  Temp: 98.2 F (36.8 C) 98.7 F (37.1 C) 98.3 F (36.8 C) 98.1 F (36.7 C)  TempSrc: Oral Oral Oral Oral  SpO2: 97% 94% 94% 97%  Weight:      Height:       SpO2: 97 % O2 Flow Rate (L/min): 2 L/min   Intake/Output Summary (Last 24 hours) at 02/12/2024 0810 Last data filed at 02/11/2024 0824 Gross per 24 hour  Intake 240 ml  Output --  Net 240 ml   Filed Weights   02/07/24 1936  Weight: 61.5 kg     Exam: General exam: In no acute distress. Respiratory system: Good air movement and clear to auscultation. Cardiovascular system: S1 & S2 heard, RRR. No JVD. Gastrointestinal system: Bowel sounds soft epigastric tenderness no rebound or guarding Extremities: No pedal edema. Skin: No rashes, lesions or ulcers Psychiatry: Judgement and insight appear normal. Mood & affect appropriate.  Data Reviewed:    Labs: Basic Metabolic Panel: Recent Labs  Lab 02/07/24 1948 02/07/24 2309 02/08/24 0316 02/09/24 0439 02/10/24 0405  NA 139  --  136 132* 131*  K 4.3  --  4.1 3.9 4.0  CL 99  --  101 100 102  CO2 23  --  21* 21* 20*  GLUCOSE 135*  --  148* 92 90  BUN 28*  --  35* 45* 35*  CREATININE 1.40* 1.48* 1.55* 1.36* 1.35*  CALCIUM  10.2  --  9.1 8.7* 8.5*  MG 2.2  --   --   --   --    GFR Estimated Creatinine Clearance: 37.2 mL/min (A) (by C-G formula based on SCr of 1.35 mg/dL (H)). Liver Function Tests: Recent Labs  Lab 02/07/24 1948  AST 22  ALT 12  ALKPHOS 121  BILITOT 0.3  PROT 8.8*  ALBUMIN 4.2   No results for input(s): LIPASE, AMYLASE in the last 168 hours. No results for input(s): AMMONIA in the last 168 hours. Coagulation profile Recent Labs  Lab 02/07/24 1948  INR 0.9   COVID-19 Labs  No results for input(s): DDIMER, FERRITIN, LDH, CRP in the last 72 hours.  Lab Results  Component Value Date   SARSCOV2NAA NEGATIVE 02/07/2024   SARSCOV2NAA NEGATIVE 12/20/2023    CBC: Recent Labs  Lab 02/07/24 1948 02/07/24 2309 02/09/24 0439 02/10/24 0950 02/10/24 2007 02/11/24 0823 02/11/24 2037  WBC 11.7*   < > 14.1* 12.4* 13.8* 13.2* 11.7*  NEUTROABS 5.9  --   --   --   --   --   --   HGB 17.2*   < > 14.0 14.4 14.6 14.5 12.9  HCT 49.7*   < > 42.0 42.8 42.4 43.0 38.2  MCV 87.2   < > 88.2 87.7 86.4 87.4 88.8  PLT 460*   < > 363 369 373 344 318   < > = values in this interval not displayed.   Cardiac Enzymes: No results for input(s):  CKTOTAL, CKMB, CKMBINDEX, TROPONINI in the last 168 hours. BNP (last 3 results) Recent Labs    02/07/24 1948  PROBNP >35,000.0*   CBG: No results for input(s): GLUCAP in the last 168 hours. D-Dimer: No results for input(s): DDIMER in the last 72 hours. Hgb A1c: No results for input(s): HGBA1C in the last 72 hours.  Lipid Profile: No results for input(s): CHOL, HDL, LDLCALC, TRIG, CHOLHDL, LDLDIRECT in the last 72 hours.  Thyroid  function studies: No results for input(s): TSH, T4TOTAL, T3FREE, THYROIDAB in the last 72 hours.  Invalid  input(s): FREET3 Anemia work up: No results for input(s): VITAMINB12, FOLATE, FERRITIN, TIBC, IRON, RETICCTPCT in the last 72 hours. Sepsis Labs: Recent Labs  Lab 02/07/24 1948 02/07/24 2309 02/09/24 0439 02/10/24 0950 02/10/24 2007 02/11/24 0823 02/11/24 2037  WBC 11.7*   < > 14.1* 12.4* 13.8* 13.2* 11.7*  LATICACIDVEN 1.7  --  0.7  --   --   --   --    < > = values in this interval not displayed.   Microbiology Recent Results (from the past 240 hours)  Resp panel by RT-PCR (RSV, Flu A&B, Covid) Anterior Nasal Swab     Status: Abnormal   Collection Time: 02/07/24  8:07 PM   Specimen: Anterior Nasal Swab  Result Value Ref Range Status   SARS Coronavirus 2 by RT PCR NEGATIVE NEGATIVE Final    Comment: (NOTE) SARS-CoV-2 target nucleic acids are NOT DETECTED.  The SARS-CoV-2 RNA is generally detectable in upper respiratory specimens during the acute phase of infection. The lowest concentration of SARS-CoV-2 viral copies this assay can detect is 138 copies/mL. A negative result does not preclude SARS-Cov-2 infection and should not be used as the sole basis for treatment or other patient management decisions. A negative result may occur with  improper specimen collection/handling, submission of specimen other than nasopharyngeal swab, presence of viral mutation(s) within the areas targeted  by this assay, and inadequate number of viral copies(<138 copies/mL). A negative result must be combined with clinical observations, patient history, and epidemiological information. The expected result is Negative.  Fact Sheet for Patients:  bloggercourse.com  Fact Sheet for Healthcare Providers:  seriousbroker.it  This test is no t yet approved or cleared by the [place]  FDA and  has been authorized for detection and/or diagnosis of SARS-CoV-2 by FDA under an Emergency Use Authorization (EUA). This EUA will remain  in effect (meaning this test can be used) for the duration of the COVID-19 declaration under Section 564(b)(1) of the Act, 21 U.S.C.section 360bbb-3(b)(1), unless the authorization is terminated  or revoked sooner.       Influenza A by PCR NEGATIVE NEGATIVE Final   Influenza B by PCR NEGATIVE NEGATIVE Final    Comment: (NOTE) The Xpert Xpress SARS-CoV-2/FLU/RSV plus assay is intended as an aid in the diagnosis of influenza from Nasopharyngeal swab specimens and should not be used as a sole basis for treatment. Nasal washings and aspirates are unacceptable for Xpert Xpress SARS-CoV-2/FLU/RSV testing.  Fact Sheet for Patients: bloggercourse.com  Fact Sheet for Healthcare Providers: seriousbroker.it  This test is not yet approved or cleared by the [place]  FDA and has been authorized for detection and/or diagnosis of SARS-CoV-2 by FDA under an Emergency Use Authorization (EUA). This EUA will remain in effect (meaning this test can be used) for the duration of the COVID-19 declaration under Section 564(b)(1) of the Act, 21 U.S.C. section 360bbb-3(b)(1), unless the authorization is terminated or revoked.     Resp Syncytial Virus by PCR POSITIVE (A) NEGATIVE Final    Comment: (NOTE) Fact Sheet for Patients: bloggercourse.com  Fact  Sheet for Healthcare Providers: seriousbroker.it  This test is not yet approved or cleared by the [place]  FDA and has been authorized for detection and/or diagnosis of SARS-CoV-2 by FDA under an Emergency Use Authorization (EUA). This EUA will remain in effect (meaning this test can be used) for the duration of the COVID-19 declaration under Section 564(b)(1) of the Act, 21 U.S.C. section 360bbb-3(b)(1), unless the authorization is terminated or revoked.  Performed at [facility], [address] [place], [place] [address]   MRSA Next Gen by PCR, Nasal     Status: None   Collection Time: 02/07/24 10:09 PM   Specimen: Nasal Mucosa; Nasal Swab  Result Value Ref Range Status   MRSA by PCR Next Gen NOT DETECTED NOT DETECTED Final    Comment: (NOTE) The GeneXpert MRSA Assay (FDA approved for NASAL specimens only), is one component of a comprehensive MRSA colonization surveillance program. It is not intended to diagnose MRSA infection nor to guide or monitor treatment for MRSA infections. Test performance is not FDA approved in patients less than 87 years old. Performed at [facility], [address] [place], [place] [address]      Medications:    aspirin  EC  81 mg Oral Daily   gabapentin   400 mg Oral TID   influenza vac split trivalent PF  0.5 mL Intramuscular Tomorrow-1000   pantoprazole   40 mg Oral BID AC   pneumococcal 20-valent conjugate vaccine  0.5 mL Intramuscular Tomorrow-1000   rosuvastatin  20 mg Oral Daily   sodium chloride  flush  3 mL Intravenous Q12H   sucralfate   1 g Oral TID AC & HS   umeclidinium bromide  1 puff Inhalation Daily   Continuous Infusions:    LOS: 5 days   Erle Odell Castor  [facility]  02/12/2024, 8:10 AM

## 2024-02-12 NOTE — Plan of Care (Signed)
  Problem: Clinical Measurements: Goal: Respiratory complications will improve Outcome: Progressing Goal: Cardiovascular complication will be avoided Outcome: Progressing   Problem: Activity: Goal: Risk for activity intolerance will decrease Outcome: Progressing   Problem: Coping: Goal: Level of anxiety will decrease Outcome: Not Progressing   Problem: Pain Managment: Goal: General experience of comfort will improve and/or be controlled Outcome: Not Progressing

## 2024-02-13 ENCOUNTER — Inpatient Hospital Stay (HOSPITAL_COMMUNITY): Admitting: Registered Nurse

## 2024-02-13 ENCOUNTER — Encounter (HOSPITAL_COMMUNITY): Payer: Self-pay | Admitting: *Deleted

## 2024-02-13 ENCOUNTER — Encounter (HOSPITAL_COMMUNITY): Admission: EM | Disposition: A | Payer: Self-pay | Source: Home / Self Care | Attending: Internal Medicine

## 2024-02-13 ENCOUNTER — Inpatient Hospital Stay (HOSPITAL_COMMUNITY)

## 2024-02-13 DIAGNOSIS — K259 Gastric ulcer, unspecified as acute or chronic, without hemorrhage or perforation: Secondary | ICD-10-CM

## 2024-02-13 DIAGNOSIS — I25119 Atherosclerotic heart disease of native coronary artery with unspecified angina pectoris: Secondary | ICD-10-CM

## 2024-02-13 DIAGNOSIS — Q399 Congenital malformation of esophagus, unspecified: Secondary | ICD-10-CM

## 2024-02-13 DIAGNOSIS — B338 Other specified viral diseases: Secondary | ICD-10-CM | POA: Diagnosis not present

## 2024-02-13 DIAGNOSIS — F17210 Nicotine dependence, cigarettes, uncomplicated: Secondary | ICD-10-CM

## 2024-02-13 DIAGNOSIS — K2960 Other gastritis without bleeding: Secondary | ICD-10-CM

## 2024-02-13 DIAGNOSIS — K3189 Other diseases of stomach and duodenum: Secondary | ICD-10-CM | POA: Diagnosis not present

## 2024-02-13 DIAGNOSIS — R1013 Epigastric pain: Secondary | ICD-10-CM | POA: Diagnosis not present

## 2024-02-13 HISTORY — PX: ESOPHAGOGASTRODUODENOSCOPY: SHX5428

## 2024-02-13 SURGERY — EGD (ESOPHAGOGASTRODUODENOSCOPY)
Anesthesia: Monitor Anesthesia Care

## 2024-02-13 MED ORDER — SODIUM CHLORIDE 0.9 % IV SOLN
INTRAVENOUS | Status: AC | PRN
  Administered 2024-02-13: 500 mL via INTRAVENOUS

## 2024-02-13 MED ORDER — PROPOFOL 10 MG/ML IV BOLUS
INTRAVENOUS | Status: DC | PRN
  Administered 2024-02-13: 20 mg via INTRAVENOUS
  Administered 2024-02-13: 100 ug/kg/min via INTRAVENOUS
  Administered 2024-02-13 (×2): 30 mg via INTRAVENOUS

## 2024-02-13 MED ORDER — GLYCOPYRROLATE PF 0.2 MG/ML IJ SOSY
PREFILLED_SYRINGE | INTRAMUSCULAR | Status: DC | PRN
  Administered 2024-02-13: .1 mg via INTRAVENOUS

## 2024-02-13 MED ORDER — TRAZODONE HCL 50 MG PO TABS
25.0000 mg | ORAL_TABLET | Freq: Every evening | ORAL | Status: DC | PRN
Start: 2024-02-13 — End: 2024-02-14
  Administered 2024-02-13 (×2): 25 mg via ORAL
  Filled 2024-02-13 (×2): qty 1

## 2024-02-13 MED ORDER — PHENYLEPHRINE 80 MCG/ML (10ML) SYRINGE FOR IV PUSH (FOR BLOOD PRESSURE SUPPORT)
PREFILLED_SYRINGE | INTRAVENOUS | Status: DC | PRN
  Administered 2024-02-13 (×2): 80 ug via INTRAVENOUS

## 2024-02-13 MED ORDER — LIDOCAINE 2% (20 MG/ML) 5 ML SYRINGE
INTRAMUSCULAR | Status: DC | PRN
Start: 2024-02-13 — End: 2024-02-13
  Administered 2024-02-13: 60 mg via INTRAVENOUS

## 2024-02-13 NOTE — Interval H&P Note (Signed)
 History and Physical Interval Note: PAtient here for EGD to evaluate epigastric pain - she states ongoing for months at this point. Tenderness to the touch on exam. Endorses some occasional prandial worsening but mostly present all the time. She has had a history of NSAID use and not using PPI reliably at home. On BID and carafate  while inpatient. EGD to further evaluate. I have discussed risks / benefits and she understands and wishes to proceed. Further recommendations pending the results.   02/13/2024 9:36 AM  Carol Henderson Eth  has presented today for surgery, with the diagnosis of Persistent epigastric pain.  The various methods of treatment have been discussed with the patient and family. After consideration of risks, benefits and other options for treatment, the patient has consented to  Procedure(s): EGD (ESOPHAGOGASTRODUODENOSCOPY) (N/A) as a surgical intervention.  The patient's history has been reviewed, patient examined, no change in status, stable for surgery.  I have reviewed the patient's chart and labs.  Questions were answered to the patient's satisfaction.     [name]

## 2024-02-13 NOTE — Op Note (Signed)
 [facility] Patient Name: Carol Henderson Procedure Date : 02/13/2024 MRN: [id number] Attending MD: Carol SQUIBB. Leigh , MD, [id number] Date of Birth: May 25, 1965 CSN: [id number] Age: 58 Admit Type: Inpatient Procedure:                Upper GI endoscopy Indications:              Epigastric abdominal pain - negative CT - symptoms                            ongoing for months, history of NSAID use Providers:                Carol SQUIBB. Leigh, MD, Heather Ng, RN,                            Haskel Chris, Technician Referring MD:              Medicines:                Monitored Anesthesia Care Complications:            No immediate complications. Estimated blood loss:                            Minimal. Estimated Blood Loss:     Estimated blood loss was minimal. Procedure:                Pre-Anesthesia Assessment:                           - Prior to the procedure, a History and Physical                            was performed, and patient medications and                            allergies were reviewed. The patient's tolerance of                            previous anesthesia was also reviewed. The risks                            and benefits of the procedure and the sedation                            options and risks were discussed with the patient.                            All questions were answered, and informed consent                            was obtained. Prior Anticoagulants: The patient has                            taken no anticoagulant or antiplatelet agents. ASA  Grade Assessment: III - A patient with severe                            systemic disease. After reviewing the risks and                            benefits, the patient was deemed in satisfactory                            condition to undergo the procedure.                           After obtaining informed consent, the endoscope was                             passed under direct vision. Throughout the                            procedure, the patient's blood pressure, pulse, and                            oxygen saturations were monitored continuously. The                            [id number]-[id number] ([id number]) Olympus endoscope was introduced                            through the mouth, and advanced to the third part                            of duodenum. The upper GI endoscopy was                            accomplished without difficulty. The patient                            tolerated the procedure well. Scope In: Scope Out: Findings:      Esophagogastric landmarks were identified: the Z-line was found at 36       cm, the gastroesophageal junction was found at 36 cm and the upper       extent of the gastric folds was found at 36 cm from the incisors.      The examined esophagus was quite tortuous with changes of stasis.       Esophagus lavaged, no mucosal abnormalities.      The exam of the esophagus was otherwise normal.      One non-bleeding cratered gastric ulcer with a clean ulcer base (Forrest       Class III) was found in the gastric antrum. The lesion was 4 mm in       largest dimension.      Patchy mildly erythematous mucosa was found in the gastric antrum.      The exam of the stomach was otherwise normal.      Biopsies were taken with a cold forceps for Helicobacter pylori testing.      Mucosal changes were  found in the ampulla - it was prominent / bulging,       mucosa appeared atypical. Biopsies were taken with a cold forceps for       histology, rule out adenoma.      The exam of the duodenum was otherwise normal. Impression:               - Esophagogastric landmarks identified.                           - Tortuous esophagus.                           - Normal esophagus otherwise.                           - Non-bleeding gastric ulcer with a clean ulcer                            base (Forrest Class III).                            - Erythematous mucosa in the antrum.                           - Normal stomach otherwise - biopsies taken to rule                            out H pylori.                           - Mucosal changes of the ampulla as outlined.                            Biopsied.                           - Normal duodenum otherwise.                           Ulcer / gastritis could certainly cause epigastric                            pain, however ulcer is rather small, pain out of                            proportion to that finding alone. Would also                            consider RUQ US , rule out gallstones. Will await                            biopsies of the ampulla. CT shows no biliary ductal                            dilation or abnormality at the ampulla otherwise. Recommendation:           -  Return patient to hospital ward for ongoing care.                           - Advance diet as tolerated.                           - Continue present medications.                           - Continue protonix  BID and carafate                            - Avoid NSAIDs                           - Order RUQ US  - rule out gallstones                           - Await pathology results.                           - Carol Henderson will need colonoscopy as outpatient (pending                            evaluation at [facility] clinic)                           - Call with questions. Procedure Code(s):        --- Professional ---                           [phone], Esophagogastroduodenoscopy, flexible,                            transoral; with biopsy, single or multiple Diagnosis Code(s):        --- Professional ---                           Q39.9, Congenital malformation of esophagus,                            unspecified                           K25.9, Gastric ulcer, unspecified as acute or                            chronic, without hemorrhage or perforation                           K31.89, Other diseases of  stomach and duodenum                           R10.13, Epigastric pain CPT copyright 2022 American Medical Association. All rights reserved. The codes documented in this report are preliminary and upon coder review may  be revised to meet current compliance requirements. Carol P. Loc Feinstein, MD 02/13/2024 10:38:36 AM This  report has been signed electronically. Number of Addenda: 0

## 2024-02-13 NOTE — Progress Notes (Signed)
 [facility] PROGRESS NOTE    Progress Note  [name]  FMW:[id number] DOB: Aug 24, 1965 DOA: 02/07/2024 PCP: [name], MD    Brief Narrative:   Carol Henderson is an 58 y.o. female past medical history significant for CAD status post DES to the RCA and circumflex in 2020, COPD, vulvar cancer, essential hypertension and schizophrenia with bipolar disorder,  CVA, tobacco abuse who came into the hospital for chest pain mildly elevated troponins likely demand ischemia in the setting of RSV infection 2D echo on admission showed normal LV function.  Taken emergently for left heart cath no culprit lesion found started having abdominal pain and melena patient denied any hematemesis.  Assessment/Plan:   Chest pain/ Elevated troponin's: Initially admitted as a code STEMI, cardiology was took her for emergent cath no culprit lesion found. Cardiac biomarkers remain flat. Cardiology recommend to continue aspirin , Coreg  and Crestor.  Acute decompensated HFrEF: The echo showed an EF of 40% grade 1 diastolic dysfunction with mild apical inferior hypokinesia with moderate to severe mitral regurgitation. Cardiology recommended to continue GDMT  Coreg  and Entresto. She became hypotensive on 02/11/2024 in the evening. Entresto was discontinued.   Now on Coreg , her blood pressure has been well-controlled. After EGD will see if she tolerates losartan .  She did not tolerate Entresto as she became hypotensive.  Abdominal pain/melanotic stools: CT scan of the abdomen showed no acute findings.  At home she has been taking ibuprofen  and indomethacin Continue empirically on IV Protonix  and sucralfate . GI was consulted who recommended conservative management with PPI Carafate , EGD on 02/13/2024.  COPD/asthma/RSV infection: The setting of tobacco abuse. Chest x-ray showed no acute findings. Wheezing has resolved, satting greater than 94% on room air. Continue inhalers. Will continue  conservative management and supportive care.  Leukocytosis: Suspect reactive.  History of schizophrenia and bipolar disorder: Currently on Klonopin  and Neurontin .  History of CVA: Continue aspirin  and statins.  History of vulvar cancer: Stage II status post weekly cisplatin and radiation in 2015    DVT prophylaxis: scd Family Communication:none Status is: Inpatient Remains inpatient appropriate because: Melanotic stools    Code Status:     Code Status Orders  (From admission, onward)           Start     Ordered   02/07/24 2114  Full code  Continuous       Question:  By:  Answer:  Consent: discussion documented in EHR   02/07/24 2116           Code Status History     Date Active Date Inactive Code Status Order ID Comments User Context   09/23/2018 0540 09/23/2018 1028 Full Code [id number]  Lawence Madison LABOR, MD ED   05/14/2018 0046 05/16/2018 1327 Full Code [id number]  Darron Deatrice LABOR, MD Inpatient   04/24/2018 2130 04/27/2018 1725 Full Code [id number]  Laurence Bridegroom, MD Inpatient   04/21/2017 1525 04/21/2017 2007 Full Code [id number]  Florencio Cara BIRCH, MD Inpatient   05/31/2016 1733 06/01/2016 1813 Full Code [id number]  Florencio Cara BIRCH, MD Inpatient   07/16/2015 1654 07/17/2015 1710 Full Code [id number]  Florencio Cara BIRCH, MD Inpatient         IV Access:   Peripheral IV   Procedures and diagnostic studies:   No results found.    Medical Consultants:   None.   Subjective:    Carol Henderson is abdominal pain but is tolerating her diet.  Objective:    Vitals:  02/12/24 2143 02/13/24 0024 02/13/24 0745 02/13/24 0807  BP: 108/75 121/78 112/75   Pulse:  71 81 87  Resp:  18 20 20   Temp:  98.5 F (36.9 C) 99 F (37.2 C)   TempSrc:  Oral Oral   SpO2:  98% 94% 92%  Weight:      Height:       SpO2: 92 % O2 Flow Rate (L/min): 2 L/min  No intake or output data in the 24 hours ending 02/13/24 0820  Filed Weights   02/07/24 1936  Weight: 61.5 kg     Exam: General exam: In no acute distress. Respiratory system: Good air movement and clear to auscultation. Cardiovascular system: S1 & S2 heard, RRR. No JVD. Gastrointestinal system: Bowel sounds soft epigastric tenderness no rebound or guarding Extremities: No pedal edema. Skin: No rashes, lesions or ulcers Psychiatry: Judgement and insight appear normal. Mood & affect appropriate.  Data Reviewed:    Labs: Basic Metabolic Panel: Recent Labs  Lab 02/07/24 1948 02/07/24 2309 02/08/24 0316 02/09/24 0439 02/10/24 0405  NA 139  --  136 132* 131*  K 4.3  --  4.1 3.9 4.0  CL 99  --  101 100 102  CO2 23  --  21* 21* 20*  GLUCOSE 135*  --  148* 92 90  BUN 28*  --  35* 45* 35*  CREATININE 1.40* 1.48* 1.55* 1.36* 1.35*  CALCIUM  10.2  --  9.1 8.7* 8.5*  MG 2.2  --   --   --   --    GFR Estimated Creatinine Clearance: 37.2 mL/min (A) (by C-G formula based on SCr of 1.35 mg/dL (H)). Liver Function Tests: Recent Labs  Lab 02/07/24 1948  AST 22  ALT 12  ALKPHOS 121  BILITOT 0.3  PROT 8.8*  ALBUMIN 4.2   No results for input(s): LIPASE, AMYLASE in the last 168 hours. No results for input(s): AMMONIA in the last 168 hours. Coagulation profile Recent Labs  Lab 02/07/24 1948  INR 0.9   COVID-19 Labs  No results for input(s): DDIMER, FERRITIN, LDH, CRP in the last 72 hours.  Lab Results  Component Value Date   SARSCOV2NAA NEGATIVE 02/07/2024   SARSCOV2NAA NEGATIVE 12/20/2023    CBC: Recent Labs  Lab 02/07/24 1948 02/07/24 2309 02/10/24 0950 02/10/24 2007 02/11/24 0823 02/11/24 2037 02/12/24 0754  WBC 11.7*   < > 12.4* 13.8* 13.2* 11.7* 11.2*  NEUTROABS 5.9  --   --   --   --   --   --   HGB 17.2*   < > 14.4 14.6 14.5 12.9 13.3  HCT 49.7*   < > 42.8 42.4 43.0 38.2 39.5  MCV 87.2   < > 87.7 86.4 87.4 88.8 88.4  PLT 460*   < > 369 373 344 318 332   < > = values in this interval not displayed.   Cardiac Enzymes: No results for input(s):  CKTOTAL, CKMB, CKMBINDEX, TROPONINI in the last 168 hours. BNP (last 3 results) Recent Labs    02/07/24 1948  PROBNP >35,000.0*   CBG: No results for input(s): GLUCAP in the last 168 hours. D-Dimer: No results for input(s): DDIMER in the last 72 hours. Hgb A1c: No results for input(s): HGBA1C in the last 72 hours.  Lipid Profile: No results for input(s): CHOL, HDL, LDLCALC, TRIG, CHOLHDL, LDLDIRECT in the last 72 hours.  Thyroid  function studies: No results for input(s): TSH, T4TOTAL, T3FREE, THYROIDAB in the last 72 hours.  Invalid  input(s): FREET3 Anemia work up: No results for input(s): VITAMINB12, FOLATE, FERRITIN, TIBC, IRON, RETICCTPCT in the last 72 hours. Sepsis Labs: Recent Labs  Lab 02/07/24 1948 02/07/24 2309 02/09/24 0439 02/10/24 0950 02/10/24 2007 02/11/24 0823 02/11/24 2037 02/12/24 0754  WBC 11.7*   < > 14.1*   < > 13.8* 13.2* 11.7* 11.2*  LATICACIDVEN 1.7  --  0.7  --   --   --   --   --    < > = values in this interval not displayed.   Microbiology Recent Results (from the past 240 hours)  Resp panel by RT-PCR (RSV, Flu A&B, Covid) Anterior Nasal Swab     Status: Abnormal   Collection Time: 02/07/24  8:07 PM   Specimen: Anterior Nasal Swab  Result Value Ref Range Status   SARS Coronavirus 2 by RT PCR NEGATIVE NEGATIVE Final    Comment: (NOTE) SARS-CoV-2 target nucleic acids are NOT DETECTED.  The SARS-CoV-2 RNA is generally detectable in upper respiratory specimens during the acute phase of infection. The lowest concentration of SARS-CoV-2 viral copies this assay can detect is 138 copies/mL. A negative result does not preclude SARS-Cov-2 infection and should not be used as the sole basis for treatment or other patient management decisions. A negative result may occur with  improper specimen collection/handling, submission of specimen other than nasopharyngeal swab, presence of viral mutation(s)  within the areas targeted by this assay, and inadequate number of viral copies(<138 copies/mL). A negative result must be combined with clinical observations, patient history, and epidemiological information. The expected result is Negative.  Fact Sheet for Patients:  bloggercourse.com  Fact Sheet for Healthcare Providers:  seriousbroker.it  This test is no t yet approved or cleared by the [place]  FDA and  has been authorized for detection and/or diagnosis of SARS-CoV-2 by FDA under an Emergency Use Authorization (EUA). This EUA will remain  in effect (meaning this test can be used) for the duration of the COVID-19 declaration under Section 564(b)(1) of the Act, 21 U.S.C.section 360bbb-3(b)(1), unless the authorization is terminated  or revoked sooner.       Influenza A by PCR NEGATIVE NEGATIVE Final   Influenza B by PCR NEGATIVE NEGATIVE Final    Comment: (NOTE) The Xpert Xpress SARS-CoV-2/FLU/RSV plus assay is intended as an aid in the diagnosis of influenza from Nasopharyngeal swab specimens and should not be used as a sole basis for treatment. Nasal washings and aspirates are unacceptable for Xpert Xpress SARS-CoV-2/FLU/RSV testing.  Fact Sheet for Patients: bloggercourse.com  Fact Sheet for Healthcare Providers: seriousbroker.it  This test is not yet approved or cleared by the [place]  FDA and has been authorized for detection and/or diagnosis of SARS-CoV-2 by FDA under an Emergency Use Authorization (EUA). This EUA will remain in effect (meaning this test can be used) for the duration of the COVID-19 declaration under Section 564(b)(1) of the Act, 21 U.S.C. section 360bbb-3(b)(1), unless the authorization is terminated or revoked.     Resp Syncytial Virus by PCR POSITIVE (A) NEGATIVE Final    Comment: (NOTE) Fact Sheet for  Patients: bloggercourse.com  Fact Sheet for Healthcare Providers: seriousbroker.it  This test is not yet approved or cleared by the [place]  FDA and has been authorized for detection and/or diagnosis of SARS-CoV-2 by FDA under an Emergency Use Authorization (EUA). This EUA will remain in effect (meaning this test can be used) for the duration of the COVID-19 declaration under Section 564(b)(1) of the Act, 21 U.S.C. section  360bbb-3(b)(1), unless the authorization is terminated or revoked.  Performed at [facility], [address], [place], [place] [address]   MRSA Next Gen by PCR, Nasal     Status: None   Collection Time: 02/07/24 10:09 PM   Specimen: Nasal Mucosa; Nasal Swab  Result Value Ref Range Status   MRSA by PCR Next Gen NOT DETECTED NOT DETECTED Final    Comment: (NOTE) The GeneXpert MRSA Assay (FDA approved for NASAL specimens only), is one component of a comprehensive MRSA colonization surveillance program. It is not intended to diagnose MRSA infection nor to guide or monitor treatment for MRSA infections. Test performance is not FDA approved in patients less than 67 years old. Performed at [facility], [address] [place], [place] [address]      Medications:    aspirin  EC  81 mg Oral Daily   carvedilol   3.125 mg Oral BID WC   gabapentin   400 mg Oral TID   influenza vac split trivalent PF  0.5 mL Intramuscular Tomorrow-1000   pantoprazole   40 mg Oral BID AC   pneumococcal 20-valent conjugate vaccine  0.5 mL Intramuscular Tomorrow-1000   rosuvastatin  20 mg Oral Daily   sodium chloride  flush  3 mL Intravenous Q12H   sucralfate   1 g Oral TID AC & HS   umeclidinium bromide  1 puff Inhalation Daily   Continuous Infusions:    LOS: 6 days   Erle Odell Castor  [facility]  02/13/2024, 8:20 AM

## 2024-02-13 NOTE — Anesthesia Preprocedure Evaluation (Addendum)
 Anesthesia Evaluation  Patient identified by MRN, date of birth, ID band Patient awake    Reviewed: Allergy & Precautions, NPO status , Patient's Chart, lab work & pertinent test results, reviewed documented beta blocker date and time   Airway Mallampati: II  TM Distance: >3 FB Neck ROM: Full    Dental  (+) Edentulous Upper, Edentulous Lower, Dental Advisory Given   Pulmonary asthma , COPD, Current Smoker and Patient abstained from smoking.   Pulmonary exam normal breath sounds clear to auscultation       Cardiovascular hypertension, Pt. on home beta blockers and Pt. on medications + angina  + CAD, + Past MI, + Cardiac Stents and + Peripheral Vascular Disease  Normal cardiovascular exam+ Valvular Problems/Murmurs MR  Rhythm:Regular Rate:Normal  TTE 2025 1. Mid to apical inferior and inferolateral hypokinesis. Left ventricular  ejection fraction, by estimation, is 40 to 45%. The left ventricle has  mildly decreased function. The left ventricle demonstrates regional wall  motion abnormalities (see scoring  diagram/findings for description). There is mild concentric left  ventricular hypertrophy. Left ventricular diastolic parameters are  consistent with Grade I diastolic dysfunction (impaired relaxation).   2. Right ventricular systolic function is normal. The right ventricular  size is normal. Tricuspid regurgitation signal is inadequate for assessing  PA pressure.   3. There is moderate to severe mitral regurgitation due to posterior  leaflet restriction related to wall motion abnormality.. The mitral valve  is abnormal. Moderate mitral valve regurgitation. No evidence of mitral  stenosis.   4. The aortic valve has an indeterminant number of cusps. Aortic valve  regurgitation is not visualized. No aortic stenosis is present.   5. The inferior vena cava is normal in size with greater than 50%  respiratory variability, suggesting  right atrial pressure of 3 mmHg.     Neuro/Psych  Headaches PSYCHIATRIC DISORDERS Anxiety Depression Bipolar Disorder Schizophrenia     GI/Hepatic PUD,GERD  ,,(+)     substance abuse  cocaine use, Hepatitis -, C  Endo/Other  negative endocrine ROS    Renal/GU Renal InsufficiencyRenal disease  negative genitourinary   Musculoskeletal negative musculoskeletal ROS (+)    Abdominal   Peds  Hematology negative hematology ROS (+)   Anesthesia Other Findings   Reproductive/Obstetrics                              Anesthesia Physical Anesthesia Plan  ASA: 4  Anesthesia Plan: MAC   Post-op Pain Management:    Induction: Intravenous  PONV Risk Score and Plan: Propofol  infusion and Treatment may vary due to age or medical condition  Airway Management Planned: Natural Airway  Additional Equipment:   Intra-op Plan:   Post-operative Plan:   Informed Consent: I have reviewed the patients History and Physical, chart, labs and discussed the procedure including the risks, benefits and alternatives for the proposed anesthesia with the patient or authorized representative who has indicated his/her understanding and acceptance.     Dental advisory given  Plan Discussed with: CRNA  Anesthesia Plan Comments:          Anesthesia Quick Evaluation

## 2024-02-13 NOTE — Plan of Care (Signed)

## 2024-02-13 NOTE — Transfer of Care (Signed)
 Immediate Anesthesia Transfer of Care Note  Patient: Carol Henderson  Procedure(s) Performed: EGD (ESOPHAGOGASTRODUODENOSCOPY)  Patient Location: PACU  Anesthesia Type:MAC  Level of Consciousness: awake, alert , and oriented  Airway & Oxygen Therapy: Patient Spontanous Breathing  Post-op Assessment: Report given to RN and Post -op Vital signs reviewed and stable  Post vital signs: Reviewed and stable  Last Vitals:  Vitals Value Taken Time  BP 135/100 02/13/24 10:35  Temp    Pulse 83 02/13/24 10:37  Resp 17 02/13/24 10:37  SpO2 92 % 02/13/24 10:37  Vitals shown include unfiled device data.  Last Pain:  Vitals:   02/13/24 0925  TempSrc: Temporal  PainSc: 10-Worst pain ever      Patients Stated Pain Goal: 4 (02/13/24 9346)  Complications: There were no known notable events for this encounter.

## 2024-02-14 ENCOUNTER — Other Ambulatory Visit (HOSPITAL_COMMUNITY): Payer: Self-pay

## 2024-02-14 DIAGNOSIS — K921 Melena: Secondary | ICD-10-CM

## 2024-02-14 DIAGNOSIS — R7989 Other specified abnormal findings of blood chemistry: Secondary | ICD-10-CM | POA: Diagnosis not present

## 2024-02-14 DIAGNOSIS — K219 Gastro-esophageal reflux disease without esophagitis: Secondary | ICD-10-CM

## 2024-02-14 DIAGNOSIS — K279 Peptic ulcer, site unspecified, unspecified as acute or chronic, without hemorrhage or perforation: Secondary | ICD-10-CM

## 2024-02-14 LAB — SURGICAL PATHOLOGY

## 2024-02-14 MED ORDER — SUCRALFATE 1 GM/10ML PO SUSP
1.0000 g | Freq: Three times a day (TID) | ORAL | 0 refills | Status: AC
  Filled 2024-02-14: qty 473, 12d supply, fill #0

## 2024-02-14 MED ORDER — PANTOPRAZOLE SODIUM 40 MG PO TBEC
40.0000 mg | DELAYED_RELEASE_TABLET | Freq: Two times a day (BID) | ORAL | 3 refills | Status: AC
  Filled 2024-02-14: qty 90, 45d supply, fill #0

## 2024-02-14 MED ORDER — ROSUVASTATIN CALCIUM 20 MG PO TABS
20.0000 mg | ORAL_TABLET | Freq: Every day | ORAL | 1 refills | Status: AC
  Filled 2024-02-14: qty 90, 90d supply, fill #0

## 2024-02-14 NOTE — Care Management Important Message (Signed)
 Important Message  Patient Details  Name: Carol Henderson MRN: [id number] Date of Birth: 10/02/1965   Important Message Given:  Yes - Medicare IM     Vonzell Arrie Sharps 02/14/2024, 11:19 AM

## 2024-02-14 NOTE — Anesthesia Postprocedure Evaluation (Signed)
 Anesthesia Post Note  Patient: Carol Henderson  Procedure(s) Performed: EGD (ESOPHAGOGASTRODUODENOSCOPY)     Patient location during evaluation: Endoscopy Anesthesia Type: MAC Level of consciousness: awake and alert Pain management: pain level controlled Vital Signs Assessment: post-procedure vital signs reviewed and stable Respiratory status: spontaneous breathing, nonlabored ventilation, respiratory function stable and patient connected to nasal cannula oxygen Cardiovascular status: blood pressure returned to baseline and stable Postop Assessment: no apparent nausea or vomiting Anesthetic complications: no   There were no known notable events for this encounter.  Last Vitals:  Vitals:   02/14/24 0504 02/14/24 0756  BP: 134/77 139/80  Pulse: 73 62  Resp: 18 20  Temp: 36.9 C 37.4 C  SpO2: 91% 90%    Last Pain:  Vitals:   02/14/24 0808  TempSrc:   PainSc: 9                  [name]

## 2024-02-14 NOTE — Discharge Summary (Signed)
 Physician Discharge Summary  Carol Henderson FMW:[id number] DOB: 1965-10-21 DOA: 02/07/2024  PCP: [name], MD  Admit date: 02/07/2024 Discharge date: 02/14/2024  Admitted From: Home Disposition:  Home  Recommendations for Outpatient Follow-up:  Follow up with PCP in 1-2 weeks Please obtain BMP/CBC in one week   Home Health:No Equipment/Devices:None  Discharge Condition:Stable CODE STATUS:Full Diet recommendation: Heart Healthy  Brief/Interim Summary:  58 y.o. female past medical history significant for CAD status post DES to the RCA and circumflex in 2020, COPD, vulvar cancer, essential hypertension and schizophrenia with bipolar disorder,  CVA, tobacco abuse who came into the hospital for chest pain mildly elevated troponins likely demand ischemia in the setting of RSV infection 2D echo on admission showed normal LV function.  Taken emergently for left heart cath no culprit lesion found started having abdominal pain and melena patient denied any hematemesis.   Discharge Diagnoses:  Principal Problem:   Elevated troponin Active Problems:   HYPERTENSION, BENIGN SYSTEMIC   Affective bipolar disorder (HCC)   Abdominal pain   Demand ischemia of myocardium (HCC)   RSV infection   Melanotic stools  Chest pain/elevated troponin: Initially admitted as a code STEMI cardiology took her for emergent cath no culprit lesion found. They recommended to continue aspirin  Coreg  and Crestor.  Acute HFpEF/essential hypertension: 2D echo showed an EF of 40% continue Coreg  started on Entresto which she did not tolerate due to hypotension. Changed back to losartan .  Clonidine  is discontinued. Her blood pressure remained stable.  Peptic ulcer disease/melanotic stools: CT scan of the abdomen pelvis showed no acute findings. CT started on IV Protonix  and sucralfate . GI was consulted and performed an EGD that showed nonbleeding gastric ulcer. They recommend to continue Protonix  and  Carafate  as an outpatient and follow-up with them.  COPD/asthma/RSV: The setting of tobacco abuse. She is x-ray showed no acute findings. RSV was positive she was treated conservatively.  Leukocytosis: Likely reactive.  History of schizophrenia, Continue Klonopin  and Neurontin .  History of CVA: Continue aspirin  and statins.    Discharge Instructions  Discharge Instructions     AMB referral to Phase II Cardiac Rehabilitation   Complete by: As directed    CAD, pending revascularization   Diagnosis: Other   After initial evaluation and assessments completed: Virtual Based Care may be provided alone or in conjunction with Phase 2 Cardiac Rehab based on patient barriers.: Yes   Intensive Cardiac Rehabilitation (ICR) [facility] location only OR Traditional Cardiac Rehabilitation (TCR) *If criteria for ICR are not met will enroll in TCR [facility] only): Yes   Diet - low sodium heart healthy   Complete by: As directed    Increase activity slowly   Complete by: As directed       Allergies as of 02/14/2024       Reactions   Ativan  [lorazepam ] Other (See Comments)   Anger. Not allergic per pt.    Amitriptyline  Other (See Comments)   Agitation and anger        Medication List     STOP taking these medications    ALPRAZolam  1 MG tablet Commonly known as: XANAX    atorvastatin  80 MG tablet Commonly known as: LIPITOR   labetalol  100 MG tablet Commonly known as: NORMODYNE    predniSONE 20 MG tablet Commonly known as: DELTASONE       TAKE these medications    albuterol  108 (90 Base) MCG/ACT inhaler Commonly known as: VENTOLIN  HFA Inhale 2 puffs into the lungs every 6 (six) hours  as needed for wheezing or shortness of breath.   amLODipine  10 MG tablet Commonly known as: NORVASC  Take 1 tablet (10 mg total) by mouth daily.   carvedilol  3.125 MG tablet Commonly known as: COREG  Take 1 tablet (3.125 mg total) by mouth 2 (two) times daily with a meal.   cloNIDine  0.2 MG  tablet Commonly known as: CATAPRES  Take 0.2 mg by mouth 2 (two) times daily.   gabapentin  400 MG capsule Commonly known as: NEURONTIN  Take 1 capsule (400 mg total) by mouth 3 (three) times daily. Reported on 07/02/2015   losartan  100 MG tablet Commonly known as: COZAAR  Take 1 tablet (100 mg total) by mouth daily.   methocarbamol 500 MG tablet Commonly known as: ROBAXIN Take 1 tablet (500 mg total) by mouth 2 (two) times daily.   nitroGLYCERIN  0.4 MG SL tablet Commonly known as: NITROSTAT  Place 1 tablet (0.4 mg total) under the tongue every 5 (five) minutes as needed for chest pain.   ondansetron  4 MG disintegrating tablet Commonly known as: Zofran  ODT Take 1 tablet (4 mg total) by mouth every 8 (eight) hours as needed for nausea or vomiting.   pantoprazole  40 MG tablet Commonly known as: PROTONIX  Take 1 tablet (40 mg total) by mouth 2 (two) times daily. What changed: when to take this   rosuvastatin 20 MG tablet Commonly known as: CRESTOR Take 1 tablet (20 mg total) by mouth daily. Start taking on: February 15, 2024   Spiriva  Respimat 2.5 MCG/ACT Aers Generic drug: Tiotropium Bromide  Inhale 2 puffs into the lungs daily.   sucralfate  1 GM/10ML suspension Commonly known as: CARAFATE  Take 10 mLs (1 g total) by mouth 4 (four) times daily -  before meals and at bedtime.        Allergies  Allergen Reactions   Ativan  [Lorazepam ] Other (See Comments)    Anger. Not allergic per pt.    Amitriptyline  Other (See Comments)    Agitation and anger    Consultations: Cardiology GI   Procedures/Studies: US  Abdomen Complete Result Date: 02/13/2024 CLINICAL DATA:  Epigastric pain. EXAM: ABDOMEN ULTRASOUND COMPLETE COMPARISON:  CT abdomen pelvis 02/08/2024. FINDINGS: Gallbladder: Nonshadowing echogenic sludge. No wall thickening or sonographic Murphy sign. Common bile duct: Diameter: 7 mm, slightly prominent for age. Mild intrahepatic biliary ductal dilatation. Liver: No focal  lesion identified. Within normal limits in parenchymal echogenicity. Portal vein is patent on color Doppler imaging with normal direction of blood flow towards the liver. IVC: No abnormality visualized. Pancreas: Visualized portion unremarkable. Spleen: Size and appearance within normal limits. Right Kidney: Length: 10.9 cm. 1.1 cm cyst. Normal parenchymal echogenicity. No hydronephrosis. Left Kidney: Length: 9.4 cm. Echogenicity within normal limits. No mass or hydronephrosis visualized. Abdominal aorta: No aneurysm visualized. Other findings: None. IMPRESSION: 1. Mild intrahepatic and extrahepatic biliary ductal dilatation. If further evaluation is desired, outpatient MR abdomen/MRCP without and with contrast is suggested. 2. Gallbladder sludge. Electronically Signed   By: Newell Eke M.D.   On: 02/13/2024 17:22   CT ABDOMEN PELVIS WO CONTRAST Result Date: 02/08/2024 EXAM: CT ABDOMEN AND PELVIS WITH CONTRAST 02/08/2024 06:40:00 PM TECHNIQUE: CT of the abdomen and pelvis was performed with the administration of intravenous contrast. Multiplanar reformatted images are provided for review. Automated exposure control, iterative reconstruction, and/or weight-based adjustment of the mA/kV was utilized to reduce the radiation dose to as low as reasonably achievable. COMPARISON: 01/27/2024, 09/25/2018 CLINICAL HISTORY: Abdominal pain, acute, nonlocalized. FINDINGS: LOWER CHEST: Interval band-like densities in the lower lobes presumably atelectasis. LIVER:  The liver is unremarkable. GALLBLADDER AND BILE DUCTS: Small amount of hyperdense material in the gallbladder likely vicarious excretion. No biliary ductal dilatation. SPLEEN: No acute abnormality. PANCREAS: No acute abnormality. ADRENAL GLANDS: No acute abnormality. KIDNEYS, URETERS AND BLADDER: Small nonobstructing right kidney stone. Mild excreted contrast in the renal collecting systems and bladder. No hydronephrosis. No perinephric or periureteral stranding.  Urinary bladder is unremarkable. GI AND BOWEL: Stomach demonstrates no acute abnormality. Scattered fluid-filled loops of small bowel in the lower abdomen and pelvis without obstruction or wall thickening. PERITONEUM AND RETROPERITONEUM: No ascites. No free air. VASCULATURE: Aorta is normal in caliber. Moderate aortic atherosclerosis. LYMPH NODES: No lymphadenopathy. REPRODUCTIVE ORGANS: No acute abnormality. BONES AND SOFT TISSUES: No acute osseous abnormality. No focal soft tissue abnormality. IMPRESSION: 1. No acute findings in the abdomen or pelvis. 2. Few fluid-filled loops of small bowel in the abdomen and pelvis, nonspecific in appearance and without evidence for obstruction. 3. Nonobstructing right kidney stone 4. Moderate aortic atherosclerosis. Electronically signed by: Luke Bun MD 02/08/2024 07:05 PM EST RP Workstation: [id number]   CARDIAC CATHETERIZATION Addendum Date: 02/08/2024 Coronary angiography 02/07/2024: LM: Distal 30% stenosis LAD: Mid eccentric 80% stenosis Lcx: Ostial 50% stenosis         OM1 70% stenosis         Patent mid stent, minimal restenosis RCA: Dominant.           Prox and distal 30% disease           Patent mid stent, no significant restenosis LVEDP 9 mmHg Conclusion: Moderate to severe multivessel CAD without any acute vessel occlusion or unstable lesion Presentation likely related to RSV infection Recommend supportive care and medical management for CAD Consider ischemia guided LAD revascularization Newman JINNY Lawrence, MD   Result Date: 02/08/2024 Images from the original result were not included. Coronary angiography 02/07/2024: LM: Distal 30% stenosis LAD: Mid eccentric 80% stenosis Lcx: Ostial 50% stenosis         OM1 70% stenosis         Patent mid stent, minimal restenosis RCA: Dominant.           Prox and distal 30% disease           Patent mid stent, no significant restenosis LVEDP 9 mmHg Conclusion: Moderate to severe multivessel CAD with any acute vessel  occlusion or unstable lesion Presentation likely related to RSV infection Recommend supportive care and medical management for CAD Consider ischemia guided LAD revascularization Newman JINNY Lawrence, MD   ECHOCARDIOGRAM COMPLETE Result Date: 02/08/2024    ECHOCARDIOGRAM REPORT   Patient Name:   Carol Henderson Date of Exam: 02/08/2024 Medical Rec #:  [id number]      Height:       62.5 in Accession #:    [id number]     Weight:       135.6 lb Date of Birth:  25-Sep-1965     BSA:          1.630 m Patient Age:    57 years       BP:           142/112 mmHg Patient Gender: F              HR:           71 bpm. Exam Location:  Inpatient Procedure: 2D Echo, Cardiac Doppler and Color Doppler (Both Spectral and Color            Flow Doppler were utilized  during procedure). Indications:    Acute MI i24.9  History:        Patient has prior history of Echocardiogram examinations, most                 recent 05/14/2018. CAD, COPD; Risk Factors:Hypertension.  Sonographer:    Damien Senior RDCS Referring Phys: [id number] DAMARCUS A INGRAM IMPRESSIONS  1. Mid to apical inferior and inferolateral hypokinesis. Left ventricular ejection fraction, by estimation, is 40 to 45%. The left ventricle has mildly decreased function. The left ventricle demonstrates regional wall motion abnormalities (see scoring diagram/findings for description). There is mild concentric left ventricular hypertrophy. Left ventricular diastolic parameters are consistent with Grade I diastolic dysfunction (impaired relaxation).  2. Right ventricular systolic function is normal. The right ventricular size is normal. Tricuspid regurgitation signal is inadequate for assessing PA pressure.  3. There is moderate to severe mitral regurgitation due to posterior leaflet restriction related to wall motion abnormality.. The mitral valve is abnormal. Moderate mitral valve regurgitation. No evidence of mitral stenosis.  4. The aortic valve has an indeterminant number of cusps. Aortic  valve regurgitation is not visualized. No aortic stenosis is present.  5. The inferior vena cava is normal in size with greater than 50% respiratory variability, suggesting right atrial pressure of 3 mmHg. FINDINGS  Left Ventricle: Mid to apical inferior and inferolateral hypokinesis. Left ventricular ejection fraction, by estimation, is 40 to 45%. The left ventricle has mildly decreased function. The left ventricle demonstrates regional wall motion abnormalities. The left ventricular internal cavity size was normal in size. There is mild concentric left ventricular hypertrophy. Left ventricular diastolic function could not be evaluated due to mitral regurgitation (moderate or greater). Left ventricular diastolic parameters are consistent with Grade I diastolic dysfunction (impaired relaxation).  LV Wall Scoring: The mid and distal inferior wall and posterior wall are hypokinetic. The entire anterior wall, antero-lateral wall, entire septum, apical lateral segment, basal inferior segment, and apex are normal. Right Ventricle: The right ventricular size is normal. No increase in right ventricular wall thickness. Right ventricular systolic function is normal. Tricuspid regurgitation signal is inadequate for assessing PA pressure. Left Atrium: Left atrial size was normal in size. Right Atrium: Right atrial size was normal in size. Pericardium: There is no evidence of pericardial effusion. Mitral Valve: There is moderate to severe mitral regurgitation due to posterior leaflet restriction related to wall motion abnormality. The mitral valve is abnormal. Moderate mitral valve regurgitation, with eccentric anteriorly directed jet. No evidence  of mitral valve stenosis. Tricuspid Valve: The tricuspid valve is normal in structure. Tricuspid valve regurgitation is trivial. No evidence of tricuspid stenosis. Aortic Valve: The aortic valve has an indeterminant number of cusps. Aortic valve regurgitation is not visualized. No  aortic stenosis is present. Pulmonic Valve: The pulmonic valve was grossly normal. Pulmonic valve regurgitation is not visualized. No evidence of pulmonic stenosis. Aorta: The aortic root and ascending aorta are structurally normal, with no evidence of dilitation. Venous: A pattern of systolic flow reversal, suggestive of severe mitral regurgitation is recorded from the right lower pulmonary vein. The inferior vena cava is normal in size with greater than 50% respiratory variability, suggesting right atrial pressure of 3 mmHg. IAS/Shunts: No atrial level shunt detected by color flow Doppler.  LEFT VENTRICLE PLAX 2D LVIDd:         4.90 cm   Diastology LVIDs:         3.50 cm   LV e' lateral:   6.20 cm/s  LV PW:         1.30 cm   LV E/e' lateral: 8.9 LV IVS:        1.30 cm LVOT diam:     2.00 cm LV SV:         58 LV SV Index:   36 LVOT Area:     3.14 cm LV IVRT:       158 msec  RIGHT VENTRICLE RV S prime:     10.90 cm/s TAPSE (M-mode): 1.6 cm LEFT ATRIUM             Index        RIGHT ATRIUM           Index LA diam:        3.60 cm 2.21 cm/m   RA Area:     13.40 cm LA Vol (A2C):   45.5 ml 27.91 ml/m  RA Volume:   30.70 ml  18.83 ml/m LA Vol (A4C):   46.6 ml 28.58 ml/m LA Biplane Vol: 46.6 ml 28.58 ml/m  AORTIC VALVE LVOT Vmax:   110.00 cm/s LVOT Vmean:  78.900 cm/s LVOT VTI:    0.185 m  AORTA Ao Root diam: 2.70 cm Ao Asc diam:  3.40 cm MITRAL VALVE MV Area (PHT): 3.16 cm     SHUNTS MV Decel Time: 240 msec     Systemic VTI:  0.18 m MV E velocity: 55.00 cm/s   Systemic Diam: 2.00 cm MV A velocity: 115.00 cm/s MV E/A ratio:  0.48 Annabella Scarce MD Electronically signed by Annabella Scarce MD Signature Date/Time: 02/08/2024/1:06:23 PM    Final    DG Chest 2 View Result Date: 02/08/2024 EXAM: 2 VIEW(S) XRAY OF THE CHEST 02/06/2024 03:19:46 PM COMPARISON: 12/20/2023 CLINICAL HISTORY: COPD exacerbation. FINDINGS: LUNGS AND PLEURA: No focal pulmonary opacity. No pulmonary edema. No pleural effusion. No pneumothorax.  HEART AND MEDIASTINUM: No acute abnormality of the cardiac and mediastinal silhouettes. BONES AND SOFT TISSUES: No acute osseous abnormality. IMPRESSION: 1. No acute cardiopulmonary process detected. Electronically signed by: Luke Bun MD 02/08/2024 01:28 AM EST RP Workstation: [id number]   DG Chest Port 1 View Result Date: 02/07/2024 EXAM: 1 VIEW(S) XRAY OF THE CHEST 02/07/2024 08:09:00 PM COMPARISON: 02/06/2024 CLINICAL HISTORY: Cough FINDINGS: LUNGS AND PLEURA: No focal pulmonary opacity. No pulmonary edema. No pleural effusion. No pneumothorax. HEART AND MEDIASTINUM: No acute abnormality of the cardiac and mediastinal silhouettes. BONES AND SOFT TISSUES: No acute osseous abnormality. IMPRESSION: 1. No acute cardiopulmonary process identified. Electronically signed by: Franky Crease MD 02/07/2024 08:12 PM EST RP Workstation: [id number]   CT Renal Stone Study Result Date: 01/27/2024 CLINICAL DATA:  Left-sided flank pain, history of kidney stones EXAM: CT ABDOMEN AND PELVIS WITHOUT CONTRAST TECHNIQUE: Multidetector CT imaging of the abdomen and pelvis was performed following the standard protocol without IV contrast. RADIATION DOSE REDUCTION: This exam was performed according to the departmental dose-optimization program which includes automated exposure control, adjustment of the mA and/or kV according to patient size and/or use of iterative reconstruction technique. COMPARISON:  06/11/2020 FINDINGS: Lower chest: No acute abnormality. Hepatobiliary: No focal liver abnormality is seen. No gallstones, gallbladder wall thickening, or biliary dilatation. Pancreas: Unremarkable. No pancreatic ductal dilatation or surrounding inflammatory changes. Spleen: Normal in size without focal abnormality. Adrenals/Urinary Tract: Adrenal glands are within normal limits. Kidneys are well visualized bilaterally. Renal vascular calcifications are seen. Punctate nonobstructing renal calculi noted on the right. The ureters are  within normal limits bilaterally. The bladder is well  distended. Stomach/Bowel: No obstructive or inflammatory changes of colon are seen. The appendix is within normal limits. Fluid-filled loops of small bowel are noted without definitive obstructive change. This may represent some mild enteritis. Stomach is within normal limits. Vascular/Lymphatic: Aortic atherosclerosis. No enlarged abdominal or pelvic lymph nodes. Reproductive: Uterus and bilateral adnexa are unremarkable. Other: No abdominal wall hernia or abnormality. No abdominopelvic ascites. Musculoskeletal: No acute or significant osseous findings. IMPRESSION: Punctate nonobstructing right renal stone. A few loops of fluid-filled small bowel which may represent some enteritis. No obstructive changes are seen. Electronically Signed   By: Oneil Devonshire M.D.   On: 01/27/2024 00:59   (Echo, Carotid, EGD, Colonoscopy, ERCP)    Subjective: Follow-up in his diet no abdominal pain.  Discharge Exam: Vitals:   02/14/24 0504 02/14/24 0756  BP: 134/77 139/80  Pulse: 73 62  Resp: 18 20  Temp: 98.5 F (36.9 C) 99.4 F (37.4 C)  SpO2: 91% 90%   Vitals:   02/13/24 2020 02/13/24 2328 02/14/24 0504 02/14/24 0756  BP: 113/66 130/76 134/77 139/80  Pulse: 74 64 73 62  Resp: 18 18 18 20   Temp: 99.5 F (37.5 C) 98 F (36.7 C) 98.5 F (36.9 C) 99.4 F (37.4 C)  TempSrc: Oral Oral Oral Oral  SpO2: 91% 91% 91% 90%  Weight:      Height:        General: Pt is alert, awake, not in acute distress Cardiovascular: RRR, S1/S2 +, no rubs, no gallops Respiratory: CTA bilaterally, no wheezing, no rhonchi Abdominal: Soft, NT, ND, bowel sounds + Extremities: no edema, no cyanosis    The results of significant diagnostics from this hospitalization (including imaging, microbiology, ancillary and laboratory) are listed below for reference.     Microbiology: Recent Results (from the past 240 hours)  Resp panel by RT-PCR (RSV, Flu A&B, Covid) Anterior  Nasal Swab     Status: Abnormal   Collection Time: 02/07/24  8:07 PM   Specimen: Anterior Nasal Swab  Result Value Ref Range Status   SARS Coronavirus 2 by RT PCR NEGATIVE NEGATIVE Final    Comment: (NOTE) SARS-CoV-2 target nucleic acids are NOT DETECTED.  The SARS-CoV-2 RNA is generally detectable in upper respiratory specimens during the acute phase of infection. The lowest concentration of SARS-CoV-2 viral copies this assay can detect is 138 copies/mL. A negative result does not preclude SARS-Cov-2 infection and should not be used as the sole basis for treatment or other patient management decisions. A negative result may occur with  improper specimen collection/handling, submission of specimen other than nasopharyngeal swab, presence of viral mutation(s) within the areas targeted by this assay, and inadequate number of viral copies(<138 copies/mL). A negative result must be combined with clinical observations, patient history, and epidemiological information. The expected result is Negative.  Fact Sheet for Patients:  bloggercourse.com  Fact Sheet for Healthcare Providers:  seriousbroker.it  This test is no t yet approved or cleared by the [place]  FDA and  has been authorized for detection and/or diagnosis of SARS-CoV-2 by FDA under an Emergency Use Authorization (EUA). This EUA will remain  in effect (meaning this test can be used) for the duration of the COVID-19 declaration under Section 564(b)(1) of the Act, 21 U.S.C.section 360bbb-3(b)(1), unless the authorization is terminated  or revoked sooner.       Influenza A by PCR NEGATIVE NEGATIVE Final   Influenza B by PCR NEGATIVE NEGATIVE Final    Comment: (NOTE) The Xpert Xpress SARS-CoV-2/FLU/RSV plus  assay is intended as an aid in the diagnosis of influenza from Nasopharyngeal swab specimens and should not be used as a sole basis for treatment. Nasal washings  and aspirates are unacceptable for Xpert Xpress SARS-CoV-2/FLU/RSV testing.  Fact Sheet for Patients: bloggercourse.com  Fact Sheet for Healthcare Providers: seriousbroker.it  This test is not yet approved or cleared by the [place]  FDA and has been authorized for detection and/or diagnosis of SARS-CoV-2 by FDA under an Emergency Use Authorization (EUA). This EUA will remain in effect (meaning this test can be used) for the duration of the COVID-19 declaration under Section 564(b)(1) of the Act, 21 U.S.C. section 360bbb-3(b)(1), unless the authorization is terminated or revoked.     Resp Syncytial Virus by PCR POSITIVE (A) NEGATIVE Final    Comment: (NOTE) Fact Sheet for Patients: bloggercourse.com  Fact Sheet for Healthcare Providers: seriousbroker.it  This test is not yet approved or cleared by the [place]  FDA and has been authorized for detection and/or diagnosis of SARS-CoV-2 by FDA under an Emergency Use Authorization (EUA). This EUA will remain in effect (meaning this test can be used) for the duration of the COVID-19 declaration under Section 564(b)(1) of the Act, 21 U.S.C. section 360bbb-3(b)(1), unless the authorization is terminated or revoked.  Performed at [facility], [address] [place], [place] [address]   MRSA Next Gen by PCR, Nasal     Status: None   Collection Time: 02/07/24 10:09 PM   Specimen: Nasal Mucosa; Nasal Swab  Result Value Ref Range Status   MRSA by PCR Next Gen NOT DETECTED NOT DETECTED Final    Comment: (NOTE) The GeneXpert MRSA Assay (FDA approved for NASAL specimens only), is one component of a comprehensive MRSA colonization surveillance program. It is not intended to diagnose MRSA infection nor to guide or monitor treatment for MRSA infections. Test performance is not FDA approved in patients less than 70  years old. Performed at [facility], [address], [place], [place] [address]      Labs: BNP (last 3 results) No results for input(s): BNP in the last 8760 hours. Basic Metabolic Panel: Recent Labs  Lab 02/07/24 1948 02/07/24 2309 02/08/24 0316 02/09/24 0439 02/10/24 0405  NA 139  --  136 132* 131*  K 4.3  --  4.1 3.9 4.0  CL 99  --  101 100 102  CO2 23  --  21* 21* 20*  GLUCOSE 135*  --  148* 92 90  BUN 28*  --  35* 45* 35*  CREATININE 1.40* 1.48* 1.55* 1.36* 1.35*  CALCIUM  10.2  --  9.1 8.7* 8.5*  MG 2.2  --   --   --   --    Liver Function Tests: Recent Labs  Lab 02/07/24 1948  AST 22  ALT 12  ALKPHOS 121  BILITOT 0.3  PROT 8.8*  ALBUMIN 4.2   No results for input(s): LIPASE, AMYLASE in the last 168 hours. No results for input(s): AMMONIA in the last 168 hours. CBC: Recent Labs  Lab 02/07/24 1948 02/07/24 2309 02/10/24 0950 02/10/24 2007 02/11/24 0823 02/11/24 2037 02/12/24 0754  WBC 11.7*   < > 12.4* 13.8* 13.2* 11.7* 11.2*  NEUTROABS 5.9  --   --   --   --   --   --   HGB 17.2*   < > 14.4 14.6 14.5 12.9 13.3  HCT 49.7*   < > 42.8 42.4 43.0 38.2 39.5  MCV 87.2   < >  87.7 86.4 87.4 88.8 88.4  PLT 460*   < > 369 373 344 318 332   < > = values in this interval not displayed.   Cardiac Enzymes: No results for input(s): CKTOTAL, CKMB, CKMBINDEX, TROPONINI in the last 168 hours. BNP: Invalid input(s): POCBNP CBG: No results for input(s): GLUCAP in the last 168 hours. D-Dimer No results for input(s): DDIMER in the last 72 hours. Hgb A1c No results for input(s): HGBA1C in the last 72 hours. Lipid Profile No results for input(s): CHOL, HDL, LDLCALC, TRIG, CHOLHDL, LDLDIRECT in the last 72 hours. Thyroid  function studies No results for input(s): TSH, T4TOTAL, T3FREE, THYROIDAB in the last 72 hours.  Invalid input(s): FREET3 Anemia work up No results for input(s): VITAMINB12, FOLATE,  FERRITIN, TIBC, IRON, RETICCTPCT in the last 72 hours. Urinalysis    Component Value Date/Time   COLORURINE YELLOW 01/26/2024 2302   APPEARANCEUR CLEAR 01/26/2024 2302   APPEARANCEUR Clear 06/17/2020 1506   LABSPEC 1.011 01/26/2024 2302   LABSPEC 1.008 01/27/2013 2352   PHURINE 5.0 01/26/2024 2302   GLUCOSEU NEGATIVE 01/26/2024 2302   GLUCOSEU Negative 01/27/2013 2352   HGBUR NEGATIVE 01/26/2024 2302   BILIRUBINUR NEGATIVE 01/26/2024 2302   BILIRUBINUR Negative 10/21/2023 1009   BILIRUBINUR Negative 06/17/2020 1506   BILIRUBINUR Negative 01/27/2013 2352   KETONESUR NEGATIVE 01/26/2024 2302   PROTEINUR NEGATIVE 01/26/2024 2302   UROBILINOGEN 0.2 10/21/2023 1009   NITRITE NEGATIVE 01/26/2024 2302   LEUKOCYTESUR LARGE (A) 01/26/2024 2302   LEUKOCYTESUR Negative 01/27/2013 2352   Sepsis Labs Recent Labs  Lab 02/10/24 2007 02/11/24 0823 02/11/24 2037 02/12/24 0754  WBC 13.8* 13.2* 11.7* 11.2*   Microbiology Recent Results (from the past 240 hours)  Resp panel by RT-PCR (RSV, Flu A&B, Covid) Anterior Nasal Swab     Status: Abnormal   Collection Time: 02/07/24  8:07 PM   Specimen: Anterior Nasal Swab  Result Value Ref Range Status   SARS Coronavirus 2 by RT PCR NEGATIVE NEGATIVE Final    Comment: (NOTE) SARS-CoV-2 target nucleic acids are NOT DETECTED.  The SARS-CoV-2 RNA is generally detectable in upper respiratory specimens during the acute phase of infection. The lowest concentration of SARS-CoV-2 viral copies this assay can detect is 138 copies/mL. A negative result does not preclude SARS-Cov-2 infection and should not be used as the sole basis for treatment or other patient management decisions. A negative result may occur with  improper specimen collection/handling, submission of specimen other than nasopharyngeal swab, presence of viral mutation(s) within the areas targeted by this assay, and inadequate number of viral copies(<138 copies/mL). A negative  result must be combined with clinical observations, patient history, and epidemiological information. The expected result is Negative.  Fact Sheet for Patients:  bloggercourse.com  Fact Sheet for Healthcare Providers:  seriousbroker.it  This test is no t yet approved or cleared by the [place]  FDA and  has been authorized for detection and/or diagnosis of SARS-CoV-2 by FDA under an Emergency Use Authorization (EUA). This EUA will remain  in effect (meaning this test can be used) for the duration of the COVID-19 declaration under Section 564(b)(1) of the Act, 21 U.S.C.section 360bbb-3(b)(1), unless the authorization is terminated  or revoked sooner.       Influenza A by PCR NEGATIVE NEGATIVE Final   Influenza B by PCR NEGATIVE NEGATIVE Final    Comment: (NOTE) The Xpert Xpress SARS-CoV-2/FLU/RSV plus assay is intended as an aid in the diagnosis of influenza from Nasopharyngeal swab specimens and should  not be used as a sole basis for treatment. Nasal washings and aspirates are unacceptable for Xpert Xpress SARS-CoV-2/FLU/RSV testing.  Fact Sheet for Patients: bloggercourse.com  Fact Sheet for Healthcare Providers: seriousbroker.it  This test is not yet approved or cleared by the [place]  FDA and has been authorized for detection and/or diagnosis of SARS-CoV-2 by FDA under an Emergency Use Authorization (EUA). This EUA will remain in effect (meaning this test can be used) for the duration of the COVID-19 declaration under Section 564(b)(1) of the Act, 21 U.S.C. section 360bbb-3(b)(1), unless the authorization is terminated or revoked.     Resp Syncytial Virus by PCR POSITIVE (A) NEGATIVE Final    Comment: (NOTE) Fact Sheet for Patients: bloggercourse.com  Fact Sheet for Healthcare  Providers: seriousbroker.it  This test is not yet approved or cleared by the [place]  FDA and has been authorized for detection and/or diagnosis of SARS-CoV-2 by FDA under an Emergency Use Authorization (EUA). This EUA will remain in effect (meaning this test can be used) for the duration of the COVID-19 declaration under Section 564(b)(1) of the Act, 21 U.S.C. section 360bbb-3(b)(1), unless the authorization is terminated or revoked.  Performed at [facility], [address], [place], [place] [address]   MRSA Next Gen by PCR, Nasal     Status: None   Collection Time: 02/07/24 10:09 PM   Specimen: Nasal Mucosa; Nasal Swab  Result Value Ref Range Status   MRSA by PCR Next Gen NOT DETECTED NOT DETECTED Final    Comment: (NOTE) The GeneXpert MRSA Assay (FDA approved for NASAL specimens only), is one component of a comprehensive MRSA colonization surveillance program. It is not intended to diagnose MRSA infection nor to guide or monitor treatment for MRSA infections. Test performance is not FDA approved in patients less than 48 years old. Performed at [facility], [address], [place], [place] [address]      Time coordinating discharge: Over 30 minutes  SIGNED:   Erle Odell Castor, MD  [facility] 02/14/2024, 8:44 AM Pager   If 7PM-7AM, please contact night-coverage [email or web address] Password [email or web address]

## 2024-02-15 ENCOUNTER — Encounter (HOSPITAL_COMMUNITY): Payer: Self-pay | Admitting: Gastroenterology

## 2024-02-18 ENCOUNTER — Ambulatory Visit: Payer: Self-pay | Admitting: Gastroenterology

## 2024-02-21 ENCOUNTER — Ambulatory Visit: Admitting: Internal Medicine

## 2024-03-29 ENCOUNTER — Emergency Department (HOSPITAL_COMMUNITY)

## 2024-03-29 ENCOUNTER — Encounter (HOSPITAL_COMMUNITY): Payer: Self-pay | Admitting: Internal Medicine

## 2024-03-29 ENCOUNTER — Other Ambulatory Visit: Payer: Self-pay

## 2024-03-29 ENCOUNTER — Encounter (HOSPITAL_COMMUNITY): Payer: Self-pay | Admitting: Cardiovascular Disease

## 2024-03-29 ENCOUNTER — Inpatient Hospital Stay (HOSPITAL_COMMUNITY)
Admission: EM | Admit: 2024-03-29 | Discharge: 2024-04-03 | DRG: 300 | Disposition: A | Discharge status: Home / Self Care | Attending: Family Medicine | Admitting: Family Medicine

## 2024-03-29 ENCOUNTER — Emergency Department (HOSPITAL_COMMUNITY): Admit: 2024-03-29 | Admitting: Cardiovascular Disease

## 2024-03-29 ENCOUNTER — Encounter (HOSPITAL_COMMUNITY)
Admission: EM | Disposition: A | Discharge status: Home / Self Care | Payer: Self-pay | Source: Home / Self Care | Attending: Hospitalist

## 2024-03-29 DIAGNOSIS — R079 Chest pain, unspecified: Secondary | ICD-10-CM | POA: Diagnosis not present

## 2024-03-29 DIAGNOSIS — E875 Hyperkalemia: Secondary | ICD-10-CM | POA: Diagnosis present

## 2024-03-29 DIAGNOSIS — Z91411 Personal history of adult psychological abuse: Secondary | ICD-10-CM

## 2024-03-29 DIAGNOSIS — R1084 Generalized abdominal pain: Secondary | ICD-10-CM | POA: Diagnosis not present

## 2024-03-29 DIAGNOSIS — Z1152 Encounter for screening for COVID-19: Secondary | ICD-10-CM | POA: Diagnosis not present

## 2024-03-29 DIAGNOSIS — R112 Nausea with vomiting, unspecified: Secondary | ICD-10-CM | POA: Diagnosis not present

## 2024-03-29 DIAGNOSIS — I700 Atherosclerosis of aorta: Secondary | ICD-10-CM | POA: Diagnosis present

## 2024-03-29 DIAGNOSIS — I252 Old myocardial infarction: Secondary | ICD-10-CM

## 2024-03-29 DIAGNOSIS — R109 Unspecified abdominal pain: Secondary | ICD-10-CM | POA: Diagnosis not present

## 2024-03-29 DIAGNOSIS — K5900 Constipation, unspecified: Secondary | ICD-10-CM | POA: Diagnosis present

## 2024-03-29 DIAGNOSIS — R1013 Epigastric pain: Secondary | ICD-10-CM | POA: Diagnosis present

## 2024-03-29 DIAGNOSIS — I130 Hypertensive heart and chronic kidney disease with heart failure and stage 1 through stage 4 chronic kidney disease, or unspecified chronic kidney disease: Secondary | ICD-10-CM | POA: Diagnosis present

## 2024-03-29 DIAGNOSIS — E785 Hyperlipidemia, unspecified: Secondary | ICD-10-CM | POA: Diagnosis present

## 2024-03-29 DIAGNOSIS — F4481 Dissociative identity disorder: Secondary | ICD-10-CM | POA: Diagnosis present

## 2024-03-29 DIAGNOSIS — N1832 Chronic kidney disease, stage 3b: Secondary | ICD-10-CM | POA: Diagnosis present

## 2024-03-29 DIAGNOSIS — I340 Nonrheumatic mitral (valve) insufficiency: Secondary | ICD-10-CM | POA: Diagnosis present

## 2024-03-29 DIAGNOSIS — R0789 Other chest pain: Secondary | ICD-10-CM | POA: Diagnosis present

## 2024-03-29 DIAGNOSIS — I25118 Atherosclerotic heart disease of native coronary artery with other forms of angina pectoris: Secondary | ICD-10-CM | POA: Diagnosis present

## 2024-03-29 DIAGNOSIS — Z79899 Other long term (current) drug therapy: Secondary | ICD-10-CM

## 2024-03-29 DIAGNOSIS — I771 Stricture of artery: Principal | ICD-10-CM | POA: Diagnosis present

## 2024-03-29 DIAGNOSIS — K625 Hemorrhage of anus and rectum: Secondary | ICD-10-CM | POA: Diagnosis present

## 2024-03-29 DIAGNOSIS — I5032 Chronic diastolic (congestive) heart failure: Secondary | ICD-10-CM | POA: Diagnosis present

## 2024-03-29 DIAGNOSIS — K644 Residual hemorrhoidal skin tags: Secondary | ICD-10-CM | POA: Diagnosis present

## 2024-03-29 DIAGNOSIS — R9389 Abnormal findings on diagnostic imaging of other specified body structures: Secondary | ICD-10-CM | POA: Diagnosis not present

## 2024-03-29 DIAGNOSIS — D72829 Elevated white blood cell count, unspecified: Secondary | ICD-10-CM | POA: Diagnosis present

## 2024-03-29 DIAGNOSIS — Z7982 Long term (current) use of aspirin: Secondary | ICD-10-CM

## 2024-03-29 DIAGNOSIS — Z955 Presence of coronary angioplasty implant and graft: Secondary | ICD-10-CM

## 2024-03-29 DIAGNOSIS — F209 Schizophrenia, unspecified: Secondary | ICD-10-CM | POA: Diagnosis present

## 2024-03-29 DIAGNOSIS — F17210 Nicotine dependence, cigarettes, uncomplicated: Secondary | ICD-10-CM | POA: Diagnosis present

## 2024-03-29 DIAGNOSIS — J449 Chronic obstructive pulmonary disease, unspecified: Secondary | ICD-10-CM | POA: Diagnosis present

## 2024-03-29 DIAGNOSIS — F319 Bipolar disorder, unspecified: Secondary | ICD-10-CM | POA: Diagnosis present

## 2024-03-29 DIAGNOSIS — I10 Essential (primary) hypertension: Secondary | ICD-10-CM

## 2024-03-29 DIAGNOSIS — Z888 Allergy status to other drugs, medicaments and biological substances status: Secondary | ICD-10-CM

## 2024-03-29 DIAGNOSIS — K219 Gastro-esophageal reflux disease without esophagitis: Secondary | ICD-10-CM | POA: Diagnosis present

## 2024-03-29 DIAGNOSIS — K838 Other specified diseases of biliary tract: Secondary | ICD-10-CM | POA: Diagnosis present

## 2024-03-29 DIAGNOSIS — I2489 Other forms of acute ischemic heart disease: Secondary | ICD-10-CM | POA: Diagnosis present

## 2024-03-29 DIAGNOSIS — F419 Anxiety disorder, unspecified: Secondary | ICD-10-CM | POA: Diagnosis present

## 2024-03-29 DIAGNOSIS — E871 Hypo-osmolality and hyponatremia: Secondary | ICD-10-CM | POA: Diagnosis present

## 2024-03-29 DIAGNOSIS — Z8249 Family history of ischemic heart disease and other diseases of the circulatory system: Secondary | ICD-10-CM

## 2024-03-29 DIAGNOSIS — Z7151 Drug abuse counseling and surveillance of drug abuser: Secondary | ICD-10-CM

## 2024-03-29 DIAGNOSIS — C801 Malignant (primary) neoplasm, unspecified: Secondary | ICD-10-CM

## 2024-03-29 DIAGNOSIS — D75839 Thrombocytosis, unspecified: Secondary | ICD-10-CM | POA: Diagnosis present

## 2024-03-29 DIAGNOSIS — Z8544 Personal history of malignant neoplasm of other female genital organs: Secondary | ICD-10-CM

## 2024-03-29 DIAGNOSIS — F1210 Cannabis abuse, uncomplicated: Secondary | ICD-10-CM | POA: Diagnosis present

## 2024-03-29 DIAGNOSIS — G8929 Other chronic pain: Secondary | ICD-10-CM | POA: Diagnosis present

## 2024-03-29 DIAGNOSIS — Z59868 Other specified financial insecurity: Secondary | ICD-10-CM

## 2024-03-29 LAB — CBC WITH DIFFERENTIAL/PLATELET
Abs Immature Granulocytes: 0.04 K/uL (ref 0.00–0.07)
Basophils Absolute: 0 K/uL (ref 0.0–0.1)
Basophils Relative: 0 %
Eosinophils Absolute: 0 K/uL (ref 0.0–0.5)
Eosinophils Relative: 0 %
HCT: 49.1 % — ABNORMAL HIGH (ref 36.0–46.0)
Hemoglobin: 16.5 g/dL — ABNORMAL HIGH (ref 12.0–15.0)
Immature Granulocytes: 0 %
Lymphocytes Relative: 30 %
Lymphs Abs: 4.3 K/uL — ABNORMAL HIGH (ref 0.7–4.0)
MCH: 30.3 pg (ref 26.0–34.0)
MCHC: 33.6 g/dL (ref 30.0–36.0)
MCV: 90.1 fL (ref 80.0–100.0)
Monocytes Absolute: 0.9 K/uL (ref 0.1–1.0)
Monocytes Relative: 6 %
Neutro Abs: 9.3 K/uL — ABNORMAL HIGH (ref 1.7–7.7)
Neutrophils Relative %: 64 %
Platelets: 450 K/uL — ABNORMAL HIGH (ref 150–400)
RBC: 5.45 MIL/uL — ABNORMAL HIGH (ref 3.87–5.11)
RDW: 14.5 % (ref 11.5–15.5)
WBC: 14.5 K/uL — ABNORMAL HIGH (ref 4.0–10.5)
nRBC: 0 % (ref 0.0–0.2)

## 2024-03-29 LAB — URINALYSIS, ROUTINE W REFLEX MICROSCOPIC
Bacteria, UA: NONE SEEN
Bilirubin Urine: NEGATIVE
Glucose, UA: NEGATIVE mg/dL
Ketones, ur: NEGATIVE mg/dL
Leukocytes,Ua: NEGATIVE
Nitrite: NEGATIVE
Protein, ur: 100 mg/dL — AB
Specific Gravity, Urine: 1.041 — ABNORMAL HIGH (ref 1.005–1.030)
pH: 5 (ref 5.0–8.0)

## 2024-03-29 LAB — COMPREHENSIVE METABOLIC PANEL WITH GFR
ALT: 16 U/L (ref 0–44)
AST: 46 U/L — ABNORMAL HIGH (ref 15–41)
Albumin: 4.1 g/dL (ref 3.5–5.0)
Alkaline Phosphatase: 101 U/L (ref 38–126)
Anion gap: 14 (ref 5–15)
BUN: 38 mg/dL — ABNORMAL HIGH (ref 6–20)
CO2: 17 mmol/L — ABNORMAL LOW (ref 22–32)
Calcium: 9.9 mg/dL (ref 8.9–10.3)
Chloride: 104 mmol/L (ref 98–111)
Creatinine, Ser: 1.55 mg/dL — ABNORMAL HIGH (ref 0.44–1.00)
GFR, Estimated: 38 mL/min — ABNORMAL LOW
Glucose, Bld: 82 mg/dL (ref 70–99)
Potassium: 6.5 mmol/L (ref 3.5–5.1)
Sodium: 135 mmol/L (ref 135–145)
Total Bilirubin: 0.4 mg/dL (ref 0.0–1.2)
Total Protein: 7.9 g/dL (ref 6.5–8.1)

## 2024-03-29 LAB — URINE DRUG SCREEN
Amphetamines: NEGATIVE
Barbiturates: NEGATIVE
Benzodiazepines: NEGATIVE
Cocaine: NEGATIVE
Fentanyl: POSITIVE — AB
Methadone Scn, Ur: NEGATIVE
Opiates: NEGATIVE
Tetrahydrocannabinol: POSITIVE — AB

## 2024-03-29 LAB — MAGNESIUM: Magnesium: 2.7 mg/dL — ABNORMAL HIGH (ref 1.7–2.4)

## 2024-03-29 LAB — I-STAT CHEM 8, ED
BUN: 46 mg/dL — ABNORMAL HIGH (ref 6–20)
Calcium, Ion: 1.11 mmol/L — ABNORMAL LOW (ref 1.15–1.40)
Chloride: 106 mmol/L (ref 98–111)
Creatinine, Ser: 1.8 mg/dL — ABNORMAL HIGH (ref 0.44–1.00)
Glucose, Bld: 122 mg/dL — ABNORMAL HIGH (ref 70–99)
HCT: 49 % — ABNORMAL HIGH (ref 36.0–46.0)
Hemoglobin: 16.7 g/dL — ABNORMAL HIGH (ref 12.0–15.0)
Potassium: 5.2 mmol/L — ABNORMAL HIGH (ref 3.5–5.1)
Sodium: 136 mmol/L (ref 135–145)
TCO2: 22 mmol/L (ref 22–32)

## 2024-03-29 LAB — RESP PANEL BY RT-PCR (RSV, FLU A&B, COVID)  RVPGX2
Influenza A by PCR: NEGATIVE
Influenza B by PCR: NEGATIVE
Resp Syncytial Virus by PCR: NEGATIVE
SARS Coronavirus 2 by RT PCR: NEGATIVE

## 2024-03-29 LAB — LIPASE, BLOOD: Lipase: 43 U/L (ref 11–51)

## 2024-03-29 LAB — POC OCCULT BLOOD, ED: Fecal Occult Bld: NEGATIVE

## 2024-03-29 LAB — TROPONIN T, HIGH SENSITIVITY
Troponin T High Sensitivity: 118 ng/L (ref 0–19)
Troponin T High Sensitivity: 136 ng/L (ref 0–19)

## 2024-03-29 LAB — PHOSPHORUS: Phosphorus: 3.9 mg/dL (ref 2.5–4.6)

## 2024-03-29 LAB — PRO BRAIN NATRIURETIC PEPTIDE: Pro Brain Natriuretic Peptide: >35000 pg/mL — ABNORMAL HIGH

## 2024-03-29 LAB — ETHANOL: Alcohol, Ethyl (B): <15 mg/dL

## 2024-03-29 SURGERY — CORONARY/GRAFT ACUTE MI REVASCULARIZATION
Anesthesia: Moderate Sedation

## 2024-03-29 MED ORDER — DIPHENHYDRAMINE HCL 50 MG/ML IJ SOLN
12.5000 mg | Freq: Once | INTRAMUSCULAR | Status: AC
  Administered 2024-03-29: 12.5 mg via INTRAVENOUS
  Filled 2024-03-29: qty 1

## 2024-03-29 MED ORDER — HYDROMORPHONE HCL 1 MG/ML IJ SOLN
1.0000 mg | INTRAMUSCULAR | Status: DC | PRN
  Administered 2024-03-29 – 2024-03-31 (×8): 1 mg via INTRAVENOUS
  Filled 2024-03-29 (×8): qty 1

## 2024-03-29 MED ORDER — AMLODIPINE BESYLATE 5 MG PO TABS
10.0000 mg | ORAL_TABLET | Freq: Every day | ORAL | Status: DC
  Administered 2024-03-29 – 2024-04-03 (×6): 10 mg via ORAL
  Filled 2024-03-29 (×4): qty 2

## 2024-03-29 MED ORDER — HYDRALAZINE HCL 20 MG/ML IJ SOLN
10.0000 mg | Freq: Four times a day (QID) | INTRAMUSCULAR | Status: DC | PRN
  Administered 2024-04-01 – 2024-04-02 (×4): 10 mg via INTRAVENOUS
  Filled 2024-03-29 (×2): qty 1

## 2024-03-29 MED ORDER — HYDROMORPHONE HCL 1 MG/ML IJ SOLN
1.0000 mg | Freq: Once | INTRAMUSCULAR | Status: AC
  Administered 2024-03-29: 1 mg via INTRAVENOUS
  Filled 2024-03-29: qty 1

## 2024-03-29 MED ORDER — SODIUM ZIRCONIUM CYCLOSILICATE 10 G PO PACK
10.0000 g | PACK | Freq: Once | ORAL | Status: AC
  Administered 2024-03-29: 10 g via ORAL
  Filled 2024-03-29: qty 1

## 2024-03-29 MED ORDER — PROCHLORPERAZINE EDISYLATE 10 MG/2ML IJ SOLN
10.0000 mg | Freq: Once | INTRAMUSCULAR | Status: AC
  Administered 2024-03-29: 10 mg via INTRAVENOUS
  Filled 2024-03-29: qty 2

## 2024-03-29 MED ORDER — SODIUM CHLORIDE 0.9 % IV BOLUS
1000.0000 mL | Freq: Once | INTRAVENOUS | Status: AC
  Administered 2024-03-29: 1000 mL via INTRAVENOUS

## 2024-03-29 MED ORDER — GABAPENTIN 300 MG PO CAPS
400.0000 mg | ORAL_CAPSULE | Freq: Three times a day (TID) | ORAL | Status: DC
  Administered 2024-03-29 – 2024-04-03 (×14): 400 mg via ORAL
  Filled 2024-03-29 (×10): qty 1

## 2024-03-29 MED ORDER — SUCRALFATE 1 GM/10ML PO SUSP
1.0000 g | Freq: Three times a day (TID) | ORAL | Status: DC
  Administered 2024-03-29 – 2024-04-03 (×17): 1 g via ORAL
  Filled 2024-03-29 (×12): qty 10

## 2024-03-29 MED ORDER — ORAL CARE MOUTH RINSE: 15.0000 mL | OROMUCOSAL | Status: DC | PRN

## 2024-03-29 MED ORDER — CARVEDILOL 3.125 MG PO TABS
3.1250 mg | ORAL_TABLET | Freq: Two times a day (BID) | ORAL | Status: DC
  Administered 2024-03-30 – 2024-04-03 (×8): 3.125 mg via ORAL
  Filled 2024-03-29 (×6): qty 1

## 2024-03-29 MED ORDER — ONDANSETRON HCL 4 MG/2ML IJ SOLN
4.0000 mg | Freq: Four times a day (QID) | INTRAMUSCULAR | Status: DC | PRN
  Administered 2024-03-29 – 2024-04-03 (×15): 4 mg via INTRAVENOUS
  Filled 2024-03-29 (×10): qty 2

## 2024-03-29 MED ORDER — INSULIN ASPART 100 UNIT/ML IJ SOLN
5.0000 [IU] | Freq: Once | INTRAMUSCULAR | Status: AC
  Administered 2024-03-29: 5 [IU] via INTRAVENOUS
  Filled 2024-03-29: qty 5

## 2024-03-29 MED ORDER — IOHEXOL 350 MG/ML SOLN
70.0000 mL | Freq: Once | INTRAVENOUS | Status: AC | PRN
  Administered 2024-03-29: 70 mL via INTRAVENOUS

## 2024-03-29 MED ORDER — ACETAMINOPHEN 325 MG PO TABS
650.0000 mg | ORAL_TABLET | Freq: Four times a day (QID) | ORAL | Status: DC | PRN
  Administered 2024-03-29 – 2024-04-02 (×4): 650 mg via ORAL
  Filled 2024-03-29 (×4): qty 2

## 2024-03-29 MED ORDER — ACETAMINOPHEN 650 MG RE SUPP: 650.0000 mg | Freq: Four times a day (QID) | RECTAL | Status: DC | PRN

## 2024-03-29 MED ORDER — ONDANSETRON HCL 4 MG PO TABS: 4.0000 mg | ORAL_TABLET | Freq: Four times a day (QID) | ORAL | Status: DC | PRN

## 2024-03-29 MED ORDER — ROSUVASTATIN CALCIUM 20 MG PO TABS
20.0000 mg | ORAL_TABLET | Freq: Every day | ORAL | Status: DC
  Administered 2024-03-30 – 2024-04-03 (×5): 20 mg via ORAL
  Filled 2024-03-29 (×3): qty 1

## 2024-03-29 MED ORDER — PANTOPRAZOLE SODIUM 40 MG PO TBEC
40.0000 mg | DELAYED_RELEASE_TABLET | Freq: Two times a day (BID) | ORAL | Status: DC
  Administered 2024-03-29 – 2024-03-30 (×2): 40 mg via ORAL
  Filled 2024-03-29 (×2): qty 1

## 2024-03-29 MED ORDER — PANTOPRAZOLE SODIUM 40 MG IV SOLR
40.0000 mg | Freq: Once | INTRAVENOUS | Status: AC
  Administered 2024-03-29: 40 mg via INTRAVENOUS
  Filled 2024-03-29: qty 10

## 2024-03-29 MED ORDER — LOSARTAN POTASSIUM 50 MG PO TABS: 100.0000 mg | ORAL_TABLET | Freq: Every day | ORAL | Status: DC

## 2024-03-29 MED ORDER — DEXTROSE 50 % IV SOLN
1.0000 | Freq: Once | INTRAVENOUS | Status: AC
  Administered 2024-03-29: 50 mL via INTRAVENOUS
  Filled 2024-03-29: qty 50

## 2024-03-29 MED ORDER — OXYCODONE HCL 5 MG PO TABS
5.0000 mg | ORAL_TABLET | ORAL | Status: DC | PRN
  Administered 2024-03-29 – 2024-04-03 (×18): 5 mg via ORAL
  Filled 2024-03-29 (×13): qty 1

## 2024-03-29 MED ORDER — NITROGLYCERIN 0.4 MG SL SUBL: 0.4000 mg | SUBLINGUAL_TABLET | SUBLINGUAL | Status: DC | PRN

## 2024-03-29 MED ORDER — CALCIUM GLUCONATE-NACL 1-0.675 GM/50ML-% IV SOLN
1.0000 g | Freq: Once | INTRAVENOUS | Status: AC
  Administered 2024-03-29: 1000 mg via INTRAVENOUS
  Filled 2024-03-29: qty 50

## 2024-03-29 NOTE — ED Triage Notes (Signed)
 Pt BIB [facility] fromn home due to chest pain. Hx MI.  Pt was given 2 nitroglycerin  and 100 fentanyl . Pt does report coughing and fevers the past couple days.
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
## 2024-03-29 NOTE — ED Notes (Signed)
Shift report received, assumed care of patient at this time 
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
## 2024-03-29 NOTE — Progress Notes (Signed)
 Patient on floor and requesting to be able to drink and eat. No procedures planned, no consults pending. H&P does not address NPO status specifically. Will page Dr. Cleatus with request.

## 2024-03-29 NOTE — ED Provider Notes (Signed)
 " [facility] Provider Note   CSN: [id number] Arrival date & time: 03/29/24  1334     Patient presents with: Abdominal Pain and Chest Pain   Carol Henderson is a 58 y.o. female.   Patient here with upper abdominal pain chest pain nausea vomiting.  She has been having cough and fever for couple days possibly as well.  She thought she noticed some blood in her stool but denies any diarrhea.  History of CAD.  History of schizophrenia bipolar chronic hep C vulvar cancer COPD hypertension depression.  Nothing makes it worse or better.  She was code STEMI in the field but canceled upon arrival here after being seen by Dr. Verlin.  Has had history of THC and crack cocaine use in the past.  Nothing makes it worse or better.  Symptoms for the last few days worsening today.  The history is provided by the patient.       Prior to Admission medications  Medication Sig Start Date End Date Taking? Authorizing Provider  albuterol  (VENTOLIN  HFA) 108 (90 Base) MCG/ACT inhaler Inhale 2 puffs into the lungs every 6 (six) hours as needed for wheezing or shortness of breath. 01/10/24   [name], MD  amLODipine  (NORVASC ) 10 MG tablet Take 1 tablet (10 mg total) by mouth daily. 10/21/23   [name], MD  carvedilol  (COREG ) 3.125 MG tablet Take 1 tablet (3.125 mg total) by mouth 2 (two) times daily with a meal. 10/21/23   [name], MD  gabapentin  (NEURONTIN ) 400 MG capsule Take 1 capsule (400 mg total) by mouth 3 (three) times daily. Reported on 07/02/2015 10/21/23   [name], MD  losartan  (COZAAR ) 100 MG tablet Take 1 tablet (100 mg total) by mouth daily. 10/21/23   [name], MD  methocarbamol  (ROBAXIN ) 500 MG tablet Take 1 tablet (500 mg total) by mouth 2 (two) times daily. 01/27/24   [name], MD  nitroGLYCERIN  (NITROSTAT ) 0.4 MG SL tablet Place 1 tablet (0.4 mg total) under the tongue every 5 (five) minutes as needed for chest  pain. 10/21/23   [name], MD  ondansetron  (ZOFRAN  ODT) 4 MG disintegrating tablet Take 1 tablet (4 mg total) by mouth every 8 (eight) hours as needed for nausea or vomiting. 10/21/23   [name], MD  pantoprazole  (PROTONIX ) 40 MG tablet Take 1 tablet (40 mg total) by mouth 2 (two) times daily. 02/14/24   Odell Celinda Balo, MD  rosuvastatin  (CRESTOR ) 20 MG tablet Take 1 tablet (20 mg total) by mouth daily. 02/15/24   Odell Celinda Balo, MD  sucralfate  (CARAFATE ) 1 GM/10ML suspension Take 10 mLs (1 g total) by mouth 4 (four) times daily -  before meals and at bedtime. 02/14/24   Odell Celinda Balo, MD  Tiotropium Bromide  (SPIRIVA  RESPIMAT) 2.5 MCG/ACT AERS Inhale 2 puffs into the lungs daily. 02/06/24 05/06/24  [name], MD    Allergies: Ativan  [lorazepam ] and Amitriptyline     Review of Systems  Updated Vital Signs BP (!) 153/119 (BP Location: Right Arm)   Pulse (!) 109   Temp 98 F (36.7 C) (Oral)   Resp 20   SpO2 91%   Physical Exam Vitals and nursing note reviewed.  Constitutional:      General: She is not in acute distress.    Appearance: She is well-developed. She is ill-appearing.  HENT:     Head: Normocephalic and atraumatic.  Mouth/Throat:     Mouth: Mucous membranes are moist.  Eyes:     Extraocular Movements: Extraocular movements intact.     Conjunctiva/sclera: Conjunctivae normal.     Pupils: Pupils are equal, round, and reactive to light.  Cardiovascular:     Rate and Rhythm: Normal rate and regular rhythm.     Heart sounds: Normal heart sounds. No murmur heard. Pulmonary:     Effort: Pulmonary effort is normal. No respiratory distress.     Breath sounds: Normal breath sounds.  Abdominal:     Palpations: Abdomen is soft.     Tenderness: There is abdominal tenderness.  Musculoskeletal:        General: No swelling.     Cervical back: Neck supple.  Skin:    General: Skin is warm and dry.     Capillary Refill: Capillary refill takes  less than 2 seconds.  Neurological:     Mental Status: She is alert.  Psychiatric:        Mood and Affect: Mood normal.     (all labs ordered are listed, but only abnormal results are displayed) Labs Reviewed  CBC WITH DIFFERENTIAL/PLATELET - Abnormal; Notable for the following components:      Result Value   WBC 14.5 (*)    RBC 5.45 (*)    Hemoglobin 16.5 (*)    HCT 49.1 (*)    Platelets 450 (*)    Neutro Abs 9.3 (*)    Lymphs Abs 4.3 (*)    All other components within normal limits  I-STAT CHEM 8, ED - Abnormal; Notable for the following components:   Potassium 5.2 (*)    BUN 46 (*)    Creatinine, Ser 1.80 (*)    Glucose, Bld 122 (*)    Calcium , Ion 1.11 (*)    Hemoglobin 16.7 (*)    HCT 49.0 (*)    All other components within normal limits  RESP PANEL BY RT-PCR (RSV, FLU A&B, COVID)  RVPGX2  ETHANOL  COMPREHENSIVE METABOLIC PANEL WITH GFR  LIPASE, BLOOD  PRO BRAIN NATRIURETIC PEPTIDE  URINALYSIS, ROUTINE W REFLEX MICROSCOPIC  URINE DRUG SCREEN  POC OCCULT BLOOD, ED  TROPONIN T, HIGH SENSITIVITY    EKG: EKG Interpretation Date/Time:  Thursday March 29 2024 13:39:00 EST Ventricular Rate:  111 PR Interval:  146 QRS Duration:  140 QT Interval:  361 QTC Calculation: 491 R Axis:   233  Text Interpretation: Sinus tachycardia Confirmed by Ruthe Cornet [phone]) on 03/29/2024 2:32:07 PM  Radiology: DG Chest Portable 1 View Result Date: 03/29/2024 CLINICAL DATA:  Cough with fever and chest pain. EXAM: PORTABLE CHEST 1 VIEW COMPARISON:  February 07, 2024 FINDINGS: The heart size and mediastinal contours are within normal limits. Both lungs are clear. The visualized skeletal structures are unremarkable. IMPRESSION: No active disease. Electronically Signed   By: Suzen Dials M.D.   On: 03/29/2024 14:09     Procedures   Medications Ordered in the ED  sodium chloride  0.9 % bolus 1,000 mL (1,000 mLs Intravenous New Bag/Given 03/29/24 1356)  HYDROmorphone   (DILAUDID ) injection 1 mg (1 mg Intravenous Given 03/29/24 1358)  prochlorperazine  (COMPAZINE ) injection 10 mg (10 mg Intravenous Given 03/29/24 1357)  diphenhydrAMINE  (BENADRYL ) injection 12.5 mg (12.5 mg Intravenous Given 03/29/24 1357)  pantoprazole  (PROTONIX ) injection 40 mg (40 mg Intravenous Given 03/29/24 1405)  Medical Decision Making Amount and/or Complexity of Data Reviewed Labs: ordered. Radiology: ordered.  Risk Prescription drug management.   SRISHTI STRNAD is here with abdominal pain nausea vomiting and chest pain.  Has not felt well for the last several days.  Has history of CAD polysubstance abuse schizophrenia chronic hep C.  EKG upon arrival here shows some ST changes but unchanged from prior.  Clinically low suspicion for STEMI.  Seen by Dr. Verlin and code STEMI was canceled.  Seems like more of a possibly viral process or pancreatitis or colitis or more intra-abdominal process and cardiac process.  Will get labs including troponin CBC CMP lipase urinalysis CT chest abdomen and pelvis dissection study will give Dilaudid  Compazine  Benadryl  fluid bolus check viral panel.  Seems less likely to be cardiac in nature.  Do not really have any concern for PE.  Vital signs overall reassuring.  Patient very uncomfortable in the epigastric region of the abdomen.  She has noticed some blood in the stool but her Hemoccult is negative here.  Do not see any gross hematochezia or melena on exam.  She has noticed bright red blood at times in the stool.  She has had ulcers in the past.  Hemoccult is negative.  Hemoglobin 16.5.  Mild leukocytosis with a white count of 14.5.  Electrolytes somewhat unremarkable.  Creatinine appears to be at baseline.  Chest x-ray with no evidence of pneumonia pneumothorax.  Ultimately patient is awaiting for remaining lab work and CT of the chest abdomen and pelvis.  Handed off to oncoming ED staff with patient pending workup.   See their note for further results evaluation and disposition of the patient.  This chart was dictated using voice recognition software.  Despite best efforts to proofread,  errors can occur which can change the documentation meaning.      Final diagnoses:  Abdominal pain, unspecified abdominal location  Chest pain, unspecified type  Nausea and vomiting, unspecified vomiting type    ED Discharge Orders     None          Ruthe Cornet, DO 03/29/24 1447  "

## 2024-03-29 NOTE — ED Notes (Addendum)
CMP recollected and sent to main lab.

## 2024-03-30 DIAGNOSIS — G8929 Other chronic pain: Secondary | ICD-10-CM | POA: Diagnosis not present

## 2024-03-30 DIAGNOSIS — R0789 Other chest pain: Secondary | ICD-10-CM | POA: Diagnosis not present

## 2024-03-30 DIAGNOSIS — K5900 Constipation, unspecified: Secondary | ICD-10-CM

## 2024-03-30 DIAGNOSIS — R1013 Epigastric pain: Secondary | ICD-10-CM | POA: Diagnosis not present

## 2024-03-30 DIAGNOSIS — K625 Hemorrhage of anus and rectum: Secondary | ICD-10-CM

## 2024-03-30 DIAGNOSIS — R079 Chest pain, unspecified: Secondary | ICD-10-CM

## 2024-03-30 LAB — COMPREHENSIVE METABOLIC PANEL WITH GFR
ALT: 15 U/L (ref 0–44)
AST: 34 U/L (ref 15–41)
Albumin: 3.9 g/dL (ref 3.5–5.0)
Alkaline Phosphatase: 94 U/L (ref 38–126)
Anion gap: 11 (ref 5–15)
BUN: 38 mg/dL — ABNORMAL HIGH (ref 6–20)
CO2: 21 mmol/L — ABNORMAL LOW (ref 22–32)
Calcium: 10 mg/dL (ref 8.9–10.3)
Chloride: 101 mmol/L (ref 98–111)
Creatinine, Ser: 1.52 mg/dL — ABNORMAL HIGH (ref 0.44–1.00)
GFR, Estimated: 39 mL/min — ABNORMAL LOW
Glucose, Bld: 76 mg/dL (ref 70–99)
Potassium: 4.6 mmol/L (ref 3.5–5.1)
Sodium: 132 mmol/L — ABNORMAL LOW (ref 135–145)
Total Bilirubin: 0.3 mg/dL (ref 0.0–1.2)
Total Protein: 7.2 g/dL (ref 6.5–8.1)

## 2024-03-30 LAB — MRSA NEXT GEN BY PCR, NASAL: MRSA by PCR Next Gen: NOT DETECTED

## 2024-03-30 LAB — CBC
HCT: 44.8 % (ref 36.0–46.0)
Hemoglobin: 14.7 g/dL (ref 12.0–15.0)
MCH: 30.3 pg (ref 26.0–34.0)
MCHC: 32.8 g/dL (ref 30.0–36.0)
MCV: 92.4 fL (ref 80.0–100.0)
Platelets: 328 K/uL (ref 150–400)
RBC: 4.85 MIL/uL (ref 3.87–5.11)
RDW: 14.5 % (ref 11.5–15.5)
WBC: 14.6 K/uL — ABNORMAL HIGH (ref 4.0–10.5)
nRBC: 0 % (ref 0.0–0.2)

## 2024-03-30 LAB — TROPONIN T, HIGH SENSITIVITY: Troponin T High Sensitivity: 135 ng/L (ref 0–19)

## 2024-03-30 MED ORDER — TRAZODONE HCL 50 MG PO TABS
50.0000 mg | ORAL_TABLET | Freq: Every evening | ORAL | Status: DC | PRN
  Administered 2024-03-30 – 2024-04-02 (×4): 50 mg via ORAL
  Filled 2024-03-30 (×3): qty 1

## 2024-03-30 MED ORDER — LORAZEPAM 1 MG PO TABS
0.5000 mg | ORAL_TABLET | Freq: Two times a day (BID) | ORAL | Status: DC | PRN
  Administered 2024-03-30 – 2024-04-03 (×8): 0.5 mg via ORAL
  Filled 2024-03-30 (×5): qty 1

## 2024-03-30 MED ORDER — UMECLIDINIUM BROMIDE 62.5 MCG/ACT IN AEPB
1.0000 | INHALATION_SPRAY | Freq: Every day | RESPIRATORY_TRACT | Status: DC
  Administered 2024-03-30 – 2024-04-02 (×4): 1 via RESPIRATORY_TRACT
  Filled 2024-03-30: qty 7

## 2024-03-30 MED ORDER — PANTOPRAZOLE SODIUM 40 MG IV SOLR
40.0000 mg | Freq: Two times a day (BID) | INTRAVENOUS | Status: DC
  Administered 2024-03-30 – 2024-04-02 (×7): 40 mg via INTRAVENOUS
  Filled 2024-03-30 (×6): qty 10

## 2024-03-30 NOTE — Progress Notes (Signed)
 "  Rounding Note   Patient Name: Carol Henderson Date of Encounter: 03/30/2024  [facility] Cardiologist: Cara Lovelace MD  Subjective Patient complains of abdominal pain  Scheduled Meds:  amLODipine   10 mg Oral Daily   carvedilol   3.125 mg Oral BID WC   gabapentin   400 mg Oral TID   pantoprazole   40 mg Oral BID   rosuvastatin   20 mg Oral Daily   sucralfate   1 g Oral TID AC & HS   Continuous Infusions:  PRN Meds: acetaminophen  **OR** acetaminophen , hydrALAZINE , HYDROmorphone  (DILAUDID ) injection, nitroGLYCERIN , ondansetron  **OR** ondansetron  (ZOFRAN ) IV, mouth rinse, oxyCODONE    Vital Signs  Vitals:   03/29/24 2317 03/30/24 0330 03/30/24 0755 03/30/24 0756  BP: (!) 163/101 (!) 155/91 (!) 171/144 (!) 164/83  Pulse: (!) 104 78 91 88  Resp: 20 16 18    Temp: 98.5 F (36.9 C) 98.5 F (36.9 C) 98.6 F (37 C)   TempSrc: Oral Oral Oral   SpO2: 99% 90% 92% 94%  Weight:      Height:       No intake or output data in the 24 hours ending 03/30/24 0855    03/29/2024    7:43 PM 02/07/2024    7:36 PM 02/06/2024    2:24 PM  Last 3 Weights  Weight (lbs) 126 lb 8.7 oz 135 lb 9.3 oz 135 lb 9.6 oz  Weight (kg) 57.4 kg 61.5 kg 61.508 kg      Telemetry Sinus rhythm/sinus tachycardia- Personally Reviewed  ECG  Sinus rhythm at 81 with right bundle branch block unchanged from prior EKGs- Personally Reviewed  Physical Exam  GEN: No acute distress.   Neck: No JVD Cardiac: RRR, no murmurs, rubs, or gallops.  Respiratory: Clear to auscultation bilaterally. GI: Tender to palpation MS: No edema; No deformity. Neuro:  Nonfocal  Psych: Normal affect   Labs High Sensitivity Troponin:  No results for input(s): TROPONINIHS in the last 720 hours.  Recent Labs  Lab 03/29/24 1533 03/29/24 2207 03/30/24 0010  TRNPT 118* 136* 135*       Chemistry Recent Labs  Lab 03/29/24 1355 03/29/24 1533 03/29/24 2207 03/30/24 0231  NA 136 135  --  132*  K 5.2* 6.5*  --  4.6   CL 106 104  --  101  CO2  --  17*  --  21*  GLUCOSE 122* 82  --  76  BUN 46* 38*  --  38*  CREATININE 1.80* 1.55*  --  1.52*  CALCIUM   --  9.9  --  10.0  MG  --   --  2.7*  --   PROT  --  7.9  --  7.2  ALBUMIN  --  4.1  --  3.9  AST  --  46*  --  34  ALT  --  16  --  15  ALKPHOS  --  101  --  94  BILITOT  --  0.4  --  0.3  GFRNONAA  --  38*  --  39*  ANIONGAP  --  14  --  11    Lipids No results for input(s): CHOL, TRIG, HDL, LABVLDL, LDLCALC, CHOLHDL in the last 168 hours.  Hematology Recent Labs  Lab 03/29/24 1350 03/29/24 1355 03/30/24 0231  WBC 14.5*  --  14.6*  RBC 5.45*  --  4.85  HGB 16.5* 16.7* 14.7  HCT 49.1* 49.0* 44.8  MCV 90.1  --  92.4  MCH 30.3  --  30.3  MCHC 33.6  --  32.8  RDW 14.5  --  14.5  PLT 450*  --  328   Thyroid  No results for input(s): TSH, FREET4 in the last 168 hours.  BNP Recent Labs  Lab 03/29/24 1533  PROBNP >35,000.0*    DDimer No results for input(s): DDIMER in the last 168 hours.   Radiology  CT Angio Chest/Abd/Pel for Dissection W and/or Wo Contrast Result Date: 03/29/2024 CLINICAL DATA:  Chest pain, abdominal pain. Acute aortic syndrome suspected. EXAM: CT ANGIOGRAPHY CHEST, ABDOMEN AND PELVIS TECHNIQUE: Non-contrast CT of the chest was initially obtained. Multidetector CT imaging through the chest, abdomen and pelvis was performed using the standard protocol during bolus administration of intravenous contrast. Multiplanar reconstructed images and MIPs were obtained and reviewed to evaluate the vascular anatomy. RADIATION DOSE REDUCTION: This exam was performed according to the departmental dose-optimization program which includes automated exposure control, adjustment of the mA and/or kV according to patient size and/or use of iterative reconstruction technique. CONTRAST:  70mL OMNIPAQUE  IOHEXOL  350 MG/ML SOLN COMPARISON:  CT abdomen pelvis 02/08/2024 CT chest 04/24/2018. FINDINGS: CTA CHEST FINDINGS Cardiovascular:  No intramural hematoma on precontrast imaging. No aortic aneurysm or dissection on postcontrast imaging. Atherosclerotic calcification of the aorta with age advanced involvement of all 3 coronary arteries. Left ventricular hypertrophy. Heart is at the upper limits of normal in size. No pericardial effusion. Mediastinum/Nodes: Thoracic inlet lymph nodes are not enlarged by CT size criteria. No pathologically enlarged mediastinal, hilar or axillary lymph nodes. Esophagus is grossly unremarkable. Lungs/Pleura: Centrilobular and paraseptal emphysema. Smoking related respiratory bronchiolitis. Mild dependent atelectasis bilaterally. No pleural fluid. Minimal debris in the airway. Musculoskeletal: Degenerative changes in the spine. Possible old sternal fracture. Review of the MIP images confirms the above findings. CTA ABDOMEN AND PELVIS FINDINGS VASCULAR Aorta: No aneurysm or dissection.  Atherosclerotic calcification. Celiac: Marked narrowing of the celiac trunk. SMA: Widely patent.  Atherosclerotic calcification. Renals: Widely patent.  Single bilaterally. IMA: Patent. Inflow: Atherosclerotic calcification.  No dissection or aneurysm. Veins: Poorly evaluated due to lack of opacification. Review of the MIP images confirms the above findings. NON-VASCULAR Hepatobiliary: Liver and gallbladder are unremarkable. Common bile duct measures up to 10 mm (coronal image 50 series 10). Pancreas: Negative. Spleen: Negative. Adrenals/Urinary Tract: Adrenal glands are unremarkable. Renal cortical thinning and scarring bilaterally. Ureters are decompressed. Bladder may be minimally thick-walled. Stomach/Bowel: Stomach, small bowel, appendix and colon are unremarkable. Lymphatic: No pathologically enlarged lymph nodes. Reproductive: Uterus is visualized.  No adnexal mass. Other: No free fluid.  Mesenteries and peritoneum are unremarkable. Musculoskeletal: Osteopenia. Old sacral insufficiency fractures. Degenerative changes in the  spine. Very minimal grade 1 anterolisthesis of L5 on S1 with bilateral L5 pars defects. Review of the MIP images confirms the above findings. IMPRESSION: 1. No evidence of acute aortic syndrome. 2. Marked narrowing of the celiac trunk. 3.  Age advanced three-vessel coronary artery calcification. 4. Left ventricular hypertrophy. 5. Dilated common bile duct. Please correlate clinically for right upper quadrant symptoms and consider nonemergent outpatient MR abdomen/MRCP with and without contrast, as clinically indicated. 6.  Aortic atherosclerosis (ICD10-I70.0). 7. Emphysema (ICD10-J43.9). Low-dose CT lung cancer screening is recommended for patients who are 22-65 years of age with a 20+ pack-year history of smoking and who are currently smoking or quit <=15 years ago. Electronically Signed   By: Newell Eke M.D.   On: 03/29/2024 16:50   DG Chest Portable 1 View Result Date: 03/29/2024 CLINICAL DATA:  Cough with fever  and chest pain. EXAM: PORTABLE CHEST 1 VIEW COMPARISON:  February 07, 2024 FINDINGS: The heart size and mediastinal contours are within normal limits. Both lungs are clear. The visualized skeletal structures are unremarkable. IMPRESSION: No active disease. Electronically Signed   By: Suzen Dials M.D.   On: 03/29/2024 14:09    Cardiac Studies Not performed  Patient Profile   59 year old thin and chronically ill-appearing Caucasian female with a history of tobacco abuse, hypertension, hyperlipidemia and schizophrenia.  She has had known CAD status post Cardi catheterization here on 11//25 that showed moderate distal left main disease, moderate mid LAD disease and ostial circumflex disease with patent circumflex and RCA stents.  Medical therapy was recommended at that time.  Patient presents with abdominal pain and rectal bleeding.  Assessment & Plan   1: CAD-cardiac catheterization several weeks ago showed moderate left main disease.  Prior stents in the RCA and circumflex were  patent.  Medical therapy was recommended.  Patient was admitted with abdominal pain and chest pain although she says her chest pain is most likely from her abdomen.  Her enzymes are elevated although flat at 135.  I do not think she has ACS.  I suspect all her symptoms are gastrointestinal.  She is not a candidate to be put on IV heparin  at this time.  We will sign off and be available if there are further cardiology related questions. [facility] will sign off.   The patient is not ready for discharge   from a cardiac standpoint. Medication Recommendations: None Other recommendations (labs, testing, etc): Per primary team Follow up as an outpatient: Follow-up with cardiology in [place]. For questions or updates, please contact [facility] Please consult [email or web address] for contact info under       Signed, Dorn Lesches, MD  03/30/2024, 8:55 AM    "

## 2024-03-30 NOTE — Plan of Care (Signed)
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
## 2024-03-30 NOTE — Plan of Care (Signed)
  Problem: Education: Goal: Knowledge of General Education information will improve Description: Including pain rating scale, medication(s)/side effects and non-pharmacologic comfort measures Outcome: Progressing   Problem: Activity: Goal: Risk for activity intolerance will decrease Outcome: Progressing   Problem: Elimination: Goal: Will not experience complications related to bowel motility Outcome: Progressing   Problem: Pain Managment: Goal: General experience of comfort will improve and/or be controlled Outcome: Progressing

## 2024-03-30 NOTE — Progress Notes (Signed)
 " PROGRESS NOTE    Carol Henderson  FMW:[id number] DOB: 02-23-1966 DOA: 03/29/2024 PCP: [name], MD   Brief Narrative:    Carol Henderson is a 58 y.o. female with medical history significant of CAD status post PCI, tobacco abuse, COPD, bipolar disorder, hypertension, hyperlipidemia, schizophrenia, multiple personality disorder, marijuana use presented with chest pain, abdominal pain, nausea and vomiting since 3 days. Also reporting bloody stools  Patient recently admitted on 02/07/24 with chest pain and underwent emergent cardiac cath during that admission.  Currently on dual antiplatelet therapy.   She smoke half a pack of cigarettes per day, uses marijuana every day however denies alcohol abuse.   ED Course: Upon arrival to ED: Patient afebrile, pulse 109, RR: 20, BP 153/119, maintaining oxygen saturation on room air COVID flu RSV negative.  POC occult blood negative.  CBC shows leukocytosis of 14.5, H&H 16.5/49.1, PLT 450.  Ethanol within normal limit.  Lipase: WNL.  UDS positive for marijuana.  UA negative for infection.  BNP more than 35,000.  NA: 135, K: 6.5, CR: 1.55, AST: 46, GFR: 38.  Chest x-ray negative.  CT angio chest abdomen pelvis shows no evidence of acute aortic syndrome.  Marked narrowing of celiac trunk.  Dilated CBD.  Radiology recommend nonemergent MRCP outpatient..  Initial troponin 32 trended up to 118.  Code STEMI called.  Evaluated by cardiology.  Recommend against starting heparin  in the setting of acute GI bleed,  Patient admitted for elevated troponin and ongoing abdominal pain with GI bleeding.     Assessment & Plan:   Principal Problem:   Chest pain Active Problems:   Coronary artery disease of native artery of native heart with stable angina pectoris   Abdominal pain, chronic, epigastric   Rectal bleeding   Chest pain of uncertain etiology   Constipation  Abdominal pain Bleeding per rectum: -Previous history of gastric ulcer but her pain is  out of proportion - CT abdomen and pelvis negative for acute abnormalities.  Narrowing of celiac trunk noted.  No ischemia. -consulted GI.  Broad differentials, hemorrhoids could be the source of bleeding. - Started on bowel regimen for constipation.  Pain could be from abdominal migraine. - Currently no plan for EGD or colonoscopy. - Continue Protonix . - Check hemoglobin in the a.m., currently hemoglobin stable at 14.    Chest pain: Elevated troponin: -Troponin elevated and peaked at 136.  Evaluated by cardiology.  Unlikely cardiac etiology of epigastric/chest pain.  Patient not a candidate for IV heparin  because of concern for GI bleeding.  Cardiology has signed off  History of CAD with stenting 2020: Emergent cath November 2025, no culprit lesion identified Medical management advised. Continue statin, Coreg , losartan  I do not see aspirin  in her medication list   HFpEF: - Patient appears euvolemic on exam.  Elevated proBNP.  Chest x-ray negative. - Recent 2D echo shows EF of 40%. - Will resume home medication.  Strict INO's and daily weight   Hypertension: Continue Coreg , amlodipine  and losartan .  Monitor blood pressure closely   COPD In the setting of tobacco abuse.  Currently on room air.  No wheezing noted on exam.  Chest x-ray negative.  Will continue to monitor   History of schizophrenia Bipolar: Will resume home medications   Hyperkalemia: - Was given dextrose , insulin  and Lokelma  in the ED. - Check magnesium level.  Repeat CMP tomorrow.   Dilated CBD: - Outpatient follow-up-need MRCP   CKD stage IIIb: - Received IV fluids in  ED.  Monitor closely.  Avoid nephrotoxic medications   Tobacco abuse Marijuana abuse: Counseled about cessation   Leukocytosis Thrombocytosis: Monitor   DVT prophylaxis: SCD Code Status: Full code Family Communication: None present at bedside.  Plan of care discussed with patient in length and he verbalized understanding and agreed with  it. Disposition Plan: To be determined Consults called: Cardiology, GI consulted Admission status: Inpatient    Subjective:  Patient seen and examined at the bedside.  Continues to report of ongoing abdominal pain.  Abdomen tender to palpation, seems to be out of proportion to her state, somewhat anxious  Objective: Vitals:   03/30/24 0330 03/30/24 0755 03/30/24 0756 03/30/24 1127  BP: (!) 155/91 (!) 171/144 (!) 164/83 (!) 168/99  Pulse: 78 91 88 84  Resp: 16 18  16   Temp: 98.5 F (36.9 C) 98.6 F (37 C)  98.7 F (37.1 C)  TempSrc: Oral Oral  Oral  SpO2: 90% 92% 94% 95%  Weight:      Height:        Intake/Output Summary (Last 24 hours) at 03/30/2024 1414 Last data filed at 03/30/2024 1156 Gross per 24 hour  Intake --  Output 400 ml  Net -400 ml   Filed Weights   03/29/24 1943  Weight: 57.4 kg    Examination:  General: Anxious: Not in any acute distress Chest: Clear CVS: S1, S2, no murmur, regular rhythm Abdomen: Soft, diffuse tenderness, (pt doesn't allow to touch) Extremities: No edema   Data Reviewed: I have personally reviewed following labs and imaging studies  CBC: Recent Labs  Lab 03/29/24 1350 03/29/24 1355 03/30/24 0231  WBC 14.5*  --  14.6*  NEUTROABS 9.3*  --   --   HGB 16.5* 16.7* 14.7  HCT 49.1* 49.0* 44.8  MCV 90.1  --  92.4  PLT 450*  --  328   Basic Metabolic Panel: Recent Labs  Lab 03/29/24 1355 03/29/24 1533 03/29/24 2207 03/30/24 0231  NA 136 135  --  132*  K 5.2* 6.5*  --  4.6  CL 106 104  --  101  CO2  --  17*  --  21*  GLUCOSE 122* 82  --  76  BUN 46* 38*  --  38*  CREATININE 1.80* 1.55*  --  1.52*  CALCIUM   --  9.9  --  10.0  MG  --   --  2.7*  --   PHOS  --   --  3.9  --    GFR: Estimated Creatinine Clearance: 31.9 mL/min (A) (by C-G formula based on SCr of 1.52 mg/dL (H)). Liver Function Tests: Recent Labs  Lab 03/29/24 1533 03/30/24 0231  AST 46* 34  ALT 16 15  ALKPHOS 101 94  BILITOT 0.4 0.3  PROT 7.9  7.2  ALBUMIN 4.1 3.9   Recent Labs  Lab 03/29/24 1533  LIPASE 43   No results for input(s): AMMONIA in the last 168 hours. Coagulation Profile: No results for input(s): INR, PROTIME in the last 168 hours. Cardiac Enzymes: No results for input(s): CKTOTAL, CKMB, CKMBINDEX, TROPONINI in the last 168 hours. BNP (last 3 results) Recent Labs    02/07/24 1948 03/29/24 1533  PROBNP >35,000.0* >35,000.0*   HbA1C: No results for input(s): HGBA1C in the last 72 hours. CBG: No results for input(s): GLUCAP in the last 168 hours. Lipid Profile: No results for input(s): CHOL, HDL, LDLCALC, TRIG, CHOLHDL, LDLDIRECT in the last 72 hours. Thyroid  Function Tests: No results for input(s):  TSH, T4TOTAL, FREET4, T3FREE, THYROIDAB in the last 72 hours. Anemia Panel: No results for input(s): VITAMINB12, FOLATE, FERRITIN, TIBC, IRON, RETICCTPCT in the last 72 hours. Sepsis Labs: No results for input(s): PROCALCITON, LATICACIDVEN in the last 168 hours.  Recent Results (from the past 240 hours)  Resp panel by RT-PCR (RSV, Flu A&B, Covid) Anterior Nasal Swab     Status: None   Collection Time: 03/29/24  1:48 PM   Specimen: Anterior Nasal Swab  Result Value Ref Range Status   SARS Coronavirus 2 by RT PCR NEGATIVE NEGATIVE Final   Influenza A by PCR NEGATIVE NEGATIVE Final   Influenza B by PCR NEGATIVE NEGATIVE Final    Comment: (NOTE) The Xpert Xpress SARS-CoV-2/FLU/RSV plus assay is intended as an aid in the diagnosis of influenza from Nasopharyngeal swab specimens and should not be used as a sole basis for treatment. Nasal washings and aspirates are unacceptable for Xpert Xpress SARS-CoV-2/FLU/RSV testing.  Fact Sheet for Patients: bloggercourse.com  Fact Sheet for Healthcare Providers: seriousbroker.it  This test is not yet approved or cleared by the [place]  FDA and has been  authorized for detection and/or diagnosis of SARS-CoV-2 by FDA under an Emergency Use Authorization (EUA). This EUA will remain in effect (meaning this test can be used) for the duration of the COVID-19 declaration under Section 564(b)(1) of the Act, 21 U.S.C. section 360bbb-3(b)(1), unless the authorization is terminated or revoked.     Resp Syncytial Virus by PCR NEGATIVE NEGATIVE Final    Comment: (NOTE) Fact Sheet for Patients: bloggercourse.com  Fact Sheet for Healthcare Providers: seriousbroker.it  This test is not yet approved or cleared by the [place]  FDA and has been authorized for detection and/or diagnosis of SARS-CoV-2 by FDA under an Emergency Use Authorization (EUA). This EUA will remain in effect (meaning this test can be used) for the duration of the COVID-19 declaration under Section 564(b)(1) of the Act, 21 U.S.C. section 360bbb-3(b)(1), unless the authorization is terminated or revoked.  Performed at [facility], [address], [place], [place] [address]   MRSA Next Gen by PCR, Nasal     Status: None   Collection Time: 03/29/24 10:57 PM   Specimen: Nasal Mucosa; Nasal Swab  Result Value Ref Range Status   MRSA by PCR Next Gen NOT DETECTED NOT DETECTED Final    Comment: (NOTE) The GeneXpert MRSA Assay (FDA approved for NASAL specimens only), is one component of a comprehensive MRSA colonization surveillance program. It is not intended to diagnose MRSA infection nor to guide or monitor treatment for MRSA infections. Test performance is not FDA approved in patients less than 51 years old. Performed at [facility], [address], [place], [place] [address]          Radiology Studies: CT Angio Chest/Abd/Pel for Dissection W and/or Wo Contrast Result Date: 03/29/2024 CLINICAL DATA:  Chest pain, abdominal pain. Acute aortic syndrome suspected. EXAM: CT ANGIOGRAPHY CHEST, ABDOMEN AND  PELVIS TECHNIQUE: Non-contrast CT of the chest was initially obtained. Multidetector CT imaging through the chest, abdomen and pelvis was performed using the standard protocol during bolus administration of intravenous contrast. Multiplanar reconstructed images and MIPs were obtained and reviewed to evaluate the vascular anatomy. RADIATION DOSE REDUCTION: This exam was performed according to the departmental dose-optimization program which includes automated exposure control, adjustment of the mA and/or kV according to patient size and/or use of iterative reconstruction technique. CONTRAST:  70mL OMNIPAQUE  IOHEXOL  350 MG/ML SOLN COMPARISON:  CT  abdomen pelvis 02/08/2024 CT chest 04/24/2018. FINDINGS: CTA CHEST FINDINGS Cardiovascular: No intramural hematoma on precontrast imaging. No aortic aneurysm or dissection on postcontrast imaging. Atherosclerotic calcification of the aorta with age advanced involvement of all 3 coronary arteries. Left ventricular hypertrophy. Heart is at the upper limits of normal in size. No pericardial effusion. Mediastinum/Nodes: Thoracic inlet lymph nodes are not enlarged by CT size criteria. No pathologically enlarged mediastinal, hilar or axillary lymph nodes. Esophagus is grossly unremarkable. Lungs/Pleura: Centrilobular and paraseptal emphysema. Smoking related respiratory bronchiolitis. Mild dependent atelectasis bilaterally. No pleural fluid. Minimal debris in the airway. Musculoskeletal: Degenerative changes in the spine. Possible old sternal fracture. Review of the MIP images confirms the above findings. CTA ABDOMEN AND PELVIS FINDINGS VASCULAR Aorta: No aneurysm or dissection.  Atherosclerotic calcification. Celiac: Marked narrowing of the celiac trunk. SMA: Widely patent.  Atherosclerotic calcification. Renals: Widely patent.  Single bilaterally. IMA: Patent. Inflow: Atherosclerotic calcification.  No dissection or aneurysm. Veins: Poorly evaluated due to lack of opacification.  Review of the MIP images confirms the above findings. NON-VASCULAR Hepatobiliary: Liver and gallbladder are unremarkable. Common bile duct measures up to 10 mm (coronal image 50 series 10). Pancreas: Negative. Spleen: Negative. Adrenals/Urinary Tract: Adrenal glands are unremarkable. Renal cortical thinning and scarring bilaterally. Ureters are decompressed. Bladder may be minimally thick-walled. Stomach/Bowel: Stomach, small bowel, appendix and colon are unremarkable. Lymphatic: No pathologically enlarged lymph nodes. Reproductive: Uterus is visualized.  No adnexal mass. Other: No free fluid.  Mesenteries and peritoneum are unremarkable. Musculoskeletal: Osteopenia. Old sacral insufficiency fractures. Degenerative changes in the spine. Very minimal grade 1 anterolisthesis of L5 on S1 with bilateral L5 pars defects. Review of the MIP images confirms the above findings. IMPRESSION: 1. No evidence of acute aortic syndrome. 2. Marked narrowing of the celiac trunk. 3.  Age advanced three-vessel coronary artery calcification. 4. Left ventricular hypertrophy. 5. Dilated common bile duct. Please correlate clinically for right upper quadrant symptoms and consider nonemergent outpatient MR abdomen/MRCP with and without contrast, as clinically indicated. 6.  Aortic atherosclerosis (ICD10-I70.0). 7. Emphysema (ICD10-J43.9). Low-dose CT lung cancer screening is recommended for patients who are 86-36 years of age with a 20+ pack-year history of smoking and who are currently smoking or quit <=15 years ago. Electronically Signed   By: Newell Eke M.D.   On: 03/29/2024 16:50   DG Chest Portable 1 View Result Date: 03/29/2024 CLINICAL DATA:  Cough with fever and chest pain. EXAM: PORTABLE CHEST 1 VIEW COMPARISON:  February 07, 2024 FINDINGS: The heart size and mediastinal contours are within normal limits. Both lungs are clear. The visualized skeletal structures are unremarkable. IMPRESSION: No active disease. Electronically  Signed   By: Suzen Dials M.D.   On: 03/29/2024 14:09        Scheduled Meds:  amLODipine   10 mg Oral Daily   carvedilol   3.125 mg Oral BID WC   gabapentin   400 mg Oral TID   pantoprazole  (PROTONIX ) IV  40 mg Intravenous Q12H   rosuvastatin   20 mg Oral Daily   sucralfate   1 g Oral TID AC & HS   Continuous Infusions:        Zikeria Keough, MD [facility] 03/30/2024, 2:14 PM   "

## 2024-03-30 NOTE — TOC Initial Note (Signed)
 Transition of Care [facility]) - Initial/Assessment Note    Patient Details  Name: Carol Henderson MRN: [id number] Date of Birth: 27-Dec-1965  [facility] CM/SW Contact:    Lauraine FORBES Saa, LCSWA Phone Number: 03/30/2024, 12:11 PM  Clinical Narrative:                  12:11 PM CSW introduced self and role to patient. Patient informed CSW that she resides at home with her sister and brother in law. Patient stated that her sister provides transport to appointments. Patient confirmed that she does not have SNF/HH/DME history but expressed interest in home oxygen if eligible. CSW made medical team aware. Patient declined CSW offer of SDOH (housing) resources. Patient informed CSW that she receives disability and SSI. Per chart review, patient has a PCP and insurance. TOC will continue to follow.  Expected Discharge Plan: Home/Self Care Barriers to Discharge: Continued Medical Work up   Patient Goals and CMS Choice Patient states their goals for this hospitalization and ongoing recovery are:: to return home          Expected Discharge Plan and Services   Discharge Planning Services: CM Consult   Living arrangements for the past 2 months: Single Family Home                                      Prior Living Arrangements/Services Living arrangements for the past 2 months: Single Family Home Lives with:: Relatives, Siblings Patient language and need for interpreter reviewed:: Yes        Need for Family Participation in Patient Care: No (Comment) Care giver support system in place?: Yes (comment)   Criminal Activity/Legal Involvement Pertinent to Current Situation/Hospitalization: No - Comment as needed  Activities of Daily Living   ADL Screening (condition at time of admission) Independently performs ADLs?: Yes (appropriate for developmental age) Is the patient deaf or have difficulty hearing?: No Does the patient have difficulty seeing, even when wearing  glasses/contacts?: No Does the patient have difficulty concentrating, remembering, or making decisions?: No  Permission Sought/Granted Permission sought to share information with : Family Supports Permission granted to share information with : Yes, Verbal Permission Granted  Share Information with NAME: [name]     Permission granted to share info w Relationship: Sister  Permission granted to share info w Contact Information: [phone]  Emotional Assessment Appearance:: Appears stated age Attitude/Demeanor/Rapport: Engaged Affect (typically observed): Accepting, Appropriate, Adaptable, Calm, Stable, Pleasant Orientation: : Oriented to Self, Oriented to Place, Oriented to  Time, Oriented to Situation Alcohol / Substance Use: Not Applicable Psych Involvement: No (comment)  Admission diagnosis:  Other chest pain [R07.89] Gastroesophageal reflux disease without esophagitis [K21.9] Essential hypertension [I10] Chest pain [R07.9] 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, 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] Abdominal pain, unspecified abdominal location [R10.9] Chest pain, unspecified type [R07.9] Nausea and vomiting, unspecified vomiting type [R11.2] Patient Active Problem List   Diagnosis Date Noted   Constipation 03/30/2024   Chest pain 03/29/2024   RSV infection 02/10/2024   Melanotic stools 02/10/2024   Demand ischemia of myocardium (HCC) 02/08/2024   Tension headache 01/10/2024   GAD (generalized anxiety disorder) 10/21/2023   GI bleeding 09/23/2018   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   Elevated troponin 05/14/2018   Acute ST elevation myocardial infarction (STEMI) of inferolateral wall (HCC)    Chest pain of uncertain etiology 04/24/2018   Right shoulder pain 10/15/2016  Chronic radicular low back pain 06/02/2016   Dyslipidemia 06/02/2016   S/P drug eluting coronary stent placement 05/31/2016   Right wrist pain 05/05/2016   Benign neoplasm of ascending colon    Special screening  for malignant neoplasms, colon    First degree hemorrhoids    Angina at rest 07/16/2015   Sacral insufficiency fracture 07/08/2015   COPD (chronic obstructive pulmonary disease) (HCC) 07/08/2015   Left-sided chest pain 07/08/2015   Rectal bleeding 06/18/2015   Abdominal pain 06/18/2015   Affective bipolar disorder (HCC) 02/12/2015   Breast pain 02/12/2015   CAFL (chronic airflow limitation) (HCC) 02/12/2015   Abdominal pain, chronic, epigastric 02/05/2015   Gallstones 02/05/2015   Gallstones without obstruction of gallbladder 01/30/2015   Epigastric abdominal pain 01/21/2015   Abnormal finding on EKG 01/21/2015   Epigastric abdominal pain 01/21/2015   Abdominal pain, acute, epigastric 12/31/2014   Abnormal toxicological findings 09/20/2014   Positive urine drug screen 09/20/2014   Continuous opioid dependence (HCC) 06/20/2014   Hypomagnesemia 08/18/2013   Arterial blood pressure decreased 08/18/2013   Hypotension 08/18/2013   Syncope 08/18/2013   Decreased potassium in the blood 07/09/2013   Pain of metastatic malignancy 07/03/2013   Chemotherapy induced nausea and vomiting 06/19/2013   History of cancer of vulva 05/27/2013   Vulva cancer (HCC) 05/27/2013   HEPATITIS C 11/30/2006   ABUSE, OTHER/MIXED/UNSPECIFIED DRUG, EPISODIC 11/30/2006   Essential hypertension 11/30/2006   Coronary artery disease of native artery of native heart with stable angina pectoris 11/30/2006   Peptic ulcer 11/30/2006   BORDERLINE PERSONALITY 06/02/2006   TOBACCO DEPENDENCE 06/02/2006   HYPERTENSION, BENIGN SYSTEMIC 06/02/2006   Asthma, mild intermittent, well-controlled 06/02/2006   GASTROESOPHAGEAL REFLUX, NO ESOPHAGITIS 06/02/2006   PCP:  [name], MD Pharmacy:   [facility] [address] [place] - [address] [place] [address] Phone: [phone] Fax: [phone]  [facility] [address], [place] - [address] [place] [address] [place] [address] Phone: [phone] Fax: [phone]  [facility] [address] [place] [address] Phone: [phone] Fax: [phone]     Social Drivers of Health (SDOH) Social History: SDOH Screenings   Food Insecurity: No Food Insecurity (03/29/2024)  Housing: High Risk (03/29/2024)  Transportation Needs: No Transportation Needs (03/29/2024)  Utilities: Not At Risk (03/29/2024)  Depression (PHQ2-9): High Risk (10/21/2023)  Financial Resource Strain: Low Risk  (11/08/2023)   [facility]  Tobacco Use: High Risk (03/29/2024)   SDOH Interventions: Housing Interventions: Patient Declined, Inpatient TOC   Readmission Risk Interventions    02/09/2024   11:38 AM  Readmission Risk Prevention Plan  Transportation Screening Complete  HRI or Home Care Consult Complete  Social Work Consult for Recovery Care Planning/Counseling Complete  Palliative Care Screening Not Applicable  Medication Review Oceanographer) Referral to Pharmacy

## 2024-03-31 ENCOUNTER — Inpatient Hospital Stay (HOSPITAL_COMMUNITY)

## 2024-03-31 DIAGNOSIS — G8929 Other chronic pain: Secondary | ICD-10-CM | POA: Diagnosis not present

## 2024-03-31 DIAGNOSIS — K5900 Constipation, unspecified: Secondary | ICD-10-CM | POA: Diagnosis not present

## 2024-03-31 DIAGNOSIS — K625 Hemorrhage of anus and rectum: Secondary | ICD-10-CM | POA: Diagnosis not present

## 2024-03-31 DIAGNOSIS — R1013 Epigastric pain: Secondary | ICD-10-CM | POA: Diagnosis not present

## 2024-03-31 DIAGNOSIS — R079 Chest pain, unspecified: Secondary | ICD-10-CM | POA: Diagnosis not present

## 2024-03-31 LAB — BASIC METABOLIC PANEL WITH GFR
Anion gap: 8 (ref 5–15)
BUN: 30 mg/dL — ABNORMAL HIGH (ref 6–20)
CO2: 24 mmol/L (ref 22–32)
Calcium: 9.1 mg/dL (ref 8.9–10.3)
Chloride: 100 mmol/L (ref 98–111)
Creatinine, Ser: 1.37 mg/dL — ABNORMAL HIGH (ref 0.44–1.00)
GFR, Estimated: 45 mL/min — ABNORMAL LOW
Glucose, Bld: 83 mg/dL (ref 70–99)
Potassium: 4.2 mmol/L (ref 3.5–5.1)
Sodium: 131 mmol/L — ABNORMAL LOW (ref 135–145)

## 2024-03-31 LAB — CBC WITH DIFFERENTIAL/PLATELET
Abs Immature Granulocytes: 0.02 K/uL (ref 0.00–0.07)
Basophils Absolute: 0.1 K/uL (ref 0.0–0.1)
Basophils Relative: 1 %
Eosinophils Absolute: 0.4 K/uL (ref 0.0–0.5)
Eosinophils Relative: 4 %
HCT: 40 % (ref 36.0–46.0)
Hemoglobin: 13.5 g/dL (ref 12.0–15.0)
Immature Granulocytes: 0 %
Lymphocytes Relative: 45 %
Lymphs Abs: 4 K/uL (ref 0.7–4.0)
MCH: 30.6 pg (ref 26.0–34.0)
MCHC: 33.8 g/dL (ref 30.0–36.0)
MCV: 90.7 fL (ref 80.0–100.0)
Monocytes Absolute: 0.8 K/uL (ref 0.1–1.0)
Monocytes Relative: 9 %
Neutro Abs: 3.6 K/uL (ref 1.7–7.7)
Neutrophils Relative %: 41 %
Platelets: 297 K/uL (ref 150–400)
RBC: 4.41 MIL/uL (ref 3.87–5.11)
RDW: 13.7 % (ref 11.5–15.5)
WBC: 8.9 K/uL (ref 4.0–10.5)
nRBC: 0 % (ref 0.0–0.2)

## 2024-03-31 MED ORDER — DULOXETINE HCL 30 MG PO CPEP
30.0000 mg | ORAL_CAPSULE | Freq: Every day | ORAL | Status: DC
  Administered 2024-03-31 – 2024-04-03 (×4): 30 mg via ORAL
  Filled 2024-03-31 (×2): qty 1

## 2024-03-31 MED ORDER — TECHNETIUM TC 99M MEBROFENIN IV KIT
5.0000 | PACK | Freq: Once | INTRAVENOUS | Status: AC | PRN
  Administered 2024-03-31: 5 via INTRAVENOUS

## 2024-03-31 MED ORDER — LIDOCAINE 5 % EX PTCH
1.0000 | MEDICATED_PATCH | CUTANEOUS | Status: DC
  Administered 2024-03-31 – 2024-04-01 (×2): 1 via TRANSDERMAL
  Filled 2024-03-31 (×2): qty 1

## 2024-03-31 MED ORDER — POLYETHYLENE GLYCOL 3350 17 G PO PACK
17.0000 g | PACK | Freq: Two times a day (BID) | ORAL | Status: DC
  Administered 2024-04-01 – 2024-04-02 (×2): 17 g via ORAL
  Filled 2024-03-31 (×4): qty 1

## 2024-03-31 MED ORDER — SENNA 8.6 MG PO TABS: 1.0000 | ORAL_TABLET | Freq: Every day | ORAL | Status: DC | PRN

## 2024-03-31 MED ORDER — HYDROCORTISONE ACETATE 25 MG RE SUPP
25.0000 mg | Freq: Two times a day (BID) | RECTAL | Status: DC | PRN
  Administered 2024-03-31 – 2024-04-01 (×2): 25 mg via RECTAL
  Filled 2024-03-31 (×3): qty 1

## 2024-03-31 NOTE — Progress Notes (Signed)
 " PROGRESS NOTE    Carol Henderson  FMW:[id number] DOB: 09-14-1965 DOA: 03/29/2024 PCP: [name], MD   Brief Narrative:    Carol Henderson is a 58 y.o. female with medical history significant of CAD status post PCI, tobacco abuse, COPD, bipolar disorder, hypertension, hyperlipidemia, schizophrenia, multiple personality disorder, marijuana use presented with chest pain, abdominal pain, nausea and vomiting since 3 days. Also reporting bloody stools  Patient recently admitted on 02/07/24 with chest pain and underwent emergent cardiac cath during that admission.  Currently on dual antiplatelet therapy.   She smoke half a pack of cigarettes per day, uses marijuana every day however denies alcohol abuse.   ED Course: Upon arrival to ED: Patient afebrile, pulse 109, RR: 20, BP 153/119, maintaining oxygen saturation on room air COVID flu RSV negative.  POC occult blood negative.  CBC shows leukocytosis of 14.5, H&H 16.5/49.1, PLT 450.  Ethanol within normal limit.  Lipase: WNL.  UDS positive for marijuana.  UA negative for infection.  BNP more than 35,000.  NA: 135, K: 6.5, CR: 1.55, AST: 46, GFR: 38.  Chest x-ray negative.  CT angio chest abdomen pelvis shows no evidence of acute aortic syndrome.  Marked narrowing of celiac trunk.  Dilated CBD.  Radiology recommend nonemergent MRCP outpatient..  Initial troponin 32 trended up to 118.  Code STEMI called.  Evaluated by cardiology.  Recommend against starting heparin  in the setting of acute GI bleed,  Patient admitted for elevated troponin and ongoing abdominal pain with GI bleeding.     Assessment & Plan:   Principal Problem:   Chest pain Active Problems:   Coronary artery disease of native artery of native heart with stable angina pectoris   Abdominal pain, chronic, epigastric   Rectal bleeding   Chest pain of uncertain etiology   Constipation  Abdominal pain Bleeding per rectum: -Previous history of gastric ulcer but her pain is  out of proportion - CT abdomen and pelvis negative for acute abnormalities.  Narrowing of celiac trunk noted.  No ischemia. -consulted GI.  Broad differentials, hemorrhoids could be the source of bleeding. - Started on bowel regimen for constipation.  Pain could be from abdominal migraine. - Currently no plan for EGD or colonoscopy. - Continue Protonix . - Check hemoglobin in the a.m., currently hemoglobin stable at 14. - HIDA scan is negative for acute findings. - Pain likely functional.  Patient has  anxiety.  Has problem with depressions in the past.  Will start duloxetine .  Will benefit outpatient psychiatric eval/treatment    Chest pain: Elevated troponin: -Troponin elevated and peaked at 136.  Evaluated by cardiology.  Unlikely cardiac etiology of epigastric/chest pain.  Patient not a candidate for IV heparin  because of concern for GI bleeding.  Cardiology has signed off  History of CAD with stenting 2020: Emergent cath November 2025, no culprit lesion identified Medical management advised. Continue statin, Coreg , losartan  I do not see aspirin  in her medication list   HFpEF: - Patient appears euvolemic on exam.  Elevated proBNP.  Chest x-ray negative. - Recent 2D echo shows EF of 40%. - Will resume home medication.  Strict INO's and daily weight   Hypertension: Continue Coreg , amlodipine  and losartan .  Monitor blood pressure closely   COPD In the setting of tobacco abuse.  Currently on room air.  No wheezing noted on exam.  Chest x-ray negative.  Will continue to monitor   History of schizophrenia Bipolar: Will resume home medications   Hyperkalemia: -  Was given dextrose , insulin  and Lokelma  in the ED. - Check magnesium level.  Repeat CMP tomorrow.   Dilated CBD: - Outpatient follow-up-need MRCP   CKD stage IIIb: - Received IV fluids in ED.  Monitor closely.  Avoid nephrotoxic medications   Tobacco abuse Marijuana abuse: Counseled about cessation    Leukocytosis Thrombocytosis: Monitor   DVT prophylaxis: SCD Code Status: Full code Family Communication: None present at bedside.  Plan of care discussed with patient in length and he verbalized understanding and agreed with it. Disposition Plan: To be determined Consults called: Cardiology, GI consulted Admission status: Inpatient    Subjective:  Patient seen and examined at the bedside.  He has been having continuous abdominal pain, receiving IV Dilaudid , oxy.  Tearful.  Objective: Vitals:   03/31/24 0840 03/31/24 1239 03/31/24 1250 03/31/24 1609  BP:   (!) 156/120 (!) 174/97  Pulse:  99  84  Resp:  18 (!) 21 18  Temp:  98.3 F (36.8 C)  98.8 F (37.1 C)  TempSrc:  Oral  Oral  SpO2: 93% 95% 93% 92%  Weight:      Height:        Intake/Output Summary (Last 24 hours) at 03/31/2024 1622 Last data filed at 03/30/2024 2300 Gross per 24 hour  Intake 600 ml  Output --  Net 600 ml   Filed Weights   03/29/24 1943  Weight: 57.4 kg    Examination:  General: Anxious: Not in any acute distress Chest: Clear CVS: S1, S2, no murmur, regular rhythm Abdomen: Soft, diffuse tenderness, (pt doesn't allow to touch) Extremities: No edema   Data Reviewed: I have personally reviewed following labs and imaging studies  CBC: Recent Labs  Lab 03/29/24 1350 03/29/24 1355 03/30/24 0231 03/31/24 0241  WBC 14.5*  --  14.6* 8.9  NEUTROABS 9.3*  --   --  3.6  HGB 16.5* 16.7* 14.7 13.5  HCT 49.1* 49.0* 44.8 40.0  MCV 90.1  --  92.4 90.7  PLT 450*  --  328 297   Basic Metabolic Panel: Recent Labs  Lab 03/29/24 1355 03/29/24 1533 03/29/24 2207 03/30/24 0231 03/31/24 0241  NA 136 135  --  132* 131*  K 5.2* 6.5*  --  4.6 4.2  CL 106 104  --  101 100  CO2  --  17*  --  21* 24  GLUCOSE 122* 82  --  76 83  BUN 46* 38*  --  38* 30*  CREATININE 1.80* 1.55*  --  1.52* 1.37*  CALCIUM   --  9.9  --  10.0 9.1  MG  --   --  2.7*  --   --   PHOS  --   --  3.9  --   --     GFR: Estimated Creatinine Clearance: 35.4 mL/min (A) (by C-G formula based on SCr of 1.37 mg/dL (H)). Liver Function Tests: Recent Labs  Lab 03/29/24 1533 03/30/24 0231  AST 46* 34  ALT 16 15  ALKPHOS 101 94  BILITOT 0.4 0.3  PROT 7.9 7.2  ALBUMIN 4.1 3.9   Recent Labs  Lab 03/29/24 1533  LIPASE 43   No results for input(s): AMMONIA in the last 168 hours. Coagulation Profile: No results for input(s): INR, PROTIME in the last 168 hours. Cardiac Enzymes: No results for input(s): CKTOTAL, CKMB, CKMBINDEX, TROPONINI in the last 168 hours. BNP (last 3 results) Recent Labs    02/07/24 1948 03/29/24 1533  PROBNP >35,000.0* >35,000.0*   HbA1C:  No results for input(s): HGBA1C in the last 72 hours. CBG: No results for input(s): GLUCAP in the last 168 hours. Lipid Profile: No results for input(s): CHOL, HDL, LDLCALC, TRIG, CHOLHDL, LDLDIRECT in the last 72 hours. Thyroid  Function Tests: No results for input(s): TSH, T4TOTAL, FREET4, T3FREE, THYROIDAB in the last 72 hours. Anemia Panel: No results for input(s): VITAMINB12, FOLATE, FERRITIN, TIBC, IRON, RETICCTPCT in the last 72 hours. Sepsis Labs: No results for input(s): PROCALCITON, LATICACIDVEN in the last 168 hours.  Recent Results (from the past 240 hours)  Resp panel by RT-PCR (RSV, Flu A&B, Covid) Anterior Nasal Swab     Status: None   Collection Time: 03/29/24  1:48 PM   Specimen: Anterior Nasal Swab  Result Value Ref Range Status   SARS Coronavirus 2 by RT PCR NEGATIVE NEGATIVE Final   Influenza A by PCR NEGATIVE NEGATIVE Final   Influenza B by PCR NEGATIVE NEGATIVE Final    Comment: (NOTE) The Xpert Xpress SARS-CoV-2/FLU/RSV plus assay is intended as an aid in the diagnosis of influenza from Nasopharyngeal swab specimens and should not be used as a sole basis for treatment. Nasal washings and aspirates are unacceptable for Xpert Xpress  SARS-CoV-2/FLU/RSV testing.  Fact Sheet for Patients: bloggercourse.com  Fact Sheet for Healthcare Providers: seriousbroker.it  This test is not yet approved or cleared by the [place]  FDA and has been authorized for detection and/or diagnosis of SARS-CoV-2 by FDA under an Emergency Use Authorization (EUA). This EUA will remain in effect (meaning this test can be used) for the duration of the COVID-19 declaration under Section 564(b)(1) of the Act, 21 U.S.C. section 360bbb-3(b)(1), unless the authorization is terminated or revoked.     Resp Syncytial Virus by PCR NEGATIVE NEGATIVE Final    Comment: (NOTE) Fact Sheet for Patients: bloggercourse.com  Fact Sheet for Healthcare Providers: seriousbroker.it  This test is not yet approved or cleared by the [place]  FDA and has been authorized for detection and/or diagnosis of SARS-CoV-2 by FDA under an Emergency Use Authorization (EUA). This EUA will remain in effect (meaning this test can be used) for the duration of the COVID-19 declaration under Section 564(b)(1) of the Act, 21 U.S.C. section 360bbb-3(b)(1), unless the authorization is terminated or revoked.  Performed at [facility], [address], [place], [place] [address]   MRSA Next Gen by PCR, Nasal     Status: None   Collection Time: 03/29/24 10:57 PM   Specimen: Nasal Mucosa; Nasal Swab  Result Value Ref Range Status   MRSA by PCR Next Gen NOT DETECTED NOT DETECTED Final    Comment: (NOTE) The GeneXpert MRSA Assay (FDA approved for NASAL specimens only), is one component of a comprehensive MRSA colonization surveillance program. It is not intended to diagnose MRSA infection nor to guide or monitor treatment for MRSA infections. Test performance is not FDA approved in patients less than 75 years old. Performed at [facility], [address], [place], [place] [address]          Radiology Studies: NM Hepato W/EF Result Date: 03/31/2024 CLINICAL DATA:  Abdominal pain. EXAM: NUCLEAR MEDICINE HEPATOBILIARY IMAGING WITH GALLBLADDER EF TECHNIQUE: Sequential images of the abdomen were obtained out to 60 minutes following intravenous administration of radiopharmaceutical. After oral ingestion of 8 ounces of Ensure, gallbladder ejection fraction was determined. RADIOPHARMACEUTICALS:  5.2 mCi Tc-27m Choletec  IV COMPARISON:  None Available. FINDINGS: Prompt uptake and biliary excretion of activity by the liver is seen. Gallbladder  activity is visualized, consistent with patency of cystic duct. Biliary activity passes into small bowel, consistent with patent common bile duct. Calculated gallbladder ejection fraction is 77%. (Normal gallbladder ejection fraction with half-and-half is greater than 33% and less than 80%.) IMPRESSION: Normal nuclear medicine hepatobiliary scan with a normal gallbladder ejection fraction of 77%. Electronically Signed   By: Suzen Dials M.D.   On: 03/31/2024 13:25        Scheduled Meds:  amLODipine   10 mg Oral Daily   carvedilol   3.125 mg Oral BID WC   gabapentin   400 mg Oral TID   lidocaine   1 patch Transdermal Q24H   pantoprazole  (PROTONIX ) IV  40 mg Intravenous Q12H   polyethylene glycol  17 g Oral BID   rosuvastatin   20 mg Oral Daily   sucralfate   1 g Oral TID AC & HS   umeclidinium bromide   1 puff Inhalation Daily   Continuous Infusions:        Taleigh Gero, MD [facility] 03/31/2024, 4:22 PM   "

## 2024-03-31 NOTE — Progress Notes (Signed)
 "   Inpatient Progress Note     Patient Profile/Chief Complaint  Carol Henderson is a 58 y.o. female  with history of bipolar disorder, schizophrenia, COPD, prior history of hepatitis C, hypertension, coronary artery disease status post previous MI and drug-eluting stent to the RCA and circumflex 2020 prior CVA, and previous history of vulvar cancer who is seen in inpatient consultation for evaluation and management of acute on chronic epigastric abdominal pain and rectal bleeding.   EGD 02/13/2024 -torturous esophagus, nonbleeding gastric ulcer with clean base, erythematous antral mucosa and ampullary mucosal changes -all pathology benign and no H. pylori  CT angio chest/abdomen 03/29/2024 -no acute aortic syndrome, marked narrowing of the celiac trunk, dilated CBD  [facility] HIDA 03/31/2024 -normal nuclear medicine hepatobiliary scan with gallbladder EF 77%    Interval History   - Continues to complain of epigastric abdominal pain - No bowel movements - Continues to take Dilaudid  for abdominal pain - No further rectal bleeding   Objective   Vital signs in last 24 hours: Temp:  [98.3 F (36.8 C)-98.5 F (36.9 C)] 98.3 F (36.8 C) (12/27 1239) Pulse Rate:  [69-99] 99 (12/27 1239) Resp:  [14-21] 21 (12/27 1250) BP: (135-177)/(88-120) 156/120 (12/27 1250) SpO2:  [92 %-95 %] 93 % (12/27 1250) Last BM Date : 03/26/24 General:    Chronically ill-appearing woman resting in bed, intermittently uncomfortable Heart:  Regular rate and rhythm; no murmurs Lungs: Respirations even and unlabored, lungs CTA bilaterally Abdomen:  Soft, tender to palpation over the right upper quadrant, mid epigastrium normal bowel sounds. Extremities:  Without edema. Neurologic:  Alert and oriented,  grossly normal neurologically. Psych:  Cooperative.  Intermittently anxious  Intake/Output from previous day: 12/26 0701 - 12/27 0700 In: 600 [P.O.:600] Out: 400 [Emesis/NG output:400] Intake/Output this shift: No  intake/output data recorded.  Lab Results: Recent Labs    03/29/24 1350 03/29/24 1355 03/30/24 0231 03/31/24 0241  WBC 14.5*  --  14.6* 8.9  HGB 16.5* 16.7* 14.7 13.5  HCT 49.1* 49.0* 44.8 40.0  PLT 450*  --  328 297   BMET Recent Labs    03/29/24 1533 03/30/24 0231 03/31/24 0241  NA 135 132* 131*  K 6.5* 4.6 4.2  CL 104 101 100  CO2 17* 21* 24  GLUCOSE 82 76 83  BUN 38* 38* 30*  CREATININE 1.55* 1.52* 1.37*  CALCIUM  9.9 10.0 9.1   LFT Recent Labs    03/30/24 0231  PROT 7.2  ALBUMIN 3.9  AST 34  ALT 15  ALKPHOS 94  BILITOT 0.3   PT/INR No results for input(s): LABPROT, INR in the last 72 hours.  Studies/Results: NM Hepato W/EF Result Date: 03/31/2024 CLINICAL DATA:  Abdominal pain. EXAM: NUCLEAR MEDICINE HEPATOBILIARY IMAGING WITH GALLBLADDER EF TECHNIQUE: Sequential images of the abdomen were obtained out to 60 minutes following intravenous administration of radiopharmaceutical. After oral ingestion of 8 ounces of Ensure, gallbladder ejection fraction was determined. RADIOPHARMACEUTICALS:  5.2 mCi Tc-73m Choletec  IV COMPARISON:  None Available. FINDINGS: Prompt uptake and biliary excretion of activity by the liver is seen. Gallbladder activity is visualized, consistent with patency of cystic duct. Biliary activity passes into small bowel, consistent with patent common bile duct. Calculated gallbladder ejection fraction is 77%. (Normal gallbladder ejection fraction with half-and-half is greater than 33% and less than 80%.) IMPRESSION: Normal nuclear medicine hepatobiliary scan with a normal gallbladder ejection fraction of 77%. Electronically Signed   By: Suzen Dials M.D.   On: 03/31/2024 13:25  CT Angio Chest/Abd/Pel for Dissection W and/or Wo Contrast Result Date: 03/29/2024 CLINICAL DATA:  Chest pain, abdominal pain. Acute aortic syndrome suspected. EXAM: CT ANGIOGRAPHY CHEST, ABDOMEN AND PELVIS TECHNIQUE: Non-contrast CT of the chest was initially  obtained. Multidetector CT imaging through the chest, abdomen and pelvis was performed using the standard protocol during bolus administration of intravenous contrast. Multiplanar reconstructed images and MIPs were obtained and reviewed to evaluate the vascular anatomy. RADIATION DOSE REDUCTION: This exam was performed according to the departmental dose-optimization program which includes automated exposure control, adjustment of the mA and/or kV according to patient size and/or use of iterative reconstruction technique. CONTRAST:  70mL OMNIPAQUE  IOHEXOL  350 MG/ML SOLN COMPARISON:  CT abdomen pelvis 02/08/2024 CT chest 04/24/2018. FINDINGS: CTA CHEST FINDINGS Cardiovascular: No intramural hematoma on precontrast imaging. No aortic aneurysm or dissection on postcontrast imaging. Atherosclerotic calcification of the aorta with age advanced involvement of all 3 coronary arteries. Left ventricular hypertrophy. Heart is at the upper limits of normal in size. No pericardial effusion. Mediastinum/Nodes: Thoracic inlet lymph nodes are not enlarged by CT size criteria. No pathologically enlarged mediastinal, hilar or axillary lymph nodes. Esophagus is grossly unremarkable. Lungs/Pleura: Centrilobular and paraseptal emphysema. Smoking related respiratory bronchiolitis. Mild dependent atelectasis bilaterally. No pleural fluid. Minimal debris in the airway. Musculoskeletal: Degenerative changes in the spine. Possible old sternal fracture. Review of the MIP images confirms the above findings. CTA ABDOMEN AND PELVIS FINDINGS VASCULAR Aorta: No aneurysm or dissection.  Atherosclerotic calcification. Celiac: Marked narrowing of the celiac trunk. SMA: Widely patent.  Atherosclerotic calcification. Renals: Widely patent.  Single bilaterally. IMA: Patent. Inflow: Atherosclerotic calcification.  No dissection or aneurysm. Veins: Poorly evaluated due to lack of opacification. Review of the MIP images confirms the above findings.  NON-VASCULAR Hepatobiliary: Liver and gallbladder are unremarkable. Common bile duct measures up to 10 mm (coronal image 50 series 10). Pancreas: Negative. Spleen: Negative. Adrenals/Urinary Tract: Adrenal glands are unremarkable. Renal cortical thinning and scarring bilaterally. Ureters are decompressed. Bladder may be minimally thick-walled. Stomach/Bowel: Stomach, small bowel, appendix and colon are unremarkable. Lymphatic: No pathologically enlarged lymph nodes. Reproductive: Uterus is visualized.  No adnexal mass. Other: No free fluid.  Mesenteries and peritoneum are unremarkable. Musculoskeletal: Osteopenia. Old sacral insufficiency fractures. Degenerative changes in the spine. Very minimal grade 1 anterolisthesis of L5 on S1 with bilateral L5 pars defects. Review of the MIP images confirms the above findings. IMPRESSION: 1. No evidence of acute aortic syndrome. 2. Marked narrowing of the celiac trunk. 3.  Age advanced three-vessel coronary artery calcification. 4. Left ventricular hypertrophy. 5. Dilated common bile duct. Please correlate clinically for right upper quadrant symptoms and consider nonemergent outpatient MR abdomen/MRCP with and without contrast, as clinically indicated. 6.  Aortic atherosclerosis (ICD10-I70.0). 7. Emphysema (ICD10-J43.9). Low-dose CT lung cancer screening is recommended for patients who are 34-55 years of age with a 20+ pack-year history of smoking and who are currently smoking or quit <=15 years ago. Electronically Signed   By: Newell Eke M.D.   On: 03/29/2024 16:50    Endoscopic Studies: EGD 02/13/2024 -torturous esophagus, nonbleeding gastric ulcer with clean base, erythematous antral mucosa and ampullary mucosal changes -all pathology benign and no H. pylori   Clinical Impression   Carol Henderson is a 58 y.o. female  with history of bipolar disorder, schizophrenia, COPD, prior history of hepatitis C, hypertension, coronary artery disease status post previous MI  and drug-eluting stent to the RCA and circumflex 2020 prior CVA, and previous history of vulvar  cancer who is seen in inpatient consultation for evaluation and management of acute on chronic epigastric abdominal pain and rectal bleeding.   She was recently admitted 02/2024 for chest pain, epigastric pain and rectal bleeding.  Cardiac cath negative at that time.  Diagnosed with nonbleeding gastric ulcer and rectal bleeding attributed to hemorrhoids.  Reports being compliant with Protonix , Carafate  and NSAID avoidance.   Readmitted now with a similar constellation of symptoms.  ACS ruled out.  Her epigastric pain and current presentation is consistent with acute on chronic epigastric pain.  I agree with the sentiments of my colleagues who consulted on her last month that her pain is out of proportion to her gastric ulcer.  Mesenteric ischemia has been ruled out with CTA.  There was notation of marked narrowing of the celiac trunk -query possibility of median arcuate ligament syndrome.  CTA showed CBD dilation doubt choledocholithiasis, however,hepatic function panel is normal.  Low likelihood of a biliary etiology.  HIDA scan today with [facility] was negative for biliary dyskinesia.  She has not had a bowel movement and pain could be related to constipation.  Discussed initiating bowel regimen.  In the absence of other findings discussed the possibility of abdominal migraine with the patient.  She is currently on gabapentin .  Reviewed other medication options including amitriptyline , nortriptyline, Topamax and Lyrica.   In terms of her rectal bleeding, this appears to be consistent with hemorrhoids based upon her history and my rectal examination at the time of initial consultation.  Her hemoglobin is stable.  There is no blood in the rectal vault.  I do not have concern for significant GI bleeding at this time.   Plan  Treat constipation-MiraLAX  17 g p.o. twice daily and initiate senna tablet today Continue  pantoprazole  40 mg IV twice daily Continue Carafate  1 g p.o. 4 times daily Start lidocaine  patch 5% apply to epigastrium Consider trial of Bentyl  20 milligrams p.o. 4 times daily Consider vascular surgery consult for review of CTA for an opinion as to whether or not marked narrowing of the celiac trunk could be indicative of median arcuate ligament syndrome causing pain Discussed possibility of abdominal migraine pending outcome of workup.  Could consider addition of other agents such as tricyclic's, Lyrica or Topamax. Repeat EGD seems low yield at this time. No significant GI bleeding, bleeding reported seems to be hemorrhoidal in nature.  Orders placed for hydrocortisone  suppository 25 mg per rectum twice daily as needed for bleeding.    LOS: 2 days   Carol CHRISTELLA Hausen  03/31/2024, 3:32 PM  Carol Hausen, MD [facility]  "

## 2024-04-01 DIAGNOSIS — K5900 Constipation, unspecified: Secondary | ICD-10-CM

## 2024-04-01 DIAGNOSIS — G8929 Other chronic pain: Secondary | ICD-10-CM | POA: Diagnosis not present

## 2024-04-01 DIAGNOSIS — R1013 Epigastric pain: Secondary | ICD-10-CM | POA: Diagnosis not present

## 2024-04-01 DIAGNOSIS — K625 Hemorrhage of anus and rectum: Secondary | ICD-10-CM | POA: Diagnosis not present

## 2024-04-01 LAB — BASIC METABOLIC PANEL WITH GFR
Anion gap: 8 (ref 5–15)
BUN: 27 mg/dL — ABNORMAL HIGH (ref 6–20)
CO2: 25 mmol/L (ref 22–32)
Calcium: 9 mg/dL (ref 8.9–10.3)
Chloride: 102 mmol/L (ref 98–111)
Creatinine, Ser: 1.44 mg/dL — ABNORMAL HIGH (ref 0.44–1.00)
GFR, Estimated: 42 mL/min — ABNORMAL LOW
Glucose, Bld: 87 mg/dL (ref 70–99)
Potassium: 4.2 mmol/L (ref 3.5–5.1)
Sodium: 134 mmol/L — ABNORMAL LOW (ref 135–145)

## 2024-04-01 LAB — HEMOGLOBIN AND HEMATOCRIT, BLOOD
HCT: 38.5 % (ref 36.0–46.0)
Hemoglobin: 13 g/dL (ref 12.0–15.0)

## 2024-04-01 NOTE — Progress Notes (Signed)
 " PROGRESS NOTE    Carol Henderson  FMW:[id number] DOB: 1965/09/28 DOA: 03/29/2024 PCP: [name], MD   Brief Narrative:    Carol Henderson is a 58 y.o. female with medical history significant of CAD status post PCI, tobacco abuse, COPD, bipolar disorder, hypertension, hyperlipidemia, schizophrenia, multiple personality disorder, marijuana use presented with chest pain, abdominal pain, nausea and vomiting since 3 days. Also reporting bloody stools  Patient recently admitted on 02/07/24 with chest pain and underwent emergent cardiac cath during that admission.  Stable findings with no PCI done. She smoke half a pack of cigarettes per day, uses marijuana every day however denies alcohol abuse.   ED Course: Upon arrival to ED: Patient afebrile, pulse 109, RR: 20, BP 153/119, maintaining oxygen saturation on room air COVID flu RSV negative.  POC occult blood negative.  CBC shows leukocytosis of 14.5, H&H 16.5/49.1, PLT 450.  Ethanol within normal limit.  Lipase: WNL.  UDS positive for marijuana.  UA negative for infection.  BNP more than 35,000.  NA: 135, K: 6.5, CR: 1.55, AST: 46, GFR: 38.  Chest x-ray negative.  CT angio chest abdomen pelvis shows no evidence of acute aortic syndrome.  Marked narrowing of celiac trunk.  Dilated CBD.  Radiology recommend nonemergent MRCP outpatient..  Initial troponin 32 trended up to 118.  Code STEMI called.  Evaluated by cardiology.   Symptoms felt to be GI related/abdominal pain, cardiology signed off.  Assessment & Plan:   Principal Problem:   Chest pain Active Problems:   Coronary artery disease of native artery of native heart with stable angina pectoris   Abdominal pain, chronic, epigastric   Rectal bleeding   Chest pain of uncertain etiology   Constipation  Acute on chronic abdominal pain:  -Evaluated by GI.  CT shows narrowing of the celiac trunk.  GI have consulted vascular regarding this. -Has had multiple workups in the past.  Recent  EGD with mild gastric ulcer with no bleeding. -HIDA scan 12/27 negative -Constipation.  Bowel regimen - Pain likely functional/migraine/vaginitis.  Patient has  anxiety.  Has problem with depressions in the past.  Will start duloxetine .  Will benefit outpatient psychiatric eval/treatment - receiving gabapentin , oxycodone .  IV Dilaudid  discontinued 12/27.  Rectal bleeding: Patient reported multiple episodes of bleeding prior to admission.  Hemoglobin has remained stable.  No plan for EGD/colonoscopy.  No reports of GI bleeding in the hospital. ?  Hemorrhoidal   Chest pain: Elevated troponin: -Troponin elevated and peaked at 136.  Evaluated by cardiology.  Unlikely cardiac etiology of epigastric/chest pain.  Patient not a candidate for IV heparin  because of concern for GI bleeding.  Cardiology has signed off  History of CAD with stenting 2020: Emergent cath November 2025, no culprit lesion identified Medical management advised. Continue statin, Coreg , losartan  I do not see aspirin  in her medication list   HFpEF: - Patient appears euvolemic on exam.  Elevated proBNP.  Chest x-ray negative. - Recent 2D echo shows EF of 40%. - Will resume home medication.  Strict INO's and daily weight   Hypertension: Continue Coreg , amlodipine  and losartan .  Monitor blood pressure closely   COPD In the setting of tobacco abuse.  Currently on room air.  No wheezing noted on exam.  Chest x-ray negative.  Will continue to monitor   History of schizophrenia Bipolar: Will resume home medications   Hyperkalemia: - Was given dextrose , insulin  and Lokelma  in the ED. - Check magnesium level.  Repeat CMP tomorrow.  Dilated CBD: - Outpatient follow-up-need MRCP   CKD stage IIIb: - Received IV fluids in ED.  Monitor closely.  Avoid nephrotoxic medications   Tobacco abuse Marijuana abuse: Counseled about cessation   Leukocytosis Thrombocytosis: Monitor   DVT prophylaxis: SCD Code Status: Full  code Family Communication: None present at bedside.  Plan of care discussed with patient in length and he verbalized understanding and agreed with it. Disposition Plan: To be determined Consults called: Cardiology, GI consulted Admission status: Inpatient    Subjective:  Patient seen and examined at the bedside.  Reports that abdominal pain has gotten better.  Has lidocaine  patch.  Receiving gabapentin , oxycodone  as needed.  Objective: Vitals:   04/01/24 0804 04/01/24 1119 04/01/24 1156 04/01/24 1255  BP:   (!) 178/112 138/83  Pulse:  83    Resp:  18    Temp:  98.1 F (36.7 C)    TempSrc:  Oral    SpO2: 95% 95%    Weight:      Height:        Intake/Output Summary (Last 24 hours) at 04/01/2024 1258 Last data filed at 04/01/2024 1236 Gross per 24 hour  Intake 1200 ml  Output --  Net 1200 ml   Filed Weights   03/29/24 1943  Weight: 57.4 kg    Examination:  General: Anxious: Not in any acute distress Chest: Clear CVS: S1, S2, no murmur, regular rhythm Abdomen: Soft, nontender, normal bowel movements Extremities: No edema   Data Reviewed: I have personally reviewed following labs and imaging studies  CBC: Recent Labs  Lab 03/29/24 1350 03/29/24 1355 03/30/24 0231 03/31/24 0241 04/01/24 0122  WBC 14.5*  --  14.6* 8.9  --   NEUTROABS 9.3*  --   --  3.6  --   HGB 16.5* 16.7* 14.7 13.5 13.0  HCT 49.1* 49.0* 44.8 40.0 38.5  MCV 90.1  --  92.4 90.7  --   PLT 450*  --  328 297  --    Basic Metabolic Panel: Recent Labs  Lab 03/29/24 1355 03/29/24 1533 03/29/24 2207 03/30/24 0231 03/31/24 0241 04/01/24 0122  NA 136 135  --  132* 131* 134*  K 5.2* 6.5*  --  4.6 4.2 4.2  CL 106 104  --  101 100 102  CO2  --  17*  --  21* 24 25  GLUCOSE 122* 82  --  76 83 87  BUN 46* 38*  --  38* 30* 27*  CREATININE 1.80* 1.55*  --  1.52* 1.37* 1.44*  CALCIUM   --  9.9  --  10.0 9.1 9.0  MG  --   --  2.7*  --   --   --   PHOS  --   --  3.9  --   --   --     GFR: Estimated Creatinine Clearance: 33.7 mL/min (A) (by C-G formula based on SCr of 1.44 mg/dL (H)). Liver Function Tests: Recent Labs  Lab 03/29/24 1533 03/30/24 0231  AST 46* 34  ALT 16 15  ALKPHOS 101 94  BILITOT 0.4 0.3  PROT 7.9 7.2  ALBUMIN 4.1 3.9   Recent Labs  Lab 03/29/24 1533  LIPASE 43   No results for input(s): AMMONIA in the last 168 hours. Coagulation Profile: No results for input(s): INR, PROTIME in the last 168 hours. Cardiac Enzymes: No results for input(s): CKTOTAL, CKMB, CKMBINDEX, TROPONINI in the last 168 hours. BNP (last 3 results) Recent Labs    02/07/24 1948  03/29/24 1533  PROBNP >35,000.0* >35,000.0*   HbA1C: No results for input(s): HGBA1C in the last 72 hours. CBG: No results for input(s): GLUCAP in the last 168 hours. Lipid Profile: No results for input(s): CHOL, HDL, LDLCALC, TRIG, CHOLHDL, LDLDIRECT in the last 72 hours. Thyroid  Function Tests: No results for input(s): TSH, T4TOTAL, FREET4, T3FREE, THYROIDAB in the last 72 hours. Anemia Panel: No results for input(s): VITAMINB12, FOLATE, FERRITIN, TIBC, IRON, RETICCTPCT in the last 72 hours. Sepsis Labs: No results for input(s): PROCALCITON, LATICACIDVEN in the last 168 hours.  Recent Results (from the past 240 hours)  Resp panel by RT-PCR (RSV, Flu A&B, Covid) Anterior Nasal Swab     Status: None   Collection Time: 03/29/24  1:48 PM   Specimen: Anterior Nasal Swab  Result Value Ref Range Status   SARS Coronavirus 2 by RT PCR NEGATIVE NEGATIVE Final   Influenza A by PCR NEGATIVE NEGATIVE Final   Influenza B by PCR NEGATIVE NEGATIVE Final    Comment: (NOTE) The Xpert Xpress SARS-CoV-2/FLU/RSV plus assay is intended as an aid in the diagnosis of influenza from Nasopharyngeal swab specimens and should not be used as a sole basis for treatment. Nasal washings and aspirates are unacceptable for Xpert Xpress  SARS-CoV-2/FLU/RSV testing.  Fact Sheet for Patients: bloggercourse.com  Fact Sheet for Healthcare Providers: seriousbroker.it  This test is not yet approved or cleared by the [place]  FDA and has been authorized for detection and/or diagnosis of SARS-CoV-2 by FDA under an Emergency Use Authorization (EUA). This EUA will remain in effect (meaning this test can be used) for the duration of the COVID-19 declaration under Section 564(b)(1) of the Act, 21 U.S.C. section 360bbb-3(b)(1), unless the authorization is terminated or revoked.     Resp Syncytial Virus by PCR NEGATIVE NEGATIVE Final    Comment: (NOTE) Fact Sheet for Patients: bloggercourse.com  Fact Sheet for Healthcare Providers: seriousbroker.it  This test is not yet approved or cleared by the [place]  FDA and has been authorized for detection and/or diagnosis of SARS-CoV-2 by FDA under an Emergency Use Authorization (EUA). This EUA will remain in effect (meaning this test can be used) for the duration of the COVID-19 declaration under Section 564(b)(1) of the Act, 21 U.S.C. section 360bbb-3(b)(1), unless the authorization is terminated or revoked.  Performed at [facility], [address] [place], [place] [address]   MRSA Next Gen by PCR, Nasal     Status: None   Collection Time: 03/29/24 10:57 PM   Specimen: Nasal Mucosa; Nasal Swab  Result Value Ref Range Status   MRSA by PCR Next Gen NOT DETECTED NOT DETECTED Final    Comment: (NOTE) The GeneXpert MRSA Assay (FDA approved for NASAL specimens only), is one component of a comprehensive MRSA colonization surveillance program. It is not intended to diagnose MRSA infection nor to guide or monitor treatment for MRSA infections. Test performance is not FDA approved in patients less than 85 years old. Performed at [facility], [address] [place], [place] [address]          Radiology Studies: NM Hepato W/EF Result Date: 03/31/2024 CLINICAL DATA:  Abdominal pain. EXAM: NUCLEAR MEDICINE HEPATOBILIARY IMAGING WITH GALLBLADDER EF TECHNIQUE: Sequential images of the abdomen were obtained out to 60 minutes following intravenous administration of radiopharmaceutical. After oral ingestion of 8 ounces of Ensure, gallbladder ejection fraction was determined. RADIOPHARMACEUTICALS:  5.2 mCi Tc-41m Choletec  IV COMPARISON:  None Available. FINDINGS: Prompt uptake and biliary  excretion of activity by the liver is seen. Gallbladder activity is visualized, consistent with patency of cystic duct. Biliary activity passes into small bowel, consistent with patent common bile duct. Calculated gallbladder ejection fraction is 77%. (Normal gallbladder ejection fraction with half-and-half is greater than 33% and less than 80%.) IMPRESSION: Normal nuclear medicine hepatobiliary scan with a normal gallbladder ejection fraction of 77%. Electronically Signed   By: Suzen Dials M.D.   On: 03/31/2024 13:25        Scheduled Meds:  amLODipine   10 mg Oral Daily   carvedilol   3.125 mg Oral BID WC   DULoxetine   30 mg Oral Daily   gabapentin   400 mg Oral TID   lidocaine   1 patch Transdermal Q24H   pantoprazole  (PROTONIX ) IV  40 mg Intravenous Q12H   polyethylene glycol  17 g Oral BID   rosuvastatin   20 mg Oral Daily   sucralfate   1 g Oral TID AC & HS   umeclidinium bromide   1 puff Inhalation Daily   Continuous Infusions:        Carol Elzey, MD [facility] 04/01/2024, 12:58 PM   "

## 2024-04-01 NOTE — Progress Notes (Signed)
 Administered hydralazine  10 mg iv for bp=178/112. Rechecked bp= 138/83.  Will continue to monitor pt.   Amado GORMAN Arabia, RN

## 2024-04-01 NOTE — Progress Notes (Addendum)
 "   Inpatient Progress Note     Patient Profile/Chief Complaint  Carol Henderson is a 58 y.o. female  with history of bipolar disorder, schizophrenia, COPD, prior history of hepatitis C, hypertension, coronary artery disease status post previous MI and drug-eluting stent to the RCA and circumflex 2020 prior CVA, and previous history of vulvar cancer who is seen in inpatient consultation for evaluation and management of acute on chronic epigastric abdominal pain and rectal bleeding.   EGD 02/13/2024 -torturous esophagus, nonbleeding gastric ulcer with clean base, erythematous antral mucosa and ampullary mucosal changes -all pathology benign and no H. pylori  CT angio chest/abdomen 03/29/2024 -no acute aortic syndrome, marked narrowing of the celiac trunk, dilated CBD  [facility] HIDA 03/31/2024 -normal nuclear medicine hepatobiliary scan with gallbladder EF 77%    Interval History   - Abdominal pain slightly improved with the addition of lidocaine  patch - Small amount of stool output since starting on MiraLAX  - Has an appetite but sometimes limited by abdominal pain - No further rectal bleeding   Objective   Vital signs in last 24 hours: Temp:  [98 F (36.7 C)-98.8 F (37.1 C)] 98.1 F (36.7 C) (12/28 1119) Pulse Rate:  [75-87] 83 (12/28 1119) Resp:  [16-18] 18 (12/28 1119) BP: (131-178)/(83-112) 138/83 (12/28 1255) SpO2:  [92 %-96 %] 95 % (12/28 1119) Last BM Date : 03/26/24 General:    Chronically ill-appearing woman resting in bed, intermittently uncomfortable Heart:  Regular rate and rhythm; no murmurs Lungs: Respirations even and unlabored, lungs CTA bilaterally Abdomen:  Soft, tender to palpation over the right upper quadrant, mid epigastrium normal bowel sounds. Extremities:  Without edema. Neurologic:  Alert and oriented,  grossly normal neurologically. Psych:  Cooperative.  Intermittently anxious  Intake/Output from previous day: 12/27 0701 - 12/28 0700 In: 240  [P.O.:240] Out: -  Intake/Output this shift: Total I/O In: 960 [P.O.:960] Out: -   Lab Results: Recent Labs    03/30/24 0231 03/31/24 0241 04/01/24 0122  WBC 14.6* 8.9  --   HGB 14.7 13.5 13.0  HCT 44.8 40.0 38.5  PLT 328 297  --    BMET Recent Labs    03/30/24 0231 03/31/24 0241 04/01/24 0122  NA 132* 131* 134*  K 4.6 4.2 4.2  CL 101 100 102  CO2 21* 24 25  GLUCOSE 76 83 87  BUN 38* 30* 27*  CREATININE 1.52* 1.37* 1.44*  CALCIUM  10.0 9.1 9.0   LFT Recent Labs    03/30/24 0231  PROT 7.2  ALBUMIN 3.9  AST 34  ALT 15  ALKPHOS 94  BILITOT 0.3   PT/INR No results for input(s): LABPROT, INR in the last 72 hours.  Studies/Results: NM Hepato W/EF Result Date: 03/31/2024 CLINICAL DATA:  Abdominal pain. EXAM: NUCLEAR MEDICINE HEPATOBILIARY IMAGING WITH GALLBLADDER EF TECHNIQUE: Sequential images of the abdomen were obtained out to 60 minutes following intravenous administration of radiopharmaceutical. After oral ingestion of 8 ounces of Ensure, gallbladder ejection fraction was determined. RADIOPHARMACEUTICALS:  5.2 mCi Tc-74m Choletec  IV COMPARISON:  None Available. FINDINGS: Prompt uptake and biliary excretion of activity by the liver is seen. Gallbladder activity is visualized, consistent with patency of cystic duct. Biliary activity passes into small bowel, consistent with patent common bile duct. Calculated gallbladder ejection fraction is 77%. (Normal gallbladder ejection fraction with half-and-half is greater than 33% and less than 80%.) IMPRESSION: Normal nuclear medicine hepatobiliary scan with a normal gallbladder ejection fraction of 77%. Electronically Signed   By: Suzen  Houston M.D.   On: 03/31/2024 13:25    Endoscopic Studies: EGD 02/13/2024 -torturous esophagus, nonbleeding gastric ulcer with clean base, erythematous antral mucosa and ampullary mucosal changes -all pathology benign and no H. pylori   Clinical Impression   Carol Henderson is a 58  y.o. female  with history of bipolar disorder, schizophrenia, COPD, prior history of hepatitis C, hypertension, coronary artery disease status post previous MI and drug-eluting stent to the RCA and circumflex 2020 prior CVA, and previous history of vulvar cancer who is seen in inpatient consultation for evaluation and management of acute on chronic epigastric abdominal pain and rectal bleeding.   She was recently admitted 02/2024 for chest pain, epigastric pain and rectal bleeding.  Cardiac cath negative at that time.  Diagnosed with nonbleeding gastric ulcer and rectal bleeding attributed to hemorrhoids.  Reports being compliant with Protonix , Carafate  and NSAID avoidance.   Readmitted now with a similar constellation of symptoms.  ACS ruled out.  Her epigastric pain and current presentation is consistent with acute on chronic epigastric pain.  I agree with the sentiments of my colleagues who consulted on her last month that her pain is out of proportion to her gastric ulcer.  Mesenteric ischemia has been ruled out with CTA.  There was notation of marked narrowing of the celiac trunk -query possibility of median arcuate ligament syndrome.  CTA showed CBD dilation doubt choledocholithiasis, however,hepatic function panel is normal.  Low likelihood of a biliary etiology.  HIDA scan today with [facility] was negative for biliary dyskinesia.  She has not had a bowel movement and pain could be related to constipation.  Discussed initiating bowel regimen.  In the absence of other findings discussed the possibility of abdominal migraine with the patient.  She is currently on gabapentin .  Reviewed other medication options including amitriptyline , nortriptyline, Topamax and Lyrica.   In terms of her rectal bleeding, this appears to be consistent with hemorrhoids based upon her history and my rectal examination at the time of initial consultation.  Her hemoglobin is stable.  There is no blood in the rectal vault.  I do  not have concern for significant GI bleeding at this time.   Plan  Treat constipation-MiraLAX  17 g p.o. twice daily and initiate senna tablet.  Adjust dosing depending upon stool output Continue pantoprazole  40 mg IV twice daily Continue Carafate  1 g p.o. 4 times daily Start lidocaine  patch 5% apply to epigastrium Consider trial of Bentyl  20 milligrams p.o. 4 times daily Vascular surgery consult for review of CTA for an opinion as to whether or not marked narrowing of the celiac trunk could be indicative of median arcuate ligament syndrome causing pain Discussed possibility of abdominal migraine pending outcome of workup.  Could consider addition of other agents such as tricyclic's, Lyrica or Topamax. Repeat EGD seems low yield at this time. No significant GI bleeding, bleeding reported seems to be hemorrhoidal in nature.  Orders placed for hydrocortisone  suppository 25 mg per rectum twice daily as needed for bleeding.  Dr. Legrand and Greig Corti, PA will assume rounding responsibilities for [facility] 04/02/2024    LOS: 3 days   Inocente CHRISTELLA Hausen  04/01/2024, 2:25 PM  Inocente Hausen, MD [facility]  "

## 2024-04-02 DIAGNOSIS — R109 Unspecified abdominal pain: Secondary | ICD-10-CM

## 2024-04-02 DIAGNOSIS — R9389 Abnormal findings on diagnostic imaging of other specified body structures: Secondary | ICD-10-CM

## 2024-04-02 DIAGNOSIS — R112 Nausea with vomiting, unspecified: Secondary | ICD-10-CM

## 2024-04-02 DIAGNOSIS — R079 Chest pain, unspecified: Secondary | ICD-10-CM | POA: Diagnosis not present

## 2024-04-02 DIAGNOSIS — R1084 Generalized abdominal pain: Secondary | ICD-10-CM | POA: Diagnosis not present

## 2024-04-02 DIAGNOSIS — K5900 Constipation, unspecified: Secondary | ICD-10-CM | POA: Diagnosis not present

## 2024-04-02 LAB — PRO BRAIN NATRIURETIC PEPTIDE: Pro Brain Natriuretic Peptide: 1314 pg/mL — ABNORMAL HIGH

## 2024-04-02 MED ORDER — BISACODYL 10 MG RE SUPP
10.0000 mg | Freq: Every day | RECTAL | Status: DC
  Administered 2024-04-02: 10 mg via RECTAL
  Filled 2024-04-02: qty 1

## 2024-04-02 MED ORDER — HYDRALAZINE HCL 25 MG PO TABS
25.0000 mg | ORAL_TABLET | Freq: Three times a day (TID) | ORAL | Status: DC
  Administered 2024-04-02 – 2024-04-03 (×2): 25 mg via ORAL
  Filled 2024-04-02 (×2): qty 1

## 2024-04-02 MED ORDER — NICOTINE 21 MG/24HR TD PT24
21.0000 mg | MEDICATED_PATCH | Freq: Every day | TRANSDERMAL | Status: DC
  Administered 2024-04-02 – 2024-04-03 (×2): 21 mg via TRANSDERMAL
  Filled 2024-04-02 (×2): qty 1

## 2024-04-02 MED ORDER — CAPSAICIN 0.075 % EX CREA
TOPICAL_CREAM | Freq: Two times a day (BID) | CUTANEOUS | Status: DC
  Filled 2024-04-02: qty 57

## 2024-04-02 MED ORDER — PANTOPRAZOLE SODIUM 40 MG PO TBEC
40.0000 mg | DELAYED_RELEASE_TABLET | Freq: Every day | ORAL | Status: DC
  Administered 2024-04-03: 40 mg via ORAL
  Filled 2024-04-02: qty 1

## 2024-04-02 NOTE — Care Management Important Message (Signed)
 Important Message  Patient Details  Name: Carol Henderson MRN: [id number] Date of Birth: [birth year]   Important Message Given:  Yes - Medicare IM     Claretta Deed 04/02/2024, 2:25 PM
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
## 2024-04-02 NOTE — Plan of Care (Signed)
  Problem: Education: Goal: Knowledge of General Education information will improve Description: Including pain rating scale, medication(s)/side effects and non-pharmacologic comfort measures Outcome: Progressing   Problem: Activity: Goal: Risk for activity intolerance will decrease Outcome: Progressing   Problem: Elimination: Goal: Will not experience complications related to bowel motility Outcome: Progressing   Problem: Pain Managment: Goal: General experience of comfort will improve and/or be controlled Outcome: Progressing

## 2024-04-02 NOTE — Progress Notes (Addendum)
 Patient ID: Carol Henderson, female   DOB: 1965-05-15, 58 y.o.   MRN: [id number]    Progress Note   Subjective   Day # 4 CC; acute on chronic upper abdominal pain, rectal bleeding  [facility] HIDA scan 03/31/2024 EF 77% CT angio chest abdomen pelvis 03/29/2024 marked narrowing of the celiac trunk, dilated CBD EGD 02/13/2024 tortuous esophagus nonbleeding gastric ulcer with clean base, erythematous antral mucosa and ampullary mucosal changes, no H. Pylori  Vascular surgery has seen in consultation to rule out MAL S-and do not feel that the pattern of stenosis is typical of median arcuate ligament compression and is more consistent with atherosclerotic change.  Recommendation no intervention unless no other source for abdominal pain identified  Patient with tray full of various foods which she says she is not eating, not hungry continues to complain of abdominal pain says nothing is helping.  Asking if she can have surgery. No bowel movement for 5 days Says she smoked weed at home to help with abdominal pain and that was usually effective until recently.    Objective   Vital signs in last 24 hours: Temp:  [98.1 F (36.7 C)-98.4 F (36.9 C)] 98.4 F (36.9 C) (12/29 1106) Pulse Rate:  [76-93] 93 (12/29 1106) Resp:  [18-20] 20 (12/29 1106) BP: (138-178)/(83-129) 145/83 (12/29 1106) SpO2:  [93 %-95 %] 93 % (12/29 0909) Weight:  [59.6 kg] 59.6 kg (12/29 0525) Last BM Date : 03/26/24 General:   Older white female in NAD Heart:  Regular rate and rhythm; no murmurs Lungs: Respirations even and unlabored, lungs CTA bilaterally Abdomen:  Soft, lidocaine  patch in place, tender to very light palpation not focal, no palpable mass or hepatosplenomegaly Extremities:  Without edema. Neurologic:  Alert and oriented,  grossly normal neurologically.  Irritable Psych:  Cooperative. Normal mood and affect.  Intake/Output from previous day: 12/28 0701 - 12/29 0700 In: 2400 [P.O.:2400] Out: -   Intake/Output this shift: No intake/output data recorded.  Lab Results: Recent Labs    03/31/24 0241 04/01/24 0122  WBC 8.9  --   HGB 13.5 13.0  HCT 40.0 38.5  PLT 297  --    BMET Recent Labs    03/31/24 0241 04/01/24 0122  NA 131* 134*  K 4.2 4.2  CL 100 102  CO2 24 25  GLUCOSE 83 87  BUN 30* 27*  CREATININE 1.37* 1.44*  CALCIUM  9.1 9.0   LFT No results for input(s): PROT, ALBUMIN, AST, ALT, ALKPHOS, BILITOT, BILIDIR, IBILI in the last 72 hours. PT/INR No results for input(s): LABPROT, INR in the last 72 hours.  Studies/Results: NM Hepato W/EF Result Date: 03/31/2024 CLINICAL DATA:  Abdominal pain. EXAM: NUCLEAR MEDICINE HEPATOBILIARY IMAGING WITH GALLBLADDER EF TECHNIQUE: Sequential images of the abdomen were obtained out to 60 minutes following intravenous administration of radiopharmaceutical. After oral ingestion of 8 ounces of Ensure, gallbladder ejection fraction was determined. RADIOPHARMACEUTICALS:  5.2 mCi Tc-45m Choletec  IV COMPARISON:  None Available. FINDINGS: Prompt uptake and biliary excretion of activity by the liver is seen. Gallbladder activity is visualized, consistent with patency of cystic duct. Biliary activity passes into small bowel, consistent with patent common bile duct. Calculated gallbladder ejection fraction is 77%. (Normal gallbladder ejection fraction with half-and-half is greater than 33% and less than 80%.) IMPRESSION: Normal nuclear medicine hepatobiliary scan with a normal gallbladder ejection fraction of 77%. Electronically Signed   By: Suzen Dials M.D.   On: 03/31/2024 13:25       Assessment /  Plan:    #11 58 year old white female admitted 4 days ago with chest pain and abdominal pain complaints of nausea and vomiting x 3 days.  Patient has history of schizophrenia, multiple personality disorder, bipolar disorder, COPD, coronary artery disease status post PCI, and history of chronic cannabis use.   Workup  thus far as outlined above unrevealing as to cause of pain.  [facility] HIDA scan normal CT angiography does show narrowing of the celiac trunk, marked-vascular surgery has seen and recommendations as outlined, no plan for intervention unless no other causes for her abdominal pain found,, plan outpatient follow-up for mesenteric duplex  EGD last admission with nonbleeding gastric ulcer clean-based and gastritis, H. pylori negative, continues on twice daily PPI and Carafate   #2 constipation-continue twice daily MiraLAX , Dulcolax suppository today, may need to do a bowel purge if no bowel movement .  Also discussed colonoscopy previously and that was deferred to outpatient/[place] but has not been accomplished. May need to do colonoscopy to complete GI workup  Plan; continue current regimen Twice daily MiraLAX , add Dulcolax suppository today Discuss further with Dr. Syble is challenging given her significant psychiatric issues.  She used using the narcotics regularly here, need to wean off.        Principal Problem:   Chest pain Active Problems:   Coronary artery disease of native artery of native heart with stable angina pectoris   Abdominal pain, chronic, epigastric   Rectal bleeding   Chest pain of uncertain etiology   Constipation     LOS: 4 days   Amy Esterwood PA-C 04/02/2024, 11:55 AM  I have taken an interval history, thoroughly reviewed the chart and examined the patient. I agree with the Advanced Practitioner's note, impression and recommendations, and have recorded additional findings, impressions and recommendations below. I performed a substantive portion of this encounter (>50% time spent), including a complete performance of the medical decision making.  My additional thoughts are as follows:  Signout received from Dr. Suzann and extensive chart review performed on this complex patient.  Tenderness to light palpation of the entire abdominal wall/lower chest  wall and flanks.  Her generalized abdominal pain that is constant with no clear triggers does not fit clinically with MAL S despite the imaging findings.  Vascular surgery opinion also noted.  A mesenteric duplex study is recommended (inpatient if available, outpatient if not)  Patient reports he typically has BMs 1-2 times daily prior to this hospitalization, so constipation does not appear to explain this pain.  No source on recent EGD.  Overall clinical picture suggest there could be a functional component to this pain as well.  My recommendation is to obtain a mesenteric duplex if possible, quickly wean this patient off opioids so she does not develop worsened side effects (already has inpatient constipation) or dependency.  She needs an outpatient colonoscopy with her practice at [facility] to evaluate reported chronic rectal bleeding.  Doubtful of colonoscopy would find an explanation for this pain based on the overall clinical picture.  _________________  This consultation required a moderate degree of medical decision making due to the nature and complexity of the acute condition(s) being evaluated as well as the patient's medical comorbidities.  Victory LITTIE Brand III Office:[phone]

## 2024-04-02 NOTE — Progress Notes (Addendum)
 " PROGRESS NOTE    Carol Henderson  FMW:[id number] DOB: 01-23-1966 DOA: 03/29/2024 PCP: [name], MD  Chief Complaint  Patient presents with   Abdominal Pain   Chest Pain    Brief Narrative:   Carol Henderson is Carol Henderson 58 y.o. female with medical history significant of CAD status post PCI, tobacco abuse, COPD, bipolar disorder, hypertension, hyperlipidemia, schizophrenia, multiple personality disorder, marijuana use presented with chest pain, abdominal pain, nausea and vomiting since 3 days.    Patient recently admitted on 02/07/24 with chest pain and underwent emergent cardiac cath during that admission.  No intervention, medical management recommended.   She smoke half Betzayda Braxton pack of cigarettes per day, uses marijuana every day however denies alcohol abuse.   Seen by cards who has now signed off with suspicion that her pain was GI.    Workup ongoing.   Assessment & Plan:   Principal Problem:   Chest pain Active Problems:   Coronary artery disease of native artery of native heart with stable angina pectoris   Abdominal pain, chronic, epigastric   Rectal bleeding   Chest pain of uncertain etiology   Constipation  Acute on chronic abdominal pain: CTA without evidence of acute aortic syndrome.  She has marked narrowing of celiac trunk.  Dilated common bile duct.   Negative hida scan Appreciate vascular evaluation for the narrowing of celiac trunk - per vascular, pattern of stenosis doesn't appear typical of median arcuate ligament compression, but more c/w atherosclerotic change.  Vascular not recommending any intervention unless no other source identified.  Recommending abstinence from tobacco, aspirin , statin (see 12/29 note). Appreciate GI assistance - workup unrevealing at this point.  Recommending mesenteric duplex if possible.  Wean opiates.  Needs outpatient colonoscopy to evaluate chronic rectal bleeding.   Trial capsaicin . Continue gabapentin , continue cymbalta  (uptitrate  outpatient as tolerated).  Will try to wean opiates.     Rectal bleeding: Needs outpatient GI follow up for colonoscopy   Chest pain Elevated troponin History of CAD with stenting 2020 -Troponin elevated and peaked at 136.  Evaluated by cardiology who noted catheterization [facility]ocks few weeks ago showed moderate L main disease, prior stents patent.  Medical therapy recommended.  Chest pain thought to be from abdomen.  Low suspicion for ACS.   Doesn't appear to be on aspirin /antiplatelet despite recommendation for medical therapy? Will investigate  HFmrEF Moderate to Severe Mitral Regurgitation - pro BNP appears significantly elevated - stable from November.  Unclear significance, she doesn't appear grossly overloaded, imaging without comment of anasarca, no free fluid, no pulmonary fluid. - Patient appears euvolemic on exam.  Elevated proBNP.  Chest x-ray negative. - Recent 2D echo shows EF of 40%. - Will resume home medication.  Strict INO's and daily weight   History of schizophrenia Bipolar: Needs outpatient psych follow up Continue trazodone  nightly prn  Ativan  doesn't appear to be Jesson Foskey chronic medicine, will limit this  Hypertension: Continue Coreg , amlodipine  and losartan .  Getting frequent IV hydral, will start PO hydral.  Has clonidine  lasted as Izmael Duross chronic med, will continue to hold this for now.   COPD In the setting of tobacco abuse.  Currently on room air.  No wheezing noted on exam.  Chest x-ray negative.  Will continue to monitor   Hyperkalemia: resolved   Dilated CBD: - Outpatient follow-up-need MRCP   CKD stage IIIb: - Received IV fluids in ED.  Monitor closely.  Avoid nephrotoxic medications   Tobacco abuse Marijuana abuse: Counseled  about cessation   Leukocytosis Thrombocytosis: Monitor    DVT prophylaxis: SCD Code Status: full Family Communication: none - called sister, Carol Henderson 7:32 PM 12/29, no answer.  Disposition:   Status is: Inpatient Remains inpatient  appropriate because: need for continued inpatient care   Consultants:  GI Vascular cardiology  Procedures:  none  Antimicrobials:  Anti-infectives (From admission, onward)    None       Subjective: C/o abdominal pain Asking for more dilaudid   Objective: Vitals:   04/02/24 0750 04/02/24 0909 04/02/24 0918 04/02/24 1106  BP: (!) 172/87  (!) 166/109 (!) 145/83  Pulse: 88  82 93  Resp: 18   20  Temp: 98.3 F (36.8 C)   98.4 F (36.9 C)  TempSrc: Oral   Oral  SpO2: 95% 93%    Weight:      Height:        Intake/Output Summary (Last 24 hours) at 04/02/2024 1428 Last data filed at 04/02/2024 0348 Gross per 24 hour  Intake 1440 ml  Output --  Net 1440 ml   Filed Weights   03/29/24 1943 04/02/24 0525  Weight: 57.4 kg 59.6 kg    Examination:  General exam: Appears calm and comfortable  Respiratory system: unlabored Cardiovascular system: RRR Gastrointestinal system: pain out of proportion to exam  Central nervous system: Alert and oriented. No focal neurological deficits. Extremities: no LEE    Data Reviewed: I have personally reviewed following labs and imaging studies  CBC: Recent Labs  Lab 03/29/24 1350 03/29/24 1355 03/30/24 0231 03/31/24 0241 04/01/24 0122  WBC 14.5*  --  14.6* 8.9  --   NEUTROABS 9.3*  --   --  3.6  --   HGB 16.5* 16.7* 14.7 13.5 13.0  HCT 49.1* 49.0* 44.8 40.0 38.5  MCV 90.1  --  92.4 90.7  --   PLT 450*  --  328 297  --     Basic Metabolic Panel: Recent Labs  Lab 03/29/24 1355 03/29/24 1533 03/29/24 2207 03/30/24 0231 03/31/24 0241 04/01/24 0122  NA 136 135  --  132* 131* 134*  K 5.2* 6.5*  --  4.6 4.2 4.2  CL 106 104  --  101 100 102  CO2  --  17*  --  21* 24 25  GLUCOSE 122* 82  --  76 83 87  BUN 46* 38*  --  38* 30* 27*  CREATININE 1.80* 1.55*  --  1.52* 1.37* 1.44*  CALCIUM   --  9.9  --  10.0 9.1 9.0  MG  --   --  2.7*  --   --   --   PHOS  --   --  3.9  --   --   --     GFR: Estimated Creatinine  Clearance: 33.7 mL/min (Tyrus Wilms) (by C-G formula based on SCr of 1.44 mg/dL (H)).  Liver Function Tests: Recent Labs  Lab 03/29/24 1533 03/30/24 0231  AST 46* 34  ALT 16 15  ALKPHOS 101 94  BILITOT 0.4 0.3  PROT 7.9 7.2  ALBUMIN 4.1 3.9    CBG: No results for input(s): GLUCAP in the last 168 hours.   Recent Results (from the past 240 hours)  Resp panel by RT-PCR (RSV, Flu Nera Haworth&B, Covid) Anterior Nasal Swab     Status: None   Collection Time: 03/29/24  1:48 PM   Specimen: Anterior Nasal Swab  Result Value Ref Range Status   SARS Coronavirus 2 by RT PCR NEGATIVE NEGATIVE Final  Influenza Jada Fass by PCR NEGATIVE NEGATIVE Final   Influenza B by PCR NEGATIVE NEGATIVE Final    Comment: (NOTE) The Xpert Xpress SARS-CoV-2/FLU/RSV plus assay is intended as an aid in the diagnosis of influenza from Nasopharyngeal swab specimens and should not be used as Carol Loftin sole basis for treatment. Nasal washings and aspirates are unacceptable for Xpert Xpress SARS-CoV-2/FLU/RSV testing.  Fact Sheet for Patients: bloggercourse.com  Fact Sheet for Healthcare Providers: seriousbroker.it  This test is not yet approved or cleared by the [place]  FDA and has been authorized for detection and/or diagnosis of SARS-CoV-2 by FDA under an Emergency Use Authorization (EUA). This EUA will remain in effect (meaning this test can be used) for the duration of the COVID-19 declaration under Section 564(b)(1) of the Act, 21 U.S.C. section 360bbb-3(b)(1), unless the authorization is terminated or revoked.     Resp Syncytial Virus by PCR NEGATIVE NEGATIVE Final    Comment: (NOTE) Fact Sheet for Patients: bloggercourse.com  Fact Sheet for Healthcare Providers: seriousbroker.it  This test is not yet approved or cleared by the [place]  FDA and has been authorized for detection and/or diagnosis of SARS-CoV-2  by FDA under an Emergency Use Authorization (EUA). This EUA will remain in effect (meaning this test can be used) for the duration of the COVID-19 declaration under Section 564(b)(1) of the Act, 21 U.S.C. section 360bbb-3(b)(1), unless the authorization is terminated or revoked.  Performed at [facility], [address] [place], [place] [address]   MRSA Next Gen by PCR, Nasal     Status: None   Collection Time: 03/29/24 10:57 PM   Specimen: Nasal Mucosa; Nasal Swab  Result Value Ref Range Status   MRSA by PCR Next Gen NOT DETECTED NOT DETECTED Final    Comment: (NOTE) The GeneXpert MRSA Assay (FDA approved for NASAL specimens only), is one component of [place]y Larsh comprehensive MRSA colonization surveillance program. It is not intended to diagnose MRSA infection nor to guide or monitor treatment for MRSA infections. Test performance is not FDA approved in patients less than 43 years old. Performed at [facility], [address], [place], [place] [address]          Radiology Studies: No results found.      Scheduled Meds:  amLODipine   10 mg Oral Daily   bisacodyl   10 mg Rectal Daily   capsicum   Topical BID   carvedilol   3.125 mg Oral BID WC   DULoxetine   30 mg Oral Daily   gabapentin   400 mg Oral TID   nicotine   21 mg Transdermal Daily   [START ON 04/03/2024] pantoprazole   40 mg Oral Daily   polyethylene glycol  17 g Oral BID   rosuvastatin   20 mg Oral Daily   sucralfate   1 g Oral TID AC & HS   umeclidinium bromide   1 puff Inhalation Daily   Continuous Infusions:   LOS: 4 days    Time spent: over 30 min     Meliton Monte, MD [facility]   To contact the attending provider between 7A-7P or the covering provider during after hours 7P-7A, please log into the web site [email or web address] and access using universal [place] password for that web site. If you do not have the password, please call the hospital operator.  04/02/2024, 2:28 PM    "

## 2024-04-03 ENCOUNTER — Inpatient Hospital Stay (HOSPITAL_COMMUNITY)

## 2024-04-03 ENCOUNTER — Other Ambulatory Visit (HOSPITAL_COMMUNITY): Payer: Self-pay

## 2024-04-03 DIAGNOSIS — R109 Unspecified abdominal pain: Secondary | ICD-10-CM | POA: Diagnosis not present

## 2024-04-03 LAB — PRO BRAIN NATRIURETIC PEPTIDE: Pro Brain Natriuretic Peptide: 1296 pg/mL — ABNORMAL HIGH

## 2024-04-03 LAB — CBC WITH DIFFERENTIAL/PLATELET
Abs Immature Granulocytes: 0.01 K/uL (ref 0.00–0.07)
Basophils Absolute: 0.1 K/uL (ref 0.0–0.1)
Basophils Relative: 1 %
Eosinophils Absolute: 0.6 K/uL — ABNORMAL HIGH (ref 0.0–0.5)
Eosinophils Relative: 9 %
HCT: 40.6 % (ref 36.0–46.0)
Hemoglobin: 13.8 g/dL (ref 12.0–15.0)
Immature Granulocytes: 0 %
Lymphocytes Relative: 34 %
Lymphs Abs: 2.3 K/uL (ref 0.7–4.0)
MCH: 31 pg (ref 26.0–34.0)
MCHC: 34 g/dL (ref 30.0–36.0)
MCV: 91.2 fL (ref 80.0–100.0)
Monocytes Absolute: 0.7 K/uL (ref 0.1–1.0)
Monocytes Relative: 10 %
Neutro Abs: 3.3 K/uL (ref 1.7–7.7)
Neutrophils Relative %: 46 %
Platelets: 310 K/uL (ref 150–400)
RBC: 4.45 MIL/uL (ref 3.87–5.11)
RDW: 13.8 % (ref 11.5–15.5)
WBC: 7 K/uL (ref 4.0–10.5)
nRBC: 0 % (ref 0.0–0.2)

## 2024-04-03 LAB — COMPREHENSIVE METABOLIC PANEL WITH GFR
ALT: 13 U/L (ref 0–44)
AST: 26 U/L (ref 15–41)
Albumin: 3.8 g/dL (ref 3.5–5.0)
Alkaline Phosphatase: 79 U/L (ref 38–126)
Anion gap: 7 (ref 5–15)
BUN: 22 mg/dL — ABNORMAL HIGH (ref 6–20)
CO2: 26 mmol/L (ref 22–32)
Calcium: 9.6 mg/dL (ref 8.9–10.3)
Chloride: 105 mmol/L (ref 98–111)
Creatinine, Ser: 1.16 mg/dL — ABNORMAL HIGH (ref 0.44–1.00)
GFR, Estimated: 54 mL/min — ABNORMAL LOW
Glucose, Bld: 87 mg/dL (ref 70–99)
Potassium: 3.9 mmol/L (ref 3.5–5.1)
Sodium: 139 mmol/L (ref 135–145)
Total Bilirubin: 0.3 mg/dL (ref 0.0–1.2)
Total Protein: 6.8 g/dL (ref 6.5–8.1)

## 2024-04-03 LAB — MAGNESIUM: Magnesium: 2.1 mg/dL (ref 1.7–2.4)

## 2024-04-03 LAB — PHOSPHORUS: Phosphorus: 2.3 mg/dL — ABNORMAL LOW (ref 2.5–4.6)

## 2024-04-03 MED ORDER — AMLODIPINE BESYLATE 10 MG PO TABS
10.0000 mg | ORAL_TABLET | Freq: Every day | ORAL | 0 refills | Status: AC
  Filled 2024-04-03: qty 90, 90d supply, fill #0

## 2024-04-03 MED ORDER — ASPIRIN 81 MG PO TBEC
81.0000 mg | DELAYED_RELEASE_TABLET | Freq: Every day | ORAL | 0 refills | Status: AC
  Filled 2024-04-03: qty 90, 90d supply, fill #0

## 2024-04-03 MED ORDER — LOSARTAN POTASSIUM 100 MG PO TABS
100.0000 mg | ORAL_TABLET | Freq: Every day | ORAL | 3 refills | Status: AC
  Filled 2024-04-03: qty 90, 90d supply, fill #0

## 2024-04-03 MED ORDER — CARVEDILOL 3.125 MG PO TABS
3.1250 mg | ORAL_TABLET | Freq: Two times a day (BID) | ORAL | 0 refills | Status: AC
  Filled 2024-04-03: qty 180, 90d supply, fill #0

## 2024-04-03 MED ORDER — GABAPENTIN 400 MG PO CAPS
400.0000 mg | ORAL_CAPSULE | Freq: Three times a day (TID) | ORAL | 0 refills | Status: AC
  Filled 2024-04-03: qty 90, 30d supply, fill #0

## 2024-04-03 MED ORDER — TRAZODONE HCL 50 MG PO TABS
50.0000 mg | ORAL_TABLET | Freq: Every evening | ORAL | 0 refills | Status: DC | PRN
  Filled 2024-04-03: qty 30, 30d supply, fill #0

## 2024-04-03 MED ORDER — DULOXETINE HCL 30 MG PO CPEP
30.0000 mg | ORAL_CAPSULE | Freq: Every day | ORAL | 0 refills | Status: DC
  Filled 2024-04-03: qty 30, 30d supply, fill #0

## 2024-04-03 MED ORDER — POLYETHYLENE GLYCOL 3350 17 GM/SCOOP PO POWD
17.0000 g | Freq: Two times a day (BID) | ORAL | 0 refills | Status: AC
  Filled 2024-04-03: qty 238, 7d supply, fill #0

## 2024-04-03 MED ORDER — LOSARTAN POTASSIUM 25 MG PO TABS
100.0000 mg | ORAL_TABLET | Freq: Every day | ORAL | Status: DC
  Administered 2024-04-03: 100 mg via ORAL
  Filled 2024-04-03: qty 4

## 2024-04-03 NOTE — Plan of Care (Signed)

## 2024-04-03 NOTE — Evaluation (Signed)
 Physical Therapy Evaluation and Discharge Patient Details Name: Carol Henderson MRN: [id number] DOB: [birth year] Today's Date: 04/03/2024  History of Present Illness  Pt is 58 yo [facility] due to chest pain, abdominal pain, nausea and vomiting for 3 days. No cardiac reason found by cardiology; still needs GI work up. Recent hospitalization on 11/4 with cardiac catheterization. PMH: CAD, COPD, bipolar disorder, HTN, hyperlipidemia, schizophrenia, multiple personality disorder.  Clinical Impression  Pt is currently Mod I for bed mobility, sit to stand, gait and stairs. Pt benefits from rollator for function due to mild gait instabilities and dyspnea with ambulation. Pt reports she has had some falls related to pain in the legs and unable to sit and rest. Pt demonstrates good knowledge of use of brakes on rollator. Extra time spent on education of purse lipped breathing during activities and before she starts activities in order to improve dyspnea related to COPD. Currently pt is presenting at baseline level of functioning and no skilled physical therapy services recommended. Pt will be discharged from skilled physical therapy services at this time; please re-consult if further needs arise.            If plan is discharge home, recommend the following: Other (comment) (as needed)     Equipment Recommendations Rollator (4 wheels)     Functional Status Assessment Patient has not had a recent decline in their functional status     Precautions / Restrictions Precautions Precautions: None Restrictions Weight Bearing Restrictions Per Provider Order: No      Mobility  Bed Mobility Overal bed mobility: Modified Independent             General bed mobility comments: HOB Elevated.    Transfers Overall transfer level: Independent Equipment used: None, Rollator (4 wheels)                    Ambulation/Gait Ambulation/Gait assistance: Modified independent  (Device/Increase time) Gait Distance (Feet): 300 Feet Assistive device: Rollator (4 wheels) Gait Pattern/deviations: Step-through pattern, Decreased stride length Gait velocity: decreased Gait velocity interpretation: 1.31 - 2.62 ft/sec, indicative of limited community ambulator   General Gait Details: initially ambulating short distances without Rollator pt easily gets dyspneic though O2 sats remain above 90% on room air and mildly impaired balance, significantly improved with rollator.  Stairs Stairs: Yes Stairs assistance: Modified independent (Device/Increase time) Stair Management: One rail Left, One rail Right, Forwards, Alternating pattern Number of Stairs: 10 General stair comments: CGA for safety but not needed.      Balance Overall balance assessment: No apparent balance deficits (not formally assessed), Modified Independent       Pertinent Vitals/Pain Pain Assessment Pain Assessment: 0-10 Pain Score: 6  Pain Location: stomach Pain Descriptors / Indicators: Discomfort Pain Intervention(s): Limited activity within patient's tolerance, Monitored during session    Home Living Family/patient expects to be discharged to:: Private residence Living Arrangements: Other relatives (sister) Available Help at Discharge: Family;Available PRN/intermittently Type of Home: House       Alternate Level Stairs-Number of Steps: 16 Home Layout: Two level;Laundry or work area in [employer]: None      Prior Function Prior Level of Function : Independent/Modified Independent             Mobility Comments: normally wall walks,used a rollator before and felt it was very helpful ADLs Comments: normally ind with medication, ADLs but sister helps with finances     Extremity/Trunk Assessment   Upper Extremity  Assessment Upper Extremity Assessment: Overall WFL for tasks assessed    Lower Extremity Assessment Lower Extremity Assessment: Overall WFL for tasks  assessed    Cervical / Trunk Assessment Cervical / Trunk Assessment: Normal  Communication   Communication Communication: No apparent difficulties    Cognition Arousal: Alert Behavior During Therapy: WFL for tasks assessed/performed   PT - Cognitive impairments: No apparent impairments                         Following commands: Intact       Cueing Cueing Techniques: Verbal cues     General Comments General comments (skin integrity, edema, etc.): O2 sats 98% and HR 102 after gait.        Assessment/Plan    PT Assessment Patient does not need any further PT services         PT Goals (Current goals can be found in the Care Plan section)  Acute Rehab PT Goals PT Goal Formulation: All assessment and education complete, DC therapy     AM-PAC PT 6 Clicks Mobility  Outcome Measure Help needed turning from your back to your side while in a flat bed without using bedrails?: None Help needed moving from lying on your back to sitting on the side of a flat bed without using bedrails?: None Help needed moving to and from a bed to a chair (including a wheelchair)?: None Help needed standing up from a chair using your arms (e.g., wheelchair or bedside chair)?: None Help needed to walk in hospital room?: None Help needed climbing 3-5 steps with a railing? : None 6 Click Score: 24    End of Session Equipment Utilized During Treatment: Gait belt Activity Tolerance: Patient tolerated treatment well Patient left: in bed;with call bell/phone within reach Nurse Communication: Mobility status PT Visit Diagnosis: Unsteadiness on feet (R26.81)    Time: 8984-8961 PT Time Calculation (min) (ACUTE ONLY): 23 min   Charges:   PT Evaluation $PT Eval Low Complexity: 1 Low PT Treatments $Therapeutic Activity: 8-22 mins PT General Charges $$ ACUTE PT VISIT: 1 Visit        Dorothyann Maier, DPT, CLT  Acute Rehabilitation Services Office: [phone] (Secure chat  preferred)   Dorothyann VEAR Maier 04/03/2024, 10:45 AM

## 2024-04-03 NOTE — Discharge Summary (Addendum)
 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 Needs GI follow up and outpatient colonoscopy Follow blood pressure outpatient - adjust as indicated - refills of home meds on discharge Needs MRCP for dilated cbd outpatient  Prolonged Qtc, follow outpatient - avoid qt prolonging meds, but consider meds like cymbalta  or trazodone  if improved outpatient with close monitoring  Discharge Diagnoses:  Principal Problem:   Chest pain Active Problems:   Coronary artery disease of native artery of native heart with stable angina pectoris   Abdominal pain, chronic, epigastric   Rectal bleeding   Chest pain of uncertain etiology   Constipation   Discharge Condition: stable  Diet recommendation: heart healthy  Filed Weights   03/29/24 1943 04/02/24 0525  Weight: 57.4 kg 59.6 kg    History of present illness:   Carol Henderson is Carol Henderson 58 y.o. female with medical history significant of CAD status post PCI, tobacco abuse, COPD, bipolar disorder, hypertension, hyperlipidemia, schizophrenia, multiple personality disorder, marijuana use presented with chest pain, abdominal pain, nausea and vomiting since 3 days.  She was recently admitted on 02/07/24 with chest pain and underwent emergent cardiac cath during that admission.  No intervention, medical management recommended.    She smoke half Carol Henderson pack of cigarettes per day, uses marijuana every day however denies alcohol abuse.   She's been admitted for acute on chronic abdominal pain.  She's been seen by cards who has now signed off with suspicion that her pain was GI.  CT showed celiac artery stenosis.  Vascular not recommending  intervention at this time, recommending outpatient follow up.  GI suspects functional component to pain, needs outpatient GI follow up.  See below for additional details  Hospital Course:  Assessment and Plan:  Acute on chronic abdominal pain: CTA without evidence of acute aortic syndrome.  She has marked narrowing of celiac trunk.  Dilated common bile duct.   Negative hida scan Appreciate vascular evaluation for the narrowing of celiac trunk - per vascular, pattern of stenosis doesn't appear typical of median arcuate ligament compression, but more c/w atherosclerotic change.  Vascular not recommending any intervention unless no other source identified.  Recommending abstinence from tobacco, aspirin , statin (see 12/29 note). Appreciate GI assistance - workup unrevealing at this point.  mesenteric duplex with 70-99% stenosis in celiac artery (follow with vascular outpatient).  Wean opiates.  Needs outpatient colonoscopy to evaluate chronic rectal bleeding.  Pain is improved today after Carol Henderson bowel movement, but she doubts that constipation is primary cause of her pain.  Encouraged continued bowel regimen, need for outpatient follow up.  She's comfortable and eager to discharge today, doesn't feel she needs any opiate (which were not recommended at discharge).  Continue gabapentin , will d/c cymbalta  with increased Qtc.   Rectal bleeding: Resolved - BM yesterday without blood Needs outpatient GI follow up for colonoscopy   Chest pain Elevated troponin History of CAD with stenting 2020 -Troponin elevated and peaked at 136.  Evaluated by cardiology who noted catheterization Carol Henderson few weeks ago showed moderate L main disease, prior stents patent.  Medical therapy recommended.  Chest pain thought to be from abdomen.  Low suspicion for ACS.   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  Unclear significance, she doesn't appear grossly overloaded, imaging without comment of anasarca, no free fluid, no pulmonary fluid.  Repeat pro BNP significantly improved without any diuretic.  Again, unclear significance.   - Patient appears euvolemic on exam.  Elevated proBNP.  Chest x-ray negative. - Recent 2D echo shows EF of 40%. - Will resume home medication.  Strict INO's and daily weight   History of schizophrenia Bipolar: Needs outpatient psych follow up Hold trazodone  with increased qtc.    Prolonged Qtc Follow EKG outpatient Caution with QT prolonging meds   Hypertension: Continue Coreg , amlodipine  and losartan . Wasn't getting her home losartan  here, will give refills and resume at discharge. Follow closely outpatient.   COPD In the setting of tobacco abuse.  Currently on room air.  No wheezing noted on exam.  Chest x-ray negative.  Will continue to monitor   Hyperkalemia: resolved   Dilated CBD: - Outpatient follow-up- will need MRCP   CKD stage IIIb: improved   Tobacco abuse Marijuana abuse: Counseled about cessation   Leukocytosis Thrombocytosis: Monitor  Hx Vulvar Ca Follow outpatient      Procedures:  US  Summary:  Mesenteric:  70 to 99% stenosis in the celiac artery.  Elevated velocities noted in the SMA without evidence of significant  stenosis.   Consultations: Vascular GI caradiology  Discharge Exam: Vitals:   04/03/24 0821 04/03/24 1224  BP: (!) 136/118 119/74  Pulse: (!) 105 89  Resp: 17 18  Temp: 98.6 F (37 C) 98.4 F (36.9 C)  SpO2: 96% 93%   In good spirits today Had Carol Henderson bowel movement yesterday with improvement in her abdominal pain Discussed with sister over the phone when in the room - discussed d/c plan - need for outpatient follow up  General: No acute distress. Cardiovascular: RRR Lungs: unlabored Abdomen: Soft, nontender, nondistended Neurological: Alert and oriented 3. Moves all extremities 4 with  equal strength. Cranial nerves II through XII grossly intact. Extremities: No clubbing or cyanosis. No edema  Discharge Instructions   Discharge Instructions     Call MD for:  difficulty breathing, headache or visual disturbances   Complete by: As directed    Call MD for:  extreme fatigue   Complete by: As directed    Call MD for:  hives   Complete by: As directed    Call MD for:  persistant dizziness or light-headedness   Complete by: As directed    Call MD for:  persistant nausea and vomiting   Complete by: As directed    Call MD for:  redness, tenderness, or signs of infection (pain, swelling, redness, odor or green/yellow discharge around incision site)   Complete by: As directed    Call MD for:  severe uncontrolled pain   Complete by: As directed    Call MD for:  temperature >100.4   Complete by: As directed    Discharge instructions   Complete by: As directed    You were seen for chronic abdominal pain.  The workup here showed celiac artery stenosis.  You'll need to follow up with vascular surgery as an outpatient for this.  Your CT also showed Carol Henderson dilated common bile duct.  You'll need an MRCP as an outpatient.    Your abdominal pain improved after you had Carol Henderson bowel movement.  It's important to continue Carol Henderson good bowel regimen.  You may benefit from Carol Henderson  daily fiber supplement and the use of miralax  as needed to ensure Carol Henderson soft bowel movement daily.  The cause of your abdominal pain isn't immediately clear.  You warrant further outpatient follow up with your PCP and other specialists (GI and vascular) to consider the implications of the celiac artery stenosis as Md Smola potential cause vs other potential causes.   You should follow up with your PCP outpatient to discuss annual low dose lung CT cancer screening.   Follow your blood pressure with your PCP as an outpatient.  I've refilled your blood pressure medicine here.  This will be important to manage given your atherosclerotic disease  (plaque buildup).  We considered starting you on cymbalta  and trazodone , but your Qtc is prolonged.  I think we should hold off on this for now.  You can follow up repeat EKG with your PCP as an outpatient to see if these medicines can be started.   Follow with GI outpatient.  You need Carol Henderson colonoscopy for your rectal bleeding.  Return for new, recurrent, or worsening symptoms.  Please ask your PCP to request records from this hospitalization so they know what was done and what the next steps will be.   Increase activity slowly   Complete by: As directed       Allergies as of 04/03/2024       Reactions   Amitriptyline  Other (See Comments)   Agitation and anger        Medication List     STOP taking these medications    cloNIDine  0.2 MG tablet Commonly known as: CATAPRES    indomethacin  50 MG capsule Commonly known as: INDOCIN    methocarbamol  500 MG tablet Commonly known as: ROBAXIN        TAKE these medications    albuterol  108 (90 Base) MCG/ACT inhaler Commonly known as: VENTOLIN  HFA Inhale 2 puffs into the lungs every 6 (six) hours as needed for wheezing or shortness of breath.   amLODipine  10 MG tablet Commonly known as: NORVASC  Take 1 tablet (10 mg total) by mouth daily.   Aspirin  EC Adult Low Dose 81 MG tablet Generic drug: aspirin  EC Take 1 tablet (81 mg total) by mouth daily. Swallow whole.   carvedilol  3.125 MG tablet Commonly known as: COREG  Take 1 tablet (3.125 mg total) by mouth 2 (two) times daily with Skylin Kennerson meal.   gabapentin  400 MG capsule Commonly known as: NEURONTIN  Take 1 capsule (400 mg total) by mouth 3 (three) times daily. Follow with your PCP for refills What changed: additional instructions   losartan  100 MG tablet Commonly known as: COZAAR  Take 1 tablet (100 mg total) by mouth daily. Follow with your PCP for repeat labs within 1 week.  Follow with your PCP for refills. What changed: additional instructions   nitroGLYCERIN  0.4 MG SL  tablet Commonly known as: NITROSTAT  Place 1 tablet (0.4 mg total) under the tongue every 5 (five) minutes as needed for chest pain.   ondansetron  4 MG disintegrating tablet Commonly known as: Zofran  ODT Take 1 tablet (4 mg total) by mouth every 8 (eight) hours as needed for nausea or vomiting.   pantoprazole  40 MG tablet Commonly known as: PROTONIX  Take 1 tablet (40 mg total) by mouth 2 (two) times daily.   polyethylene glycol powder 17 GM/SCOOP powder Commonly known as: GLYCOLAX /MIRALAX  Dissolve as directed and take 17 g by mouth 2 (two) times daily. Titrate so you have Amayiah Gosnell soft bowel movement daily   rosuvastatin  20 MG tablet Commonly known as:  CRESTOR  Take 1 tablet (20 mg total) by mouth daily.   Spiriva  Respimat 2.5 MCG/ACT Aers Generic drug: Tiotropium Bromide  Inhale 2 puffs into the lungs daily.   sucralfate  1 GM/10ML suspension Commonly known as: CARAFATE  Take 10 mLs (1 g total) by mouth 4 (four) times daily -  before meals and at bedtime.               Durable Medical Equipment  (From admission, onward)           Start     Ordered   04/03/24 1051  For home use only DME 4 wheeled rolling walker with seat  Once       Question:  Patient needs Raven Harmes walker to treat with the following condition  Answer:  Weakness   04/03/24 1051           Allergies[1]  Follow-up Information     [name], MD Follow up.   Specialty: Internal Medicine Why: follow up with your PCP within Paulita Licklider week Pt. will call. Jan. 6,2006 @10  AM Contact information: [address] [place] [address] [phone]         Magda Debby SAILOR, MD Follow up.   Specialties: Vascular Surgery, Interventional Cardiology Why: follow up for your celiac artery stenosis with vascular surgery Office will call PT. Contact information: [address] [place] [address] [phone]         Florencio Cara BIRCH, MD Follow up.   Specialties: Cardiology, Internal  Medicine Why: Jan. 7, 2026 @9 :45 AM Contact information: [address] [place] [address] [phone]                  The results of significant diagnostics from this hospitalization (including imaging, microbiology, ancillary and laboratory) are listed below for reference.    Significant Diagnostic Studies: VAS US  MESENTERIC Result Date: 04/03/2024 ABDOMINAL VISCERAL Patient Name:  TAKESHIA WENK  Date of Exam:   04/03/2024 Medical Rec #: [id number]       Accession #:    [id number] Date of Birth: 12-Nov-1965      Patient Gender: F Patient Age:   26 years Exam Location:  [facility] Procedure:      VAS US  MESENTERIC Referring Phys: [id number] Darrielle Pflieger CALDWELL POWELL JR -------------------------------------------------------------------------------- High Risk Factors: Hypertension, hyperlipidemia, current smoker, prior MI,                    coronary artery disease. Comparison Study: No previous exams. CT on 03/29/2024 showed narrowing of celiac                   trunk. Performing Technologist: Ezzie Potters RVT, RDMS  Examination Guidelines: Jovonna Nickell complete evaluation includes B-mode imaging, spectral Doppler, color Doppler, and power Doppler as needed of all accessible portions of each vessel. Bilateral testing is considered an integral part of Prakriti Carignan complete examination. Limited examinations for reoccurring indications may be performed as noted.  Duplex Findings: +----------------------+--------+--------+------+--------+ Mesenteric            PSV cm/sEDV cm/sPlaqueComments +----------------------+--------+--------+------+--------+ Aorta Prox               61      10                  +----------------------+--------+--------+------+--------+ Aorta Mid                62      0                   +----------------------+--------+--------+------+--------+  Celiac Artery Origin    326                          +----------------------+--------+--------+------+--------+ Celiac  Artery Proximal  308      83                  +----------------------+--------+--------+------+--------+ SMA Origin              254      77                  +----------------------+--------+--------+------+--------+ SMA Proximal            321     112                  +----------------------+--------+--------+------+--------+ SMA Mid                 205      50                  +----------------------+--------+--------+------+--------+ SMA Distal              109      18                  +----------------------+--------+--------+------+--------+ CHA                      57      23                  +----------------------+--------+--------+------+--------+ Splenic                  87      26                  +----------------------+--------+--------+------+--------+  Mesenteric Technologist observations: Patient not NPO - had small coffee with cream and sugar between 4-5 this morning.     Summary: Mesenteric: 70 to 99% stenosis in the celiac artery. Elevated velocities noted in the SMA without evidence of significant stenosis.  *See table(s) above for measurements and observations.     Preliminary    NM Hepato W/EF Result Date: 03/31/2024 CLINICAL DATA:  Abdominal pain. EXAM: NUCLEAR MEDICINE HEPATOBILIARY IMAGING WITH GALLBLADDER EF TECHNIQUE: Sequential images of the abdomen were obtained out to 60 minutes following intravenous administration of radiopharmaceutical. After oral ingestion of 8 ounces of Ensure, gallbladder ejection fraction was determined. RADIOPHARMACEUTICALS:  5.2 mCi Tc-41m Choletec  IV COMPARISON:  None Available. FINDINGS: Prompt uptake and biliary excretion of activity by the liver is seen. Gallbladder activity is visualized, consistent with patency of cystic duct. Biliary activity passes into small bowel, consistent with patent common bile duct. Calculated gallbladder ejection fraction is 77%. (Normal gallbladder ejection fraction with half-and-half  is greater than 33% and less than 80%.) IMPRESSION: Normal nuclear medicine hepatobiliary scan with Macguire Holsinger normal gallbladder ejection fraction of 77%. Electronically Signed   By: Suzen Dials M.D.   On: 03/31/2024 13:25   CT Angio Chest/Abd/Pel for Dissection W and/or Wo Contrast Result Date: 03/29/2024 CLINICAL DATA:  Chest pain, abdominal pain. Acute aortic syndrome suspected. EXAM: CT ANGIOGRAPHY CHEST, ABDOMEN AND PELVIS TECHNIQUE: Non-contrast CT of the chest was initially obtained. Multidetector CT imaging through the chest, abdomen and pelvis was performed using the standard protocol during bolus administration of intravenous contrast. Multiplanar reconstructed images and MIPs were obtained and reviewed to evaluate the vascular anatomy. RADIATION DOSE REDUCTION: This exam was performed  according to the departmental dose-optimization program which includes automated exposure control, adjustment of the mA and/or kV according to patient size and/or use of iterative reconstruction technique. CONTRAST:  70mL OMNIPAQUE  IOHEXOL  350 MG/ML SOLN COMPARISON:  CT abdomen pelvis 02/08/2024 CT chest 04/24/2018. FINDINGS: CTA CHEST FINDINGS Cardiovascular: No intramural hematoma on precontrast imaging. No aortic aneurysm or dissection on postcontrast imaging. Atherosclerotic calcification of the aorta with age advanced involvement of all 3 coronary arteries. Left ventricular hypertrophy. Heart is at the upper limits of normal in size. No pericardial effusion. Mediastinum/Nodes: Thoracic inlet lymph nodes are not enlarged by CT size criteria. No pathologically enlarged mediastinal, hilar or axillary lymph nodes. Esophagus is grossly unremarkable. Lungs/Pleura: Centrilobular and paraseptal emphysema. Smoking related respiratory bronchiolitis. Mild dependent atelectasis bilaterally. No pleural fluid. Minimal debris in the airway. Musculoskeletal: Degenerative changes in the spine. Possible old sternal fracture. Review of  the MIP images confirms the above findings. CTA ABDOMEN AND PELVIS FINDINGS VASCULAR Aorta: No aneurysm or dissection.  Atherosclerotic calcification. Celiac: Marked narrowing of the celiac trunk. SMA: Widely patent.  Atherosclerotic calcification. Renals: Widely patent.  Single bilaterally. IMA: Patent. Inflow: Atherosclerotic calcification.  No dissection or aneurysm. Veins: Poorly evaluated due to lack of opacification. Review of the MIP images confirms the above findings. NON-VASCULAR Hepatobiliary: Liver and gallbladder are unremarkable. Common bile duct measures up to 10 mm (coronal image 50 series 10). Pancreas: Negative. Spleen: Negative. Adrenals/Urinary Tract: Adrenal glands are unremarkable. Renal cortical thinning and scarring bilaterally. Ureters are decompressed. Bladder may be minimally thick-walled. Stomach/Bowel: Stomach, small bowel, appendix and colon are unremarkable. Lymphatic: No pathologically enlarged lymph nodes. Reproductive: Uterus is visualized.  No adnexal mass. Other: No free fluid.  Mesenteries and peritoneum are unremarkable. Musculoskeletal: Osteopenia. Old sacral insufficiency fractures. Degenerative changes in the spine. Very minimal grade 1 anterolisthesis of L5 on S1 with bilateral L5 pars defects. Review of the MIP images confirms the above findings. IMPRESSION: 1. No evidence of acute aortic syndrome. 2. Marked narrowing of the celiac trunk. 3.  Age advanced three-vessel coronary artery calcification. 4. Left ventricular hypertrophy. 5. Dilated common bile duct. Please correlate clinically for right upper quadrant symptoms and consider nonemergent outpatient MR abdomen/MRCP with and without contrast, as clinically indicated. 6.  Aortic atherosclerosis (ICD10-I70.0). 7. Emphysema (ICD10-J43.9). Low-dose CT lung cancer screening is recommended for patients who are 59-40 years of age with Naudia Crosley 20+ pack-year history of smoking and who are currently smoking or quit <=15 years ago.  Electronically Signed   By: Newell Eke M.D.   On: 03/29/2024 16:50   DG Chest Portable 1 View Result Date: 03/29/2024 CLINICAL DATA:  Cough with fever and chest pain. EXAM: PORTABLE CHEST 1 VIEW COMPARISON:  February 07, 2024 FINDINGS: The heart size and mediastinal contours are within normal limits. Both lungs are clear. The visualized skeletal structures are unremarkable. IMPRESSION: No active disease. Electronically Signed   By: Suzen Dials M.D.   On: 03/29/2024 14:09    Microbiology: Recent Results (from the past 240 hours)  Resp panel by RT-PCR (RSV, Flu Kellar Westberg&B, Covid) Anterior Nasal Swab     Status: None   Collection Time: 03/29/24  1:48 PM   Specimen: Anterior Nasal Swab  Result Value Ref Range Status   SARS Coronavirus 2 by RT PCR NEGATIVE NEGATIVE Final   Influenza Eugena Rhue by PCR NEGATIVE NEGATIVE Final   Influenza B by PCR NEGATIVE NEGATIVE Final    Comment: (NOTE) The Xpert Xpress SARS-CoV-2/FLU/RSV plus assay is intended as an aid  in the diagnosis of influenza from Nasopharyngeal swab specimens and should not be used as Tylik Treese sole basis for treatment. Nasal washings and aspirates are unacceptable for Xpert Xpress SARS-CoV-2/FLU/RSV testing.  Fact Sheet for Patients: bloggercourse.com  Fact Sheet for Healthcare Providers: seriousbroker.it  This test is not yet approved or cleared by the [place]  FDA and has been authorized for detection and/or diagnosis of SARS-CoV-2 by FDA under an Emergency Use Authorization (EUA). This EUA will remain in effect (meaning this test can be used) for the duration of the COVID-19 declaration under Section 564(b)(1) of the Act, 21 U.S.C. section 360bbb-3(b)(1), unless the authorization is terminated or revoked.     Resp Syncytial Virus by PCR NEGATIVE NEGATIVE Final    Comment: (NOTE) Fact Sheet for Patients: bloggercourse.com  Fact Sheet for Healthcare  Providers: seriousbroker.it  This test is not yet approved or cleared by the [place]  FDA and has been authorized for detection and/or diagnosis of SARS-CoV-2 by FDA under an Emergency Use Authorization (EUA). This EUA will remain in effect (meaning this test can be used) for the duration of the COVID-19 declaration under Section 564(b)(1) of the Act, 21 U.S.C. section 360bbb-3(b)(1), unless the authorization is terminated or revoked.  Performed at [facility], [address] [place], [place] [address]   MRSA Next Gen by PCR, Nasal     Status: None   Collection Time: 03/29/24 10:57 PM   Specimen: Nasal Mucosa; Nasal Swab  Result Value Ref Range Status   MRSA by PCR Next Gen NOT DETECTED NOT DETECTED Final    Comment: (NOTE) The GeneXpert MRSA Assay (FDA approved for NASAL specimens only), is one component of Jalaysia Lobb comprehensive MRSA colonization surveillance program. It is not intended to diagnose MRSA infection nor to guide or monitor treatment for MRSA infections. Test performance is not FDA approved in patients less than 44 years old. Performed at [facility], [address], [place], [place] [address]      Labs: Basic Metabolic Panel: Recent Labs  Lab 03/29/24 1533 03/29/24 2207 03/30/24 0231 03/31/24 0241 04/01/24 0122 04/03/24 0637  NA 135  --  132* 131* 134* 139  K 6.5*  --  4.6 4.2 4.2 3.9  CL 104  --  101 100 102 105  CO2 17*  --  21* 24 25 26   GLUCOSE 82  --  76 83 87 87  BUN 38*  --  38* 30* 27* 22*  CREATININE 1.55*  --  1.52* 1.37* 1.44* 1.16*  CALCIUM  9.9  --  10.0 9.1 9.0 9.6  MG  --  2.7*  --   --   --  2.1  PHOS  --  3.9  --   --   --  2.3*   Liver Function Tests: Recent Labs  Lab 03/29/24 1533 03/30/24 0231 04/03/24 0637  AST 46* 34 26  ALT 16 15 13   ALKPHOS 101 94 79  BILITOT 0.4 0.3 0.3  PROT 7.9 7.2 6.8  ALBUMIN 4.1 3.9 3.8   Recent Labs  Lab 03/29/24 1533  LIPASE 43   No results for  input(s): AMMONIA in the last 168 hours. CBC: Recent Labs  Lab 03/29/24 1350 03/29/24 1355 03/30/24 0231 03/31/24 0241 04/01/24 0122 04/03/24 0637  WBC 14.5*  --  14.6* 8.9  --  7.0  NEUTROABS 9.3*  --   --  3.6  --  3.3  HGB 16.5* 16.7* 14.7 13.5 13.0 13.8  HCT 49.1* 49.0* 44.8  40.0 38.5 40.6  MCV 90.1  --  92.4 90.7  --  91.2  PLT 450*  --  328 297  --  310   Cardiac Enzymes: No results for input(s): CKTOTAL, CKMB, CKMBINDEX, TROPONINI in the last 168 hours. BNP: BNP (last 3 results) No results for input(s): BNP in the last 8760 hours.  ProBNP (last 3 results) Recent Labs    03/29/24 1533 04/02/24 1810 04/03/24 0637  PROBNP >35,000.0* 1,314.0* 1,296.0*    CBG: No results for input(s): GLUCAP in the last 168 hours.     Signed:  Meliton Monte MD.  [facility] 04/03/2024, 2:35 PM       [1]  Allergies Allergen Reactions   Amitriptyline  Other (See Comments)    Agitation and anger

## 2024-04-03 NOTE — TOC Progression Note (Signed)
 Transition of Care (TOC) - Progression Note   Spoke to patient via phone. Confirmed she wants [place] no pereference on agency. Mitch with [employer] will have same delivered to room.   Patient currently active with a PCP but wants one in [place].   NCM offered to have CMA find a PCP , or patient can call her insurance or visit website and have a complete list of providers in network with her insurance.   Patient voiced understanding. Patient already knows of a practice she wants to establish care with . She or her sister will call to see if that practice is accepting new patient's with her insurance.  Patient Details  Name: Carol Henderson MRN: [id number] Date of Birth: 1965/06/06  Transition of Care [facility]) CM/SW Contact  Floyd Lusignan, Powell Jansky, RN Phone Number: 04/03/2024, 1:07 PM  Clinical Narrative:       Expected Discharge Plan: Home/Self Care Barriers to Discharge: Continued Medical Work up               Expected Discharge Plan and Services   Discharge Planning Services: CM Consult   Living arrangements for the past 2 months: Single Family Home Expected Discharge Date: 04/03/24                                     Social Drivers of Health (SDOH) Interventions SDOH Screenings   Food Insecurity: No Food Insecurity (03/29/2024)  Housing: High Risk (03/29/2024)  Transportation Needs: No Transportation Needs (03/29/2024)  Utilities: Not At Risk (03/29/2024)  Depression (PHQ2-9): High Risk (10/21/2023)  Financial Resource Strain: Low Risk  (11/08/2023)   [facility] from [facility]  Tobacco Use: High Risk (03/29/2024)    Readmission Risk Interventions    02/09/2024   11:38 AM  Readmission Risk Prevention Plan  Transportation Screening Complete  HRI or Home Care Consult Complete  Social Work Consult for Recovery Care Planning/Counseling Complete  Palliative Care Screening Not Applicable  Medication Review Oceanographer) Referral to  Pharmacy

## 2024-04-04 ENCOUNTER — Telehealth: Payer: Self-pay | Admitting: *Deleted

## 2024-04-04 NOTE — Transitions of Care (Post Inpatient/ED Visit) (Signed)
" ° °  04/04/2024  Name: Carol Henderson MRN: [id number] DOB: [birth year]  Today's TOC FU Call Status: Today's TOC FU Call Status:: Unsuccessful Call (1st Attempt) Unsuccessful Call (1st Attempt) Date: 04/04/24  Attempted to reach the patient regarding the most recent Inpatient/ED visit.  Follow Up Plan: Additional outreach attempts will be made to reach the patient to complete the Transitions of Care (Post Inpatient/ED visit) call.  Cathlean Headland BSN RN [facility] Health Care Management Coordinator [email or web address] Direct Dial: [phone]  Fax: [phone] Website: [email or web address]  "

## 2024-04-10 ENCOUNTER — Ambulatory Visit: Admitting: Cardiology

## 2024-04-10 ENCOUNTER — Encounter: Payer: Self-pay | Admitting: Cardiology

## 2024-04-10 ENCOUNTER — Emergency Department (HOSPITAL_COMMUNITY)
Admission: EM | Admit: 2024-04-10 | Discharge: 2024-04-10 | Discharge status: Against Medical Advice | Attending: Emergency Medicine | Admitting: Emergency Medicine

## 2024-04-10 VITALS — BP 112/72 | HR 81 | Ht 62.0 in | Wt 142.2 lb

## 2024-04-10 DIAGNOSIS — K921 Melena: Secondary | ICD-10-CM | POA: Diagnosis not present

## 2024-04-10 DIAGNOSIS — R1013 Epigastric pain: Secondary | ICD-10-CM

## 2024-04-10 DIAGNOSIS — I10 Essential (primary) hypertension: Secondary | ICD-10-CM | POA: Diagnosis not present

## 2024-04-10 DIAGNOSIS — R079 Chest pain, unspecified: Secondary | ICD-10-CM | POA: Insufficient documentation

## 2024-04-10 DIAGNOSIS — K625 Hemorrhage of anus and rectum: Secondary | ICD-10-CM

## 2024-04-10 DIAGNOSIS — Z5321 Procedure and treatment not carried out due to patient leaving prior to being seen by health care provider: Secondary | ICD-10-CM | POA: Insufficient documentation

## 2024-04-10 DIAGNOSIS — K922 Gastrointestinal hemorrhage, unspecified: Secondary | ICD-10-CM | POA: Diagnosis not present

## 2024-04-10 NOTE — Progress Notes (Signed)
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 [facility], [address], [place], [place] [address]  Creatinine, serum     Status: Abnormal   Collection Time: 02/07/24 11:09 PM  Result Value Ref Range   Creatinine, Ser 1.48 (H) 0.44 - 1.00 mg/dL   GFR, Estimated 41 (L) >60 mL/min    Comment: (NOTE) Calculated using the CKD-EPI Creatinine Equation (2021) Performed at [facility], [address], [place], [place] [address]   Lipoprotein A (LPA)     Status: None   Collection Time: 02/08/24  3:16 AM  Result Value Ref  Range   Lipoprotein (a) 13.6 <75.0 nmol/L    Comment: (NOTE) This test was developed and its performance characteristics determined by [facility]. It has not been cleared or approved by the Food and Drug Administration. Note:  Values greater than or equal to 75.0 nmol/L may       indicate an independent risk factor for CHD,       but must be evaluated with caution when applied  to non-Caucasian populations due to the       influence of genetic factors on Lp(a) across       ethnicities. Performed At: [facility] [address] [place], [place] [id number] Jennette Shorter MD Ey:[id number]   CBC     Status: Abnormal   Collection Time: 02/08/24  3:16 AM  Result Value Ref Range   WBC 6.2 4.0 - 10.5 K/uL   RBC 5.13 (H) 3.87 - 5.11 MIL/uL   Hemoglobin 15.3 (H) 12.0 - 15.0 g/dL   HCT 53.7 (H) 63.9 - 53.9 %   MCV 90.1 80.0 - 100.0 fL   MCH 29.8 26.0 - 34.0 pg   MCHC 33.1 30.0 - 36.0 g/dL   RDW 86.7 88.4 - 84.4 %   Platelets 403 (H) 150 - 400 K/uL   nRBC 0.0 0.0 - 0.2 %    Comment: Performed at [facility], [address], [place], [place] [address]  Basic metabolic panel     Status: Abnormal   Collection Time: 02/08/24  3:16 AM  Result Value Ref Range   Sodium 136 135 - 145 mmol/L   Potassium 4.1 3.5 - 5.1 mmol/L   Chloride 101 98 - 111 mmol/L   CO2 21 (L) 22 - 32 mmol/L   Glucose, Bld 148 (H) 70 - 99 mg/dL    Comment: Glucose reference range applies only to samples taken after fasting for at least 8 hours.   BUN 35 (H) 6 - 20 mg/dL   Creatinine, Ser 8.44 (H) 0.44 - 1.00 mg/dL   Calcium  9.1 8.9 - 10.3 mg/dL   GFR, Estimated 39 (L) >60 mL/min    Comment: (NOTE) Calculated using the CKD-EPI Creatinine Equation (2021)    Anion gap 14 5 - 15    Comment: Performed at [facility], [address] [place], [place] [address]  Lipid panel     Status: Abnormal   Collection Time: 02/08/24  3:16 AM  Result Value Ref Range   Cholesterol 217 (H) 0 - 200 mg/dL    Triglycerides 858 <849 mg/dL   HDL 29 (L) >59 mg/dL   Total CHOL/HDL Ratio 7.5 RATIO   VLDL 28 0 - 40 mg/dL   LDL Cholesterol 839 (H) 0 - 99 mg/dL    Comment:        Total Cholesterol/HDL:CHD Risk Coronary Heart Disease Risk Table                     Men   Women  1/2 Average Risk   3.4   3.3  Average Risk       5.0   4.4  2 X Average Risk   9.6   7.1  3 X Average Risk  23.4   11.0        Use the calculated Patient Ratio above and the CHD Risk Table to determine the patient's CHD Risk.        ATP III CLASSIFICATION (LDL):  <100     mg/dL   Optimal  899-870  mg/dL   Near or Above                    Optimal  130-159  mg/dL   Borderline  839-810  mg/dL   High  >809     mg/dL   Very High Performed at [facility], [address], [place], [place] [address]   ECHOCARDIOGRAM COMPLETE     Status:  None   Collection Time: 02/08/24  9:31 AM  Result Value Ref Range   Weight 2,169.33 oz   Height 62.5 in   BP 142/112 mmHg   S' Lateral 3.50 cm   Area-P 1/2 3.16 cm2   Est EF 40 - 45%   Basic metabolic panel with GFR     Status: Abnormal   Collection Time: 02/09/24  4:39 AM  Result Value Ref Range   Sodium 132 (L) 135 - 145 mmol/L   Potassium 3.9 3.5 - 5.1 mmol/L   Chloride 100 98 - 111 mmol/L   CO2 21 (L) 22 - 32 mmol/L   Glucose, Bld 92 70 - 99 mg/dL    Comment: Glucose reference range applies only to samples taken after fasting for at least 8 hours.   BUN 45 (H) 6 - 20 mg/dL   Creatinine, Ser 8.63 (H) 0.44 - 1.00 mg/dL   Calcium  8.7 (L) 8.9 - 10.3 mg/dL   GFR, Estimated 45 (L) >60 mL/min    Comment: (NOTE) Calculated using the CKD-EPI Creatinine Equation (2021)    Anion gap 11 5 - 15    Comment: Performed at [facility], [address], [place], [place] [address]  CBC     Status: Abnormal   Collection Time: 02/09/24  4:39 AM  Result Value Ref Range   WBC 14.1 (H) 4.0 - 10.5 K/uL   RBC 4.76 3.87 - 5.11 MIL/uL   Hemoglobin 14.0 12.0 - 15.0 g/dL   HCT 57.9 63.9  - 53.9 %   MCV 88.2 80.0 - 100.0 fL   MCH 29.4 26.0 - 34.0 pg   MCHC 33.3 30.0 - 36.0 g/dL   RDW 86.7 88.4 - 84.4 %   Platelets 363 150 - 400 K/uL   nRBC 0.0 0.0 - 0.2 %    Comment: Performed at [facility], [address] [place], [place] [address]  Lactic acid, plasma     Status: None   Collection Time: 02/09/24  4:39 AM  Result Value Ref Range   Lactic Acid, Venous 0.7 0.5 - 1.9 mmol/L    Comment: Performed at [facility], [address] [place], [place] [address]  Basic metabolic panel with GFR     Status: Abnormal   Collection Time: 02/10/24  4:05 AM  Result Value Ref Range   Sodium 131 (L) 135 - 145 mmol/L   Potassium 4.0 3.5 - 5.1 mmol/L   Chloride 102 98 - 111 mmol/L   CO2 20 (L) 22 - 32 mmol/L   Glucose, Bld 90 70 - 99 mg/dL    Comment: Glucose reference range applies only to samples taken after fasting for at least 8 hours.   BUN 35 (H) 6 - 20 mg/dL   Creatinine, Ser 8.64 (H) 0.44 - 1.00 mg/dL   Calcium  8.5 (L) 8.9 - 10.3 mg/dL   GFR, Estimated 46 (L) >60 mL/min    Comment: (NOTE) Calculated using the CKD-EPI Creatinine Equation (2021)    Anion gap 9 5 - 15    Comment: Performed at [facility], [address] [place], [place] [address]  CBC     Status: Abnormal   Collection Time: 02/10/24  9:50 AM  Result Value Ref Range   WBC 12.4 (H) 4.0 - 10.5 K/uL   RBC 4.88 3.87 - 5.11 MIL/uL   Hemoglobin 14.4 12.0 - 15.0 g/dL   HCT 57.1 63.9 - 53.9 %   MCV 87.7 80.0 -  100.0 fL   MCH 29.5 26.0 - 34.0 pg   MCHC 33.6 30.0 - 36.0 g/dL   RDW 86.8 88.4 - 84.4 %   Platelets 369 150 - 400 K/uL   nRBC 0.0 0.0 - 0.2 %    Comment: Performed at [facility], [address] [place], [place] [address]  CBC     Status: Abnormal   Collection Time: 02/10/24  8:07 PM  Result Value Ref Range   WBC 13.8 (H) 4.0 - 10.5 K/uL   RBC 4.91 3.87 - 5.11 MIL/uL   Hemoglobin 14.6 12.0 - 15.0 g/dL   HCT 57.5 63.9 - 53.9 %   MCV 86.4 80.0 - 100.0 fL   MCH 29.7 26.0  - 34.0 pg   MCHC 34.4 30.0 - 36.0 g/dL   RDW 86.9 88.4 - 84.4 %   Platelets 373 150 - 400 K/uL   nRBC 0.0 0.0 - 0.2 %    Comment: Performed at [facility], [address] [place], [place] [address]  CBC     Status: Abnormal   Collection Time: 02/11/24  8:23 AM  Result Value Ref Range   WBC 13.2 (H) 4.0 - 10.5 K/uL   RBC 4.92 3.87 - 5.11 MIL/uL   Hemoglobin 14.5 12.0 - 15.0 g/dL   HCT 56.9 63.9 - 53.9 %   MCV 87.4 80.0 - 100.0 fL   MCH 29.5 26.0 - 34.0 pg   MCHC 33.7 30.0 - 36.0 g/dL   RDW 87.0 88.4 - 84.4 %   Platelets 344 150 - 400 K/uL   nRBC 0.0 0.0 - 0.2 %    Comment: Performed at [facility], [address], [place], [place] [address]  CBC     Status: Abnormal   Collection Time: 02/11/24  8:37 PM  Result Value Ref Range   WBC 11.7 (H) 4.0 - 10.5 K/uL   RBC 4.30 3.87 - 5.11 MIL/uL   Hemoglobin 12.9 12.0 - 15.0 g/dL   HCT 61.7 63.9 - 53.9 %   MCV 88.8 80.0 - 100.0 fL   MCH 30.0 26.0 - 34.0 pg   MCHC 33.8 30.0 - 36.0 g/dL   RDW 86.9 88.4 - 84.4 %   Platelets 318 150 - 400 K/uL   nRBC 0.0 0.0 - 0.2 %    Comment: Performed at [facility], [address] [place], [place] [address]  CBC     Status: Abnormal   Collection Time: 02/12/24  7:54 AM  Result Value Ref Range   WBC 11.2 (H) 4.0 - 10.5 K/uL   RBC 4.47 3.87 - 5.11 MIL/uL   Hemoglobin 13.3 12.0 - 15.0 g/dL   HCT 60.4 63.9 - 53.9 %   MCV 88.4 80.0 - 100.0 fL   MCH 29.8 26.0 - 34.0 pg   MCHC 33.7 30.0 - 36.0 g/dL   RDW 87.1 88.4 - 84.4 %   Platelets 332 150 - 400 K/uL   nRBC 0.0 0.0 - 0.2 %    Comment: Performed at [facility], [address], [place], [place] [address]  Surgical pathology     Status: None   Collection Time: 02/13/24 10:16 AM  Result Value Ref Range   SURGICAL PATHOLOGY      SURGICAL PATHOLOGY CASE: [id number] PATIENT: Carol Henderson Surgical Pathology Report     Clinical History: persistent epigastric pain, abnormal ampulla, R/O H. Pylori  (cm)     FINAL MICROSCOPIC DIAGNOSIS:  A. DUODENUM, BIOPSY: - Duodenal mucosa with prominent Brunner's glands and reactive overlying epithelial changes, non-specific. - Negative for features of celiac, dysplasia, and malignancy.  B. STOMACH, ANTRUM AND BODY, BIOPSY: - Reactive and healing erosive gastritis. - Negative for H. pylori, intestinal metaplasia, dysplasia, and malignancy.  GROSS DESCRIPTION:  A: Received in formalin are tan, soft tissue fragments that are submitted in toto. Number: 4 size: Range from 0.1 to 0.2 cm blocks: 1  B: Received in formalin are tan, soft tissue fragments that are submitted in toto. Number: 4 size: Range from 0.2 to 0.5 cm blocks: 1 (KL 02/07/2024)  Final Diagnosis performed by Rexene Daily, MD.   Electronically signed 02/14/2024 Technical component p erformed at [employer], [address] [place], [place] [address].  Professional component performed at [facility]. [address] [place], [place] [address]  Immunohistochemistry Technical component (if applicable) was performed at [employer]. [address], [place], [place] [address].  IMMUNOHISTOCHEMISTRY DISCLAIMER (if applicable): Some of these immunohistochemical stains may have been developed and the performance characteristics determine by [facility]. Some may not have been cleared or approved by the U.S. Food and Drug Administration. The FDA has determined that such clearance or approval is not necessary. This test is used for clinical purposes. It should not be regarded as investigational or for research. This laboratory is certified under the Clinical Laboratory Improvement Amendments of 1988 (CLIA-88) as qualified to perform high complexity clinical laboratory testing .  The controls stained appropriately.   Resp panel by RT-PCR (RSV, Flu A&B, Covid) Anterior Nasal Swab     Status: None    Collection Time: 03/29/24  1:48 PM   Specimen: Anterior Nasal Swab  Result Value Ref Range   SARS Coronavirus 2 by RT PCR NEGATIVE NEGATIVE   Influenza A by PCR NEGATIVE NEGATIVE   Influenza B by PCR NEGATIVE NEGATIVE    Comment: (NOTE) The Xpert Xpress SARS-CoV-2/FLU/RSV plus assay is intended as an aid in the diagnosis of influenza from Nasopharyngeal swab specimens and should not be used as a sole basis for treatment. Nasal washings and aspirates are unacceptable for Xpert Xpress SARS-CoV-2/FLU/RSV testing.  Fact Sheet for Patients: bloggercourse.com  Fact Sheet for Healthcare Providers: seriousbroker.it  This test is not yet approved or cleared by the [place]  FDA and has been authorized for detection and/or diagnosis of SARS-CoV-2 by FDA under an Emergency Use Authorization (EUA). This EUA will remain in effect (meaning this test can be used) for the duration of the COVID-19 declaration under Section 564(b)(1) of the Act, 21 U.S.C. section 360bbb-3(b)(1), unless the authorization is terminated or revoked.     Resp Syncytial Virus by PCR NEGATIVE NEGATIVE    Comment: (NOTE) Fact Sheet for Patients: bloggercourse.com  Fact Sheet for Healthcare Providers: seriousbroker.it  This test is not yet approved or cleared by the [place]  FDA and has been authorized for detection and/or diagnosis of SARS-CoV-2 by FDA under an Emergency Use Authorization (EUA). This EUA will remain in effect (meaning this test can be used) for the duration of the COVID-19 declaration under Section 564(b)(1) of the Act, 21 U.S.C. section 360bbb-3(b)(1), unless the authorization is terminated or revoked.  Performed at [facility], [address] [place], [place] [address]   POC occult blood, ED     Status: None   Collection Time: 03/29/24  1:49 PM  Result Value Ref Range    Fecal Occult  Bld NEGATIVE NEGATIVE  CBC with Differential     Status: Abnormal   Collection Time: 03/29/24  1:50 PM  Result Value Ref Range   WBC 14.5 (H) 4.0 - 10.5 K/uL   RBC 5.45 (H) 3.87 - 5.11 MIL/uL   Hemoglobin 16.5 (H) 12.0 - 15.0 g/dL   HCT 50.8 (H) 63.9 - 53.9 %   MCV 90.1 80.0 - 100.0 fL   MCH 30.3 26.0 - 34.0 pg   MCHC 33.6 30.0 - 36.0 g/dL   RDW 85.4 88.4 - 84.4 %   Platelets 450 (H) 150 - 400 K/uL   nRBC 0.0 0.0 - 0.2 %   Neutrophils Relative % 64 %   Neutro Abs 9.3 (H) 1.7 - 7.7 K/uL   Lymphocytes Relative 30 %   Lymphs Abs 4.3 (H) 0.7 - 4.0 K/uL   Monocytes Relative 6 %   Monocytes Absolute 0.9 0.1 - 1.0 K/uL   Eosinophils Relative 0 %   Eosinophils Absolute 0.0 0.0 - 0.5 K/uL   Basophils Relative 0 %   Basophils Absolute 0.0 0.0 - 0.1 K/uL   Immature Granulocytes 0 %   Abs Immature Granulocytes 0.04 0.00 - 0.07 K/uL    Comment: Performed at [facility], [address] [place], [place] [address]  Ethanol     Status: None   Collection Time: 03/29/24  1:50 PM  Result Value Ref Range   Alcohol, Ethyl (B) <15 <15 mg/dL    Comment: (NOTE) For medical purposes only. Performed at [facility], [address] [place], [place] [address]   I-stat chem 8, ED (not at [facility], [facility] or [facility])     Status: Abnormal   Collection Time: 03/29/24  1:55 PM  Result Value Ref Range   Sodium 136 135 - 145 mmol/L   Potassium 5.2 (H) 3.5 - 5.1 mmol/L   Chloride 106 98 - 111 mmol/L   BUN 46 (H) 6 - 20 mg/dL   Creatinine, Ser 8.19 (H) 0.44 - 1.00 mg/dL   Glucose, Bld 877 (H) 70 - 99 mg/dL    Comment: Glucose reference range applies only to samples taken after fasting for at least 8 hours.   Calcium , Ion 1.11 (L) 1.15 - 1.40 mmol/L   TCO2 22 22 - 32 mmol/L   Hemoglobin 16.7 (H) 12.0 - 15.0 g/dL   HCT 50.9 (H) 63.9 - 53.9 %  Comprehensive metabolic panel with GFR     Status: Abnormal   Collection Time: 03/29/24  3:33 PM  Result Value Ref Range   Sodium 135 135 - 145  mmol/L   Potassium 6.5 (HH) 3.5 - 5.1 mmol/L    Comment: Critical Value, Read Back and verified with R. GONZELEZ RN @1700  03/29/24 MAB Hemolysis at this level may affect result  OKAY TO RELEASE PER RN    Chloride 104 98 - 111 mmol/L   CO2 17 (L) 22 - 32 mmol/L   Glucose, Bld 82 70 - 99 mg/dL    Comment: Glucose reference range applies only to samples taken after fasting for at least 8 hours.   BUN 38 (H) 6 - 20 mg/dL   Creatinine, Ser 8.44 (H) 0.44 - 1.00 mg/dL   Calcium  9.9 8.9 - 10.3 mg/dL   Total Protein 7.9 6.5 - 8.1 g/dL   Albumin 4.1 3.5 - 5.0 g/dL   AST 46 (H) 15 - 41 U/L    Comment: Hemolysis at this level may affect result    ALT  16 0 - 44 U/L    Comment: Hemolysis at this level may affect result    Alkaline Phosphatase 101 38 - 126 U/L   Total Bilirubin 0.4 0.0 - 1.2 mg/dL   GFR, Estimated 38 (L) >60 mL/min    Comment: (NOTE) Calculated using the CKD-EPI Creatinine Equation (2021)    Anion gap 14 5 - 15    Comment: Performed at [facility], [address] [place], [place] [address]  Lipase, blood     Status: None   Collection Time: 03/29/24  3:33 PM  Result Value Ref Range   Lipase 43 11 - 51 U/L    Comment: Hemolysis at this level may affect result  Performed at [facility], [address] [place], [place] [address]   Pro Brain natriuretic peptide     Status: Abnormal   Collection Time: 03/29/24  3:33 PM  Result Value Ref Range   Pro Brain Natriuretic Peptide >35,000.0 (H) <300.0 pg/mL    Comment: (NOTE) Age Group        Cut-Points    Interpretation  < 50 years     450 pg/mL       NT-proBNP > 450 pg/mL indicates                                ADHF is likely              50 to 75 years  900 pg/mL      NT-proBNP > 900 pg/mL indicates          ADHF is likely  > 75 years      1800 pg/mL     NT-proBNP > 1800 pg/mL indicates          ADHF is likely                           All ages    Results between       Indeterminate. Further clinical              300 and the cut-   information is needed to determine            point for age group   if ADHF is present.                                                             Elecsys proBNP II/ Elecsys proBNP II STAT           Cut-Point                       Interpretation  300 pg/mL                    NT-proBNP <300pg/mL indicates                             ADHF is not likely  Performed at [facility], [address], [place], [place] [address]   Troponin T, High Sensitivity     Status: Abnormal   Collection Time: 03/29/24  3:33 PM  Result  Value Ref Range   Troponin T High Sensitivity 118 (HH) 0 - 19 ng/L    Comment: Critical Value, Read Back and verified with R. GONZALEZ RN @1700  03/29/24 MAB Hemolysis at this level may affect result  OKAY TO RELEASE PER RN (NOTE) Biotin concentrations > 1000 ng/mL falsely decrease TnT results.  Serial cardiac troponin measurements are suggested.  Refer to the Links section for chest pain algorithms and additional  guidance. Performed at [facility], [address] [place], [place] [address]   Urinalysis, Routine w reflex microscopic -Urine, Clean Catch     Status: Abnormal   Collection Time: 03/29/24  5:30 PM  Result Value Ref Range   Color, Urine YELLOW YELLOW   APPearance HAZY (A) CLEAR   Specific Gravity, Urine 1.041 (H) 1.005 - 1.030   pH 5.0 5.0 - 8.0   Glucose, UA NEGATIVE NEGATIVE mg/dL   Hgb urine dipstick SMALL (A) NEGATIVE   Bilirubin Urine NEGATIVE NEGATIVE   Ketones, ur NEGATIVE NEGATIVE mg/dL   Protein, ur 899 (A) NEGATIVE mg/dL   Nitrite NEGATIVE NEGATIVE   Leukocytes,Ua NEGATIVE NEGATIVE   RBC / HPF 0-5 0 - 5 RBC/hpf   WBC, UA 0-5 0 - 5 WBC/hpf   Bacteria, UA NONE SEEN NONE SEEN   Squamous Epithelial / HPF 0-5 0 - 5 /HPF   Mucus PRESENT     Comment: Performed at [facility], [address] [place], [place] [address]  Urine Drug Screen     Status: Abnormal   Collection Time: 03/29/24  5:32 PM   Result Value Ref Range   Opiates NEGATIVE NEGATIVE   Cocaine NEGATIVE NEGATIVE   Benzodiazepines NEGATIVE NEGATIVE   Amphetamines NEGATIVE NEGATIVE   Tetrahydrocannabinol POSITIVE (A) NEGATIVE   Barbiturates NEGATIVE NEGATIVE   Methadone Scn, Ur NEGATIVE NEGATIVE   Fentanyl  POSITIVE (A) NEGATIVE    Comment: (NOTE) Drug screen is for Medical Purposes only. Positive results are preliminary only. If confirmation is needed, notify lab within 5 days.  Drug Class                 Cutoff (ng/mL) Amphetamine and metabolites 1000 Barbiturate and metabolites 200 Benzodiazepine              200 Opiates and metabolites     300 Cocaine and metabolites     300 THC                         50 Fentanyl                     5 Methadone                   300  Trazodone  is metabolized in vivo to several metabolites,  including pharmacologically active m-CPP, which is excreted in the  urine.  Immunoassay screens for amphetamines and MDMA have potential  cross-reactivity with these compounds and may provide false positive  result.  Performed at [facility], [address] [place], [place] [address]   Troponin T, High Sensitivity     Status: Abnormal   Collection Time: 03/29/24 10:07 PM  Result Value Ref Range   Troponin T High Sensitivity 136 (HH) 0 - 19 ng/L    Comment: Critical value noted. Value is consistent with previously reported and called value  (NOTE) Biotin concentrations > 1000 ng/mL falsely decrease TnT results.  Serial cardiac troponin measurements are suggested.  Refer to the Links section for chest pain algorithms and additional  guidance. Performed at [facility], [address], [place], [place] [address]   Magnesium     Status: Abnormal   Collection Time: 03/29/24 10:07 PM  Result Value Ref Range   Magnesium 2.7 (H) 1.7 - 2.4 mg/dL    Comment: Performed at [facility], [address], [place], [place] [address]  Phosphorus     Status: None    Collection Time: 03/29/24 10:07 PM  Result Value Ref Range   Phosphorus 3.9 2.5 - 4.6 mg/dL    Comment: Performed at [facility], [address], [place], [place] [address]  MRSA Next Gen by PCR, Nasal     Status: None   Collection Time: 03/29/24 10:57 PM   Specimen: Nasal Mucosa; Nasal Swab  Result Value Ref Range   MRSA by PCR Next Gen NOT DETECTED NOT DETECTED    Comment: (NOTE) The GeneXpert MRSA Assay (FDA approved for NASAL specimens only), is one component of a comprehensive MRSA colonization surveillance program. It is not intended to diagnose MRSA infection nor to guide or monitor treatment for MRSA infections. Test performance is not FDA approved in patients less than 25 years old. Performed at [facility], [address] [place], [place] [address]   Troponin T, High Sensitivity     Status: Abnormal   Collection Time: 03/30/24 12:10 AM  Result Value Ref Range   Troponin T High Sensitivity 135 (HH) 0 - 19 ng/L    Comment: Critical value noted. Value is consistent with previously reported and called value  (NOTE) Biotin concentrations > 1000 ng/mL falsely decrease TnT results.  Serial cardiac troponin measurements are suggested.  Refer to the Links section for chest pain algorithms and additional  guidance. Performed at [facility], [address] [place], [place] [address]   Comprehensive metabolic panel     Status: Abnormal   Collection Time: 03/30/24  2:31 AM  Result Value Ref Range   Sodium 132 (L) 135 - 145 mmol/L   Potassium 4.6 3.5 - 5.1 mmol/L    Comment: Delta check noted    Chloride 101 98 - 111 mmol/L   CO2 21 (L) 22 - 32 mmol/L   Glucose, Bld 76 70 - 99 mg/dL    Comment: Glucose reference range applies only to samples taken after fasting for at least 8 hours.   BUN 38 (H) 6 - 20 mg/dL   Creatinine, Ser 8.47 (H) 0.44 - 1.00 mg/dL   Calcium  10.0 8.9 - 10.3 mg/dL   Total Protein 7.2 6.5 - 8.1 g/dL   Albumin 3.9 3.5 - 5.0 g/dL    AST 34 15 - 41 U/L    Comment: HEMOLYSIS AT THIS LEVEL MAY AFFECT RESULT   ALT 15 0 - 44 U/L   Alkaline Phosphatase 94 38 - 126 U/L   Total Bilirubin 0.3 0.0 - 1.2 mg/dL   GFR, Estimated 39 (L) >60 mL/min    Comment: (NOTE) Calculated using the CKD-EPI Creatinine Equation (2021)    Anion gap 11 5 - 15    Comment: Performed at [facility], [address], [place], [place] [address]  CBC     Status: Abnormal   Collection Time: 03/30/24  2:31 AM  Result Value Ref Range   WBC 14.6 (H) 4.0 - 10.5 K/uL   RBC 4.85 3.87 - 5.11 MIL/uL   Hemoglobin 14.7 12.0 -  15.0 g/dL   HCT 55.1 63.9 - 53.9 %   MCV 92.4 80.0 - 100.0 fL   MCH 30.3 26.0 - 34.0 pg   MCHC 32.8 30.0 - 36.0 g/dL   RDW 85.4 88.4 - 84.4 %   Platelets 328 150 - 400 K/uL   nRBC 0.0 0.0 - 0.2 %    Comment: Performed at [facility], [address] [place], [place] [address]  Basic metabolic panel with GFR     Status: Abnormal   Collection Time: 03/31/24  2:41 AM  Result Value Ref Range   Sodium 131 (L) 135 - 145 mmol/L   Potassium 4.2 3.5 - 5.1 mmol/L   Chloride 100 98 - 111 mmol/L   CO2 24 22 - 32 mmol/L   Glucose, Bld 83 70 - 99 mg/dL    Comment: Glucose reference range applies only to samples taken after fasting for at least 8 hours.   BUN 30 (H) 6 - 20 mg/dL   Creatinine, Ser 8.62 (H) 0.44 - 1.00 mg/dL   Calcium  9.1 8.9 - 10.3 mg/dL   GFR, Estimated 45 (L) >60 mL/min    Comment: (NOTE) Calculated using the CKD-EPI Creatinine Equation (2021)    Anion gap 8 5 - 15    Comment: Performed at [facility], [address], [place], [place] [address]  CBC with Differential/Platelet     Status: None   Collection Time: 03/31/24  2:41 AM  Result Value Ref Range   WBC 8.9 4.0 - 10.5 K/uL   RBC 4.41 3.87 - 5.11 MIL/uL   Hemoglobin 13.5 12.0 - 15.0 g/dL   HCT 59.9 63.9 - 53.9 %   MCV 90.7 80.0 - 100.0 fL   MCH 30.6 26.0 - 34.0 pg   MCHC 33.8 30.0 - 36.0 g/dL   RDW 86.2 88.4 - 84.4 %   Platelets 297 150 -  400 K/uL   nRBC 0.0 0.0 - 0.2 %   Neutrophils Relative % 41 %   Neutro Abs 3.6 1.7 - 7.7 K/uL   Lymphocytes Relative 45 %   Lymphs Abs 4.0 0.7 - 4.0 K/uL   Monocytes Relative 9 %   Monocytes Absolute 0.8 0.1 - 1.0 K/uL   Eosinophils Relative 4 %   Eosinophils Absolute 0.4 0.0 - 0.5 K/uL   Basophils Relative 1 %   Basophils Absolute 0.1 0.0 - 0.1 K/uL   Immature Granulocytes 0 %   Abs Immature Granulocytes 0.02 0.00 - 0.07 K/uL    Comment: Performed at [facility], [address] [place], [place] [address]  Hemoglobin and hematocrit, blood     Status: None   Collection Time: 04/01/24  1:22 AM  Result Value Ref Range   Hemoglobin 13.0 12.0 - 15.0 g/dL   HCT 61.4 63.9 - 53.9 %    Comment: Performed at [facility], [address], [place], [place] [address]  Basic metabolic panel with GFR     Status: Abnormal   Collection Time: 04/01/24  1:22 AM  Result Value Ref Range   Sodium 134 (L) 135 - 145 mmol/L   Potassium 4.2 3.5 - 5.1 mmol/L   Chloride 102 98 - 111 mmol/L   CO2 25 22 - 32 mmol/L   Glucose, Bld 87 70 - 99 mg/dL    Comment: Glucose reference range applies only to samples taken after fasting for at least 8 hours.   BUN 27 (H) 6 - 20 mg/dL  Creatinine, Ser 1.44 (H) 0.44 - 1.00 mg/dL   Calcium  9.0 8.9 - 10.3 mg/dL   GFR, Estimated 42 (L) >60 mL/min    Comment: (NOTE) Calculated using the CKD-EPI Creatinine Equation (2021)    Anion gap 8 5 - 15    Comment: Performed at [facility], [address], [place], [place] [address]  Pro Brain natriuretic peptide     Status: Abnormal   Collection Time: 04/02/24  6:10 PM  Result Value Ref Range   Pro Brain Natriuretic Peptide 1,314.0 (H) <300.0 pg/mL    Comment: (NOTE) Age Group        Cut-Points    Interpretation  < 50 years     450 pg/mL       NT-proBNP > 450 pg/mL indicates                                ADHF is likely              50 to 75 years  900 pg/mL      NT-proBNP > 900 pg/mL indicates           ADHF is likely  > 75 years      1800 pg/mL     NT-proBNP > 1800 pg/mL indicates          ADHF is likely                           All ages    Results between       Indeterminate. Further clinical             300 and the cut-   information is needed to determine            point for age group   if ADHF is present.                                                             Elecsys proBNP II/ Elecsys proBNP II STAT           Cut-Point                       Interpretation  300 pg/mL                    NT-proBNP <300pg/mL indicates                             ADHF is not likely  Performed at [facility], [address], [place], [place] [address]   CBC with Differential/Platelet     Status: Abnormal   Collection Time: 04/03/24  6:37 AM  Result Value Ref Range   WBC 7.0 4.0 - 10.5 K/uL   RBC 4.45 3.87 - 5.11 MIL/uL   Hemoglobin 13.8 12.0 - 15.0 g/dL   HCT 59.3 63.9 - 53.9 %   MCV 91.2 80.0 - 100.0 fL   MCH 31.0 26.0 - 34.0 pg   MCHC 34.0 30.0 - 36.0 g/dL   RDW 86.1 88.4 - 84.4 %   Platelets 310 150 - 400 K/uL  nRBC 0.0 0.0 - 0.2 %   Neutrophils Relative % 46 %   Neutro Abs 3.3 1.7 - 7.7 K/uL   Lymphocytes Relative 34 %   Lymphs Abs 2.3 0.7 - 4.0 K/uL   Monocytes Relative 10 %   Monocytes Absolute 0.7 0.1 - 1.0 K/uL   Eosinophils Relative 9 %   Eosinophils Absolute 0.6 (H) 0.0 - 0.5 K/uL   Basophils Relative 1 %   Basophils Absolute 0.1 0.0 - 0.1 K/uL   Immature Granulocytes 0 %   Abs Immature Granulocytes 0.01 0.00 - 0.07 K/uL    Comment: Performed at [facility], [address] [place], [place] [address]  Comprehensive metabolic panel with GFR     Status: Abnormal   Collection Time: 04/03/24  6:37 AM  Result Value Ref Range   Sodium 139 135 - 145 mmol/L   Potassium 3.9 3.5 - 5.1 mmol/L   Chloride 105 98 - 111 mmol/L   CO2 26 22 - 32 mmol/L   Glucose, Bld 87 70 - 99 mg/dL    Comment: Glucose reference range applies only to samples taken after fasting  for at least 8 hours.   BUN 22 (H) 6 - 20 mg/dL   Creatinine, Ser 8.83 (H) 0.44 - 1.00 mg/dL   Calcium  9.6 8.9 - 10.3 mg/dL   Total Protein 6.8 6.5 - 8.1 g/dL   Albumin 3.8 3.5 - 5.0 g/dL   AST 26 15 - 41 U/L   ALT 13 0 - 44 U/L   Alkaline Phosphatase 79 38 - 126 U/L   Total Bilirubin 0.3 0.0 - 1.2 mg/dL   GFR, Estimated 54 (L) >60 mL/min    Comment: (NOTE) Calculated using the CKD-EPI Creatinine Equation (2021)    Anion gap 7 5 - 15    Comment: Performed at [facility], [address] [place], [place] [address]  Magnesium     Status: None   Collection Time: 04/03/24  6:37 AM  Result Value Ref Range   Magnesium 2.1 1.7 - 2.4 mg/dL    Comment: Performed at [facility], [address], [place], [place] [address]  Phosphorus     Status: Abnormal   Collection Time: 04/03/24  6:37 AM  Result Value Ref Range   Phosphorus 2.3 (L) 2.5 - 4.6 mg/dL    Comment: Performed at [facility], [address] [place], [place] [address]  Pro Brain natriuretic peptide     Status: Abnormal   Collection Time: 04/03/24  6:37 AM  Result Value Ref Range   Pro Brain Natriuretic Peptide 1,296.0 (H) <300.0 pg/mL    Comment: (NOTE) Age Group        Cut-Points    Interpretation  < 50 years     450 pg/mL       NT-proBNP > 450 pg/mL indicates                                ADHF is likely              50 to 75 years  900 pg/mL      NT-proBNP > 900 pg/mL indicates          ADHF is likely  > 75 years      1800 pg/mL     NT-proBNP > 1800 pg/mL indicates          ADHF is  likely                           All ages    Results between       Indeterminate. Further clinical             300 and the cut-   information is needed to determine            point for age group   if ADHF is present.                                                             Elecsys proBNP II/ Elecsys proBNP II STAT           Cut-Point                       Interpretation  300 pg/mL                    NT-proBNP  <300pg/mL indicates                             ADHF is not likely  Performed at [facility], [address], [place], [place] [address]   CBC with Differential/Platelet     Status: Abnormal   Collection Time: 04/10/24 10:27 AM  Result Value Ref Range   WBC 11.1 (H) 3.4 - 10.8 x10E3/uL   RBC 3.82 3.77 - 5.28 x10E6/uL   Hemoglobin 11.4 11.1 - 15.9 g/dL   Hematocrit 63.9 65.9 - 46.6 %   MCV 94 79 - 97 fL   MCH 29.8 26.6 - 33.0 pg   MCHC 31.7 31.5 - 35.7 g/dL   RDW 86.0 88.2 - 84.5 %   Platelets 399 150 - 450 x10E3/uL   Neutrophils 55 Not Estab. %   Lymphs 32 Not Estab. %   Monocytes 7 Not Estab. %   Eos 5 Not Estab. %   Basos 1 Not Estab. %   Neutrophils Absolute 6.1 1.4 - 7.0 x10E3/uL   Lymphocytes Absolute 3.6 (H) 0.7 - 3.1 x10E3/uL   Monocytes Absolute 0.8 0.1 - 0.9 x10E3/uL   EOS (ABSOLUTE) 0.6 (H) 0.0 - 0.4 x10E3/uL   Basophils Absolute 0.1 0.0 - 0.2 x10E3/uL   Immature Granulocytes 0 Not Estab. %   Immature Grans (Abs) 0.0 0.0 - 0.1 x10E3/uL  Iron, TIBC and Ferritin Panel     Status: None   Collection Time: 04/10/24 10:27 AM  Result Value Ref Range   Total Iron Binding Capacity 320 250 - 450 ug/dL   UIBC 732 868 - 574 ug/dL   Iron 53 27 - 840 ug/dL   Iron Saturation 17 15 - 55 %   Ferritin 81 15 - 150 ng/mL  CMP14+EGFR     Status: Abnormal   Collection Time: 04/10/24 10:27 AM  Result Value Ref Range   Glucose 95 70 - 99 mg/dL   BUN 20 6 - 24 mg/dL   Creatinine, Ser 8.97 (H) 0.57 - 1.00 mg/dL   eGFR 64 >40 fO/fpw/8.26   BUN/Creatinine Ratio 20 9 - 23   Sodium 140 134 - 144 mmol/L   Potassium 4.2 3.5 - 5.2 mmol/L  Chloride 104 96 - 106 mmol/L   CO2 21 20 - 29 mmol/L   Calcium  9.1 8.7 - 10.2 mg/dL   Total Protein 6.3 6.0 - 8.5 g/dL   Albumin 3.5 (L) 3.8 - 4.9 g/dL   Globulin, Total 2.8 1.5 - 4.5 g/dL   Bilirubin Total <9.7 0.0 - 1.2 mg/dL   Alkaline Phosphatase 78 49 - 135 IU/L   AST 20 0 - 40 IU/L   ALT 19 0 - 32 IU/L  CBG monitoring, ED     Status:  Abnormal   Collection Time: 04/11/24 10:22 AM  Result Value Ref Range   Glucose-Capillary 121 (H) 70 - 99 mg/dL    Comment: Glucose reference range applies only to samples taken after fasting for at least 8 hours.  CBC with Differential     Status: Abnormal   Collection Time: 04/11/24 11:10 AM  Result Value Ref Range   WBC 12.1 (H) 4.0 - 10.5 K/uL   RBC 4.57 3.87 - 5.11 MIL/uL   Hemoglobin 13.8 12.0 - 15.0 g/dL   HCT 57.6 63.9 - 53.9 %   MCV 92.6 80.0 - 100.0 fL   MCH 30.2 26.0 - 34.0 pg   MCHC 32.6 30.0 - 36.0 g/dL   RDW 85.9 88.4 - 84.4 %   Platelets 469 (H) 150 - 400 K/uL   nRBC 0.0 0.0 - 0.2 %   Neutrophils Relative % 67 %   Neutro Abs 8.2 (H) 1.7 - 7.7 K/uL   Lymphocytes Relative 23 %   Lymphs Abs 2.8 0.7 - 4.0 K/uL   Monocytes Relative 6 %   Monocytes Absolute 0.8 0.1 - 1.0 K/uL   Eosinophils Relative 2 %   Eosinophils Absolute 0.2 0.0 - 0.5 K/uL   Basophils Relative 1 %   Basophils Absolute 0.1 0.0 - 0.1 K/uL   Immature Granulocytes 1 %   Abs Immature Granulocytes 0.09 (H) 0.00 - 0.07 K/uL    Comment: Performed at [facility], [address], [place], [place] [address]  Basic metabolic panel     Status: Abnormal   Collection Time: 04/11/24 11:10 AM  Result Value Ref Range   Sodium 140 135 - 145 mmol/L    Comment: Electrolytes repeated to verify    Potassium 4.7 3.5 - 5.1 mmol/L   Chloride 104 98 - 111 mmol/L   CO2 22 22 - 32 mmol/L   Glucose, Bld 112 (H) 70 - 99 mg/dL    Comment: Glucose reference range applies only to samples taken after fasting for at least 8 hours.   BUN 19 6 - 20 mg/dL   Creatinine, Ser 8.99 0.44 - 1.00 mg/dL   Calcium  10.4 (H) 8.9 - 10.3 mg/dL   GFR, Estimated >39 >39 mL/min    Comment: (NOTE) Calculated using the CKD-EPI Creatinine Equation (2021)    Anion gap 15 5 - 15    Comment: Performed at [facility], [address] [place], [place] [address]  Hepatic function panel     Status: None   Collection Time:  04/11/24 11:10 AM  Result Value Ref Range   Total Protein 7.7 6.5 - 8.1 g/dL   Albumin 4.0 3.5 - 5.0 g/dL   AST 30 15 - 41 U/L    Comment: HEMOLYSIS AT THIS LEVEL MAY AFFECT RESULT   ALT 21 0 - 44 U/L   Alkaline Phosphatase 94 38 - 126 U/L   Total Bilirubin 0.3 0.0 - 1.2 mg/dL   Bilirubin, Direct <9.8  0.0 - 0.2 mg/dL    Comment: HEMOLYSIS AT THIS LEVEL MAY AFFECT RESULT   Indirect Bilirubin NOT CALCULATED 0.3 - 0.9 mg/dL    Comment: Performed at [facility], [address] [place], [place] [address]  Ethanol     Status: None   Collection Time: 04/11/24 11:10 AM  Result Value Ref Range   Alcohol, Ethyl (B) <15 <15 mg/dL    Comment: (NOTE) For medical purposes only. Performed at [facility], [address] [place], [place] [address]   Blood gas, venous     Status: Abnormal   Collection Time: 04/11/24 11:10 AM  Result Value Ref Range   pH, Ven 7.47 (H) 7.25 - 7.43   pCO2, Ven 35 (L) 44 - 60 mmHg   pO2, Ven 35 32 - 45 mmHg   Bicarbonate 25.5 20.0 - 28.0 mmol/L   Acid-Base Excess 2.1 (H) 0.0 - 2.0 mmol/L   O2 Saturation 64.4 %   Patient temperature 37.0    Collection site VEIN     Comment: Performed at [facility], [address] [place], [place] [address]  Troponin T, High Sensitivity     Status: Abnormal   Collection Time: 04/11/24 11:10 AM  Result Value Ref Range   Troponin T High Sensitivity 23 (H) 0 - 19 ng/L    Comment: (NOTE) Biotin concentrations > 1000 ng/mL falsely decrease TnT results.  Serial cardiac troponin measurements are suggested.  Refer to the Links section for chest pain algorithms and additional  guidance. Performed at [facility], [address] [place], [place] [address]   Pro Brain natriuretic peptide     Status: Abnormal   Collection Time: 04/11/24 11:10 AM  Result Value Ref Range   Pro Brain Natriuretic Peptide 1,667.0 (H) <300.0 pg/mL    Comment: (NOTE) Age Group        Cut-Points     Interpretation  < 50 years     450 pg/mL       NT-proBNP > 450 pg/mL indicates                                ADHF is likely              50 to 75 years  900 pg/mL      NT-proBNP > 900 pg/mL indicates          ADHF is likely  > 75 years      1800 pg/mL     NT-proBNP > 1800 pg/mL indicates          ADHF is likely                           All ages    Results between       Indeterminate. Further clinical             300 and the cut-   information is needed to determine            point for age group   if ADHF is present.  Elecsys proBNP II/ Elecsys proBNP II STAT           Cut-Point                       Interpretation  300 pg/mL                    NT-proBNP <300pg/mL indicates                             ADHF is not likely  Performed at [facility], [address] [place], [place] [address]   Type and screen [facility]     Status: None   Collection Time: 04/11/24 11:10 AM  Result Value Ref Range   ABO/RH(D) O POS    Antibody Screen NEG    Sample Expiration      04/14/2024,2359 Performed at [facility], [address] [place], [place] [address]   Magnesium     Status: None   Collection Time: 04/11/24 11:10 AM  Result Value Ref Range   Magnesium 2.1 1.7 - 2.4 mg/dL    Comment: Performed at [facility], [address] [place], [place] [address]  Troponin T, High Sensitivity     Status: Abnormal   Collection Time: 04/11/24 12:33 PM  Result Value Ref Range   Troponin T High Sensitivity 23 (H) 0 - 19 ng/L    Comment: (NOTE) Biotin concentrations > 1000 ng/mL falsely decrease TnT results.  Serial cardiac troponin measurements are suggested.  Refer to the Links section for chest pain algorithms and additional  guidance. Performed at [facility], [address], [place], [place] [address]       Assessment & Plan:  Following up with vascular   Schedule with GI Follow up with cardiology Blood work today MRCP order placed Continue all other medications  Problem List Items Addressed This Visit       Cardiovascular and Mediastinum   Essential hypertension   Relevant Medications   atorvastatin  (LIPITOR) 80 MG tablet   Other Relevant Orders   CMP14+EGFR (Completed)     Digestive   Rectal bleeding - Primary   Relevant Orders   CBC with Differential/Platelet (Completed)   Iron, TIBC and Ferritin Panel (Completed)   Ambulatory referral to Gastroenterology   GI bleeding   Relevant Orders   Ambulatory referral to Gastroenterology   Melanotic stools   Relevant Orders   Ambulatory referral to Gastroenterology     Other   Epigastric abdominal pain   Relevant Orders   MR ABDOMEN MRCP W WO CONTRAST    Return in about 2 weeks (around 04/24/2024) for with TJ.   Total time spent: 25 minutes. This time includes review of previous notes and results and patient face to face interaction during today's visit.    Jeoffrey Pollen, NP  04/10/2024   This document may have been prepared by Dragon Voice Recognition software and as such may include unintentional dictation errors.      [1]  Allergies Allergen Reactions   Amitriptyline  Other (See Comments)    Agitation and anger  [2]  Outpatient Medications Prior to Visit  Medication Sig   albuterol  (VENTOLIN  HFA) 108 (90 Base) MCG/ACT inhaler Inhale 2 puffs into the lungs every 6 (six) hours as needed for wheezing or shortness of breath.   amLODipine  (NORVASC ) 10 MG tablet Take 1 tablet (10 mg total) by mouth  daily.   aspirin  EC 81 MG tablet Take 1 tablet (81 mg total) by mouth daily. Swallow whole.   atorvastatin  (LIPITOR) 80 MG tablet Take 80 mg by mouth daily.   carvedilol  (COREG ) 3.125 MG tablet Take 1 tablet (3.125 mg total) by mouth 2 (two) times daily with a meal.   gabapentin  (NEURONTIN ) 400 MG capsule Take 1 capsule (400 mg total) by mouth 3 (three) times daily.  Follow with your PCP for refills   losartan  (COZAAR ) 100 MG tablet Take 1 tablet (100 mg total) by mouth daily. Follow with your PCP for repeat labs within 1 week.  Follow with your PCP for refills.   nitroGLYCERIN  (NITROSTAT ) 0.4 MG SL tablet Place 1 tablet (0.4 mg total) under the tongue every 5 (five) minutes as needed for chest pain.   ondansetron  (ZOFRAN  ODT) 4 MG disintegrating tablet Take 1 tablet (4 mg total) by mouth every 8 (eight) hours as needed for nausea or vomiting.   pantoprazole  (PROTONIX ) 40 MG tablet Take 1 tablet (40 mg total) by mouth 2 (two) times daily.   sucralfate  (CARAFATE ) 1 GM/10ML suspension Take 10 mLs (1 g total) by mouth 4 (four) times daily -  before meals and at bedtime.   Tiotropium Bromide  (SPIRIVA  RESPIMAT) 2.5 MCG/ACT AERS Inhale 2 puffs into the lungs daily.   polyethylene glycol powder (GLYCOLAX /MIRALAX ) 17 GM/SCOOP powder Dissolve as directed and take 17 g by mouth 2 (two) times daily. Titrate so you have a soft bowel movement daily (Patient not taking: Reported on 04/10/2024)   rosuvastatin  (CRESTOR ) 20 MG tablet Take 1 tablet (20 mg total) by mouth daily. (Patient not taking: Reported on 04/10/2024)   No facility-administered medications prior to visit.   "

## 2024-04-10 NOTE — ED Notes (Signed)
 Patient called multiple multiple times for triage. No answer

## 2024-04-11 ENCOUNTER — Other Ambulatory Visit: Payer: Self-pay

## 2024-04-11 ENCOUNTER — Emergency Department
Admission: EM | Admit: 2024-04-11 | Discharge: 2024-04-11 | Disposition: A | Discharge status: Home / Self Care | Source: Ambulatory Visit

## 2024-04-11 ENCOUNTER — Encounter: Payer: Self-pay | Admitting: Emergency Medicine

## 2024-04-11 ENCOUNTER — Emergency Department

## 2024-04-11 DIAGNOSIS — R195 Other fecal abnormalities: Secondary | ICD-10-CM | POA: Diagnosis not present

## 2024-04-11 DIAGNOSIS — D72829 Elevated white blood cell count, unspecified: Secondary | ICD-10-CM | POA: Insufficient documentation

## 2024-04-11 DIAGNOSIS — F17210 Nicotine dependence, cigarettes, uncomplicated: Secondary | ICD-10-CM | POA: Diagnosis not present

## 2024-04-11 DIAGNOSIS — Z79899 Other long term (current) drug therapy: Secondary | ICD-10-CM | POA: Insufficient documentation

## 2024-04-11 DIAGNOSIS — R112 Nausea with vomiting, unspecified: Secondary | ICD-10-CM | POA: Diagnosis not present

## 2024-04-11 DIAGNOSIS — K6289 Other specified diseases of anus and rectum: Secondary | ICD-10-CM | POA: Insufficient documentation

## 2024-04-11 DIAGNOSIS — R109 Unspecified abdominal pain: Secondary | ICD-10-CM | POA: Diagnosis present

## 2024-04-11 DIAGNOSIS — I10 Essential (primary) hypertension: Secondary | ICD-10-CM | POA: Diagnosis not present

## 2024-04-11 DIAGNOSIS — I2510 Atherosclerotic heart disease of native coronary artery without angina pectoris: Secondary | ICD-10-CM | POA: Diagnosis not present

## 2024-04-11 DIAGNOSIS — Z8711 Personal history of peptic ulcer disease: Secondary | ICD-10-CM | POA: Insufficient documentation

## 2024-04-11 DIAGNOSIS — R079 Chest pain, unspecified: Secondary | ICD-10-CM | POA: Insufficient documentation

## 2024-04-11 DIAGNOSIS — J449 Chronic obstructive pulmonary disease, unspecified: Secondary | ICD-10-CM | POA: Diagnosis not present

## 2024-04-11 LAB — CMP14+EGFR
ALT: 19 IU/L (ref 0–32)
AST: 20 IU/L (ref 0–40)
Albumin: 3.5 g/dL — ABNORMAL LOW (ref 3.8–4.9)
Alkaline Phosphatase: 78 IU/L (ref 49–135)
BUN/Creatinine Ratio: 20 (ref 9–23)
BUN: 20 mg/dL (ref 6–24)
Bilirubin Total: <0.2 mg/dL (ref 0.0–1.2)
CO2: 21 mmol/L (ref 20–29)
Calcium: 9.1 mg/dL (ref 8.7–10.2)
Chloride: 104 mmol/L (ref 96–106)
Creatinine, Ser: 1.02 mg/dL — ABNORMAL HIGH (ref 0.57–1.00)
Globulin, Total: 2.8 g/dL (ref 1.5–4.5)
Glucose: 95 mg/dL (ref 70–99)
Potassium: 4.2 mmol/L (ref 3.5–5.2)
Sodium: 140 mmol/L (ref 134–144)
Total Protein: 6.3 g/dL (ref 6.0–8.5)
eGFR: 64 mL/min/1.73

## 2024-04-11 LAB — IRON,TIBC AND FERRITIN PANEL
Ferritin: 81 ng/mL (ref 15–150)
Iron Saturation: 17 % (ref 15–55)
Iron: 53 ug/dL (ref 27–159)
Total Iron Binding Capacity: 320 ug/dL (ref 250–450)
UIBC: 267 ug/dL (ref 131–425)

## 2024-04-11 LAB — CBC WITH DIFFERENTIAL/PLATELET
Abs Immature Granulocytes: 0.09 K/uL — ABNORMAL HIGH (ref 0.00–0.07)
Basophils Absolute: 0.1 x10E3/uL (ref 0.0–0.2)
Basophils Absolute: 0.1 K/uL (ref 0.0–0.1)
Basophils Relative: 1 %
Basos: 1 %
EOS (ABSOLUTE): 0.6 x10E3/uL — ABNORMAL HIGH (ref 0.0–0.4)
Eos: 5 %
Eosinophils Absolute: 0.2 K/uL (ref 0.0–0.5)
Eosinophils Relative: 2 %
HCT: 42.3 % (ref 36.0–46.0)
Hematocrit: 36 % (ref 34.0–46.6)
Hemoglobin: 11.4 g/dL (ref 11.1–15.9)
Hemoglobin: 13.8 g/dL (ref 12.0–15.0)
Immature Grans (Abs): 0 x10E3/uL (ref 0.0–0.1)
Immature Granulocytes: 0 %
Immature Granulocytes: 1 %
Lymphocytes Absolute: 3.6 x10E3/uL — ABNORMAL HIGH (ref 0.7–3.1)
Lymphocytes Relative: 23 %
Lymphs Abs: 2.8 K/uL (ref 0.7–4.0)
Lymphs: 32 %
MCH: 29.8 pg (ref 26.6–33.0)
MCH: 30.2 pg (ref 26.0–34.0)
MCHC: 31.7 g/dL (ref 31.5–35.7)
MCHC: 32.6 g/dL (ref 30.0–36.0)
MCV: 94 fL (ref 79–97)
MCV: 92.6 fL (ref 80.0–100.0)
Monocytes Absolute: 0.8 x10E3/uL (ref 0.1–0.9)
Monocytes Absolute: 0.8 K/uL (ref 0.1–1.0)
Monocytes Relative: 6 %
Monocytes: 7 %
Neutro Abs: 8.2 K/uL — ABNORMAL HIGH (ref 1.7–7.7)
Neutrophils Absolute: 6.1 x10E3/uL (ref 1.4–7.0)
Neutrophils Relative %: 67 %
Neutrophils: 55 %
Platelets: 399 x10E3/uL (ref 150–450)
Platelets: 469 K/uL — ABNORMAL HIGH (ref 150–400)
RBC: 3.82 x10E6/uL (ref 3.77–5.28)
RBC: 4.57 MIL/uL (ref 3.87–5.11)
RDW: 13.9 % (ref 11.7–15.4)
RDW: 14 % (ref 11.5–15.5)
WBC: 11.1 x10E3/uL — ABNORMAL HIGH (ref 3.4–10.8)
WBC: 12.1 K/uL — ABNORMAL HIGH (ref 4.0–10.5)
nRBC: 0 % (ref 0.0–0.2)

## 2024-04-11 LAB — HEPATIC FUNCTION PANEL
ALT: 21 U/L (ref 0–44)
AST: 30 U/L (ref 15–41)
Albumin: 4 g/dL (ref 3.5–5.0)
Alkaline Phosphatase: 94 U/L (ref 38–126)
Bilirubin, Direct: <0.1 mg/dL (ref 0.0–0.2)
Total Bilirubin: 0.3 mg/dL (ref 0.0–1.2)
Total Protein: 7.7 g/dL (ref 6.5–8.1)

## 2024-04-11 LAB — BASIC METABOLIC PANEL WITH GFR
Anion gap: 15 (ref 5–15)
BUN: 19 mg/dL (ref 6–20)
CO2: 22 mmol/L (ref 22–32)
Calcium: 10.4 mg/dL — ABNORMAL HIGH (ref 8.9–10.3)
Chloride: 104 mmol/L (ref 98–111)
Creatinine, Ser: 1 mg/dL (ref 0.44–1.00)
GFR, Estimated: >60 mL/min
Glucose, Bld: 112 mg/dL — ABNORMAL HIGH (ref 70–99)
Potassium: 4.7 mmol/L (ref 3.5–5.1)
Sodium: 140 mmol/L (ref 135–145)

## 2024-04-11 LAB — TROPONIN T, HIGH SENSITIVITY
Troponin T High Sensitivity: 23 ng/L — ABNORMAL HIGH (ref 0–19)
Troponin T High Sensitivity: 23 ng/L — ABNORMAL HIGH (ref 0–19)

## 2024-04-11 LAB — BLOOD GAS, VENOUS
Acid-Base Excess: 2.1 mmol/L — ABNORMAL HIGH (ref 0.0–2.0)
Bicarbonate: 25.5 mmol/L (ref 20.0–28.0)
O2 Saturation: 64.4 %
Patient temperature: 37
pCO2, Ven: 35 mmHg — ABNORMAL LOW (ref 44–60)
pH, Ven: 7.47 — ABNORMAL HIGH (ref 7.25–7.43)
pO2, Ven: 35 mmHg (ref 32–45)

## 2024-04-11 LAB — CBG MONITORING, ED: Glucose-Capillary: 121 mg/dL — ABNORMAL HIGH (ref 70–99)

## 2024-04-11 LAB — TYPE AND SCREEN
ABO/RH(D): O POS
Antibody Screen: NEGATIVE

## 2024-04-11 LAB — ETHANOL: Alcohol, Ethyl (B): <15 mg/dL

## 2024-04-11 LAB — PRO BRAIN NATRIURETIC PEPTIDE: Pro Brain Natriuretic Peptide: 1667 pg/mL — ABNORMAL HIGH

## 2024-04-11 LAB — MAGNESIUM: Magnesium: 2.1 mg/dL (ref 1.7–2.4)

## 2024-04-11 MED ORDER — MORPHINE SULFATE (PF) 4 MG/ML IV SOLN
4.0000 mg | Freq: Once | INTRAVENOUS | Status: AC
  Administered 2024-04-11: 4 mg via INTRAVENOUS
  Filled 2024-04-11: qty 1

## 2024-04-11 MED ORDER — ONDANSETRON HCL 4 MG/2ML IJ SOLN
4.0000 mg | Freq: Once | INTRAMUSCULAR | Status: AC
  Administered 2024-04-11: 4 mg via INTRAVENOUS
  Filled 2024-04-11: qty 2

## 2024-04-11 NOTE — Discharge Instructions (Signed)
 You was seen in our emergency department for chest pain and abdominal pain.  Please continue the medications as already initiated by your [facility] team on 12/40.  Please follow-up with outpatient gastroenterology as soon as possible.  Return with any acutely worsening symptoms or any other emergency -- RETURN PRECAUTIONS & AFTERCARE: (ENGLISH) RETURN PRECAUTIONS: Return immediately to the emergency department or see/call your doctor if you feel worse, weak or have changes in speech or vision, are short of breath, have fever, vomiting, pain, bleeding or dark stool, trouble urinating or any new issues. Return here or see/call your doctor if not improving as expected for your suspected condition. FOLLOW-UP CARE: Call your doctor and/or any doctors we referred you to for more advice and to make an appointment. Do this today, tomorrow or after the weekend. Some doctors only take PPO insurance so if you have HMO insurance you may want to contact your HMO or your regular doctor for referral to a specialist within your plan. Either way tell the doctor's office that it was a referral from the emergency department so you get the soonest possible appointment.  YOUR TEST RESULTS: Take result reports of any blood or urine tests, imaging tests and EKG's to your doctor and any referral doctor. Have any abnormal tests repeated. Your doctor or a referral doctor can let you know when this should be done. Also make sure your doctor contacts this hospital to get any test results that are not currently available such as cultures or special tests for infection and final imaging reports, which are often not available at the time you leave the ER but which may list additional important findings that are not documented on the preliminary report. BLOOD PRESSURE: If your blood pressure was greater than 120/80 have your blood pressure rechecked within 1 to 2 weeks. MEDICATION SIDE EFFECTS: Do not drive, walk, bike, take the bus, etc.  if you have received or are being prescribed any sedating medications such as those for pain or anxiety or certain antihistamines like Benadryl . If you have been give one of these here get a taxi home or have a friend drive you home. Ask your pharmacist to counsel you on potential side effects of any new medication

## 2024-04-11 NOTE — ED Triage Notes (Signed)
 Pt brought over from [facility] as unresponsive. Sternal rubbed pt and pt woke up. Pt now reports chest pain and SHOB. Pt was here for a follow up cardiology appt.

## 2024-04-11 NOTE — ED Notes (Signed)
 Pt oriented x4 and d/c to lobby pt reports sister is coming to pick up pt.

## 2024-04-11 NOTE — ED Provider Notes (Signed)
 "  [facility] Provider Note    Event Date/Time   First MD Initiated Contact with Patient 04/11/24 1029     (approximate)   History   Chest Pain   HPI  Carol Henderson is a 59 y.o. female  medical history significant of CAD status post PCI, tobacco abuse, COPD, bipolar disorder, hypertension, hyperlipidemia, schizophrenia, multiple personality disorder, marijuana use and recent admission to [facility] 12/25-12/30 for chest pain and upper GI bleed who presents to the emergency department with chest pain, abdominal pain and continued dark stools and rectal pain.  Patient reports that since discharge she is still having melena and worsening rectal pain.  She reports compliance with her medications.  Today she was scheduled for follow-up with her cardiologist Dr. Florencio and staff had difficulty waking the patient up.  She was brought to our facility and sternal rubbed and woke up appropriately but then endorsed chest pain.  Patient presents with her sister and is at her baseline mental status per sister.  The patient tells me that  she should have stayed at [facility] and had chose to leave and that she needs emergent cardiac surgery and colonoscopy.   Per chart review and discharge summary copied below, patient has a history of gastric ulcerations and also rectal hemorrhoids and last saw gastroenterology on 12/28.  They cleared her for outpatient follow-up.   From 12/30 DC summary:  Carol Henderson is a 59 y.o. female with medical history significant of CAD status post PCI, tobacco abuse, COPD, bipolar disorder, hypertension, hyperlipidemia, schizophrenia, multiple personality disorder, marijuana use presented with chest pain, abdominal pain, nausea and vomiting since 3 days.   She was recently admitted on 02/07/24 with chest pain and underwent emergent cardiac cath during that admission.  No intervention, medical management recommended.    She smoke half a pack of  cigarettes per day, uses marijuana every day however denies alcohol abuse.   She's been admitted for acute on chronic abdominal pain.  She's been seen by cards who has now signed off with suspicion that her pain was GI.  CT showed celiac artery stenosis.  Vascular not recommending intervention at this time, recommending outpatient follow up.  GI suspects functional component to pain, needs outpatient GI follow up.      Physical Exam   Triage Vital Signs: ED Triage Vitals  Encounter Vitals Group     BP 04/11/24 1026 (!) 165/115     Girls Systolic BP Percentile --      Girls Diastolic BP Percentile --      Boys Systolic BP Percentile --      Boys Diastolic BP Percentile --      Pulse Rate 04/11/24 1026 100     Resp 04/11/24 1026 (!) 24     Temp --      Temp src --      SpO2 04/11/24 1026 92 %     Weight --      Height --      Head Circumference --      Peak Flow --      Pain Score 04/11/24 1024 10     Pain Loc --      Pain Education --      Exclude from Growth Chart --     Most recent vital signs: Vitals:   04/11/24 1026 04/11/24 1241  BP: (!) 165/115   Pulse: 100   Resp: (!) 24   Temp:  97.9  F (36.6 C)  SpO2: 92%     Nursing Triage Note reviewed. Vital signs reviewed and patients oxygen saturation is normoxic  General: Patient is well nourished, well developed, awake and alert, resting comfortably in no acute distress Head: Normocephalic and atraumatic Eyes: Normal inspection, extraocular muscles intact, no conjunctival pallor Ear, nose, throat: Normal external exam Neck: Normal range of motion Respiratory: Patient is in no respiratory distress, lungs CTAB Cardiovascular: Patient is borderline tachycardic, RR without murmur appreciated GI: Abd SNT with no guarding or rebound  Back: Normal inspection of the back with good strength and range of motion throughout all ext Extremities: pulses intact with good cap refills, no LE pitting edema or calf  tenderness Neuro: The patient is alert and oriented to person, place, and time, appropriately conversive, with 5/5 bilat UE/LE strength, no gross motor or sensory defects noted. Coordination appears to be adequate. Skin: Warm, dry, and intact Psych: anxious mood and affect, no SI or HI  ED Results / Procedures / Treatments   Labs (all labs ordered are listed, but only abnormal results are displayed) Labs Reviewed  CBC WITH DIFFERENTIAL/PLATELET - Abnormal; Notable for the following components:      Result Value   WBC 12.1 (*)    Platelets 469 (*)    Neutro Abs 8.2 (*)    Abs Immature Granulocytes 0.09 (*)    All other components within normal limits  BASIC METABOLIC PANEL WITH GFR - Abnormal; Notable for the following components:   Glucose, Bld 112 (*)    Calcium  10.4 (*)    All other components within normal limits  BLOOD GAS, VENOUS - Abnormal; Notable for the following components:   pH, Ven 7.47 (*)    pCO2, Ven 35 (*)    Acid-Base Excess 2.1 (*)    All other components within normal limits  PRO BRAIN NATRIURETIC PEPTIDE - Abnormal; Notable for the following components:   Pro Brain Natriuretic Peptide 1,667.0 (*)    All other components within normal limits  CBG MONITORING, ED - Abnormal; Notable for the following components:   Glucose-Capillary 121 (*)    All other components within normal limits  TROPONIN T, HIGH SENSITIVITY - Abnormal; Notable for the following components:   Troponin T High Sensitivity 23 (*)    All other components within normal limits  TROPONIN T, HIGH SENSITIVITY - Abnormal; Notable for the following components:   Troponin T High Sensitivity 23 (*)    All other components within normal limits  HEPATIC FUNCTION PANEL  ETHANOL  MAGNESIUM  URINE DRUG SCREEN  URINALYSIS, COMPLETE (UACMP) WITH MICROSCOPIC  TYPE AND SCREEN     EKG EKG and rhythm strip are interpreted by myself:   EKG: tachycardic sinus rhythm] at heart rate of 104, normal QRS  duration, RBB, QTc 512, nonspecific ST segments and T waves no ectopy EKG not consistent with Acute STEMI Rhythm strip: tachcyardic in lead II   RADIOLOGY Xray chest: No acute abnormality on my independent review interpretation radiologist agrees    PROCEDURES:  Critical Care performed: No  Procedures   MEDICATIONS ORDERED IN ED: Medications  morphine  (PF) 4 MG/ML injection 4 mg (4 mg Intravenous Given 04/11/24 1109)  ondansetron  (ZOFRAN ) injection 4 mg (4 mg Intravenous Given 04/11/24 1109)     IMPRESSION / MDM / ASSESSMENT AND PLAN / ED COURSE  Differential diagnosis includes, but is not limited to, acute ACS, GI bleed, hemorrhoid, electrolyte derangement, pneumonia, acute anemia  ED course: Patient is without any focal neurological deficits and EKG demonstrates no evidence of acute ischemia.  She had no profound acidosis on her VBG.  She was not acutely anemic and only had a very mild leukocytosis on her WBC.  She had no profound electrolyte derangements and her hepatic panel demonstrated no elevation of liver function test.  Troponin x 2 was unchanged and not significantly elevated at 23.  Chest x-ray demonstrated no pneumonia and albeit her BNP is slightly elevated, falls within the normal especially since she is satting well on room air.  I did initially offer the patient a CT scan of her abdomen given her symptoms however we reviewed the indications benefits versus alternatives of repeating this after having a recent CT scan and she voiced understanding and declined this at this time.  She feels comfortable following up with her outpatient GI specialist.  Patient was able to take p.o. and repeat abdominal exam was completely benign.  She feels comfortable returning home at this time   Clinical Course as of 04/11/24 1448  Wed Apr 11, 2024  1051 I initially ordered a CT GI bleed given patient's concerns however I reviewed the discharge summary from  1230 printed this out and reviewed this with the patient and her sister at bedside.  Patient just recently underwent a CT angio of the abdomen on 12/25 less than 2 weeks previously.  I reviewed the indications benefits risks alternatives of repeat CT imaging with the patient and her sister and asked whether the pain has changed since then.  Patient states that the pain has been constant and has not changed and she would like to decline repeat imaging at this time. [HD]  1154 Blood gas, venous(!) Not low [HD]  1222 HCT: 42.3 Not anemic [HD]  1236 DG Chest 2 View No acute abnormality [HD]  1302 I have called down to the lab once and they said that there is a problem with the analyzer [HD]  1322 Troponin T High Sensitivity(!): 23 Much lower than the other [HD]  1344 Patient feeling improved.  Taking p.o.  Awaiting second troponin and then likely home if troponin not significantly elevated [HD]  1441 Troponin T High Sensitivity(!): 23 Unchanged [HD]  1444 Patient updated on the results and feels comfortable returning home [HD]    Clinical Course User Index [HD] Nicholaus Rolland BRAVO, MD   At time of discharge there is no evidence of acute life, limb, vision, or fertility threat. Patient has stable vital signs, pain is well controlled, patient is ambulatory and p.o. tolerant.  Discharge instructions were completed using the EPIC system. I would refer you to those at this time. All warnings prescriptions follow-up etc. were discussed in detail with the patient. Patient indicates understanding and is agreeable with this plan. All questions answered.  Patient is made aware that they may return to the emergency department for any worsening or new condition or for any other emergency.   -- Risk: 5 This patient has a high risk of morbidity due to further diagnostic testing or treatment. Rationale: This patients evaluation and management involve a high risk of morbidity due to the potential severity of  presenting symptoms, need for diagnostic testing, and/or initiation of treatment that may require close monitoring. The differential includes conditions with potential for significant deterioration or requiring escalation of care. Treatment decisions in the ED,  including medication administration, procedural interventions, or disposition planning, reflect this level of risk. COPA: 5 The patient has the following acute or chronic illness/injury that poses a possible threat to life or bodily function: [X] : The patient has a potentially serious acute condition or an acute exacerbation of a chronic illness requiring urgent evaluation and management in the Emergency Department. The clinical presentation necessitates immediate consideration of life-threatening or function-threatening diagnoses, even if they are ultimately ruled out.   FINAL CLINICAL IMPRESSION(S) / ED DIAGNOSES   Final diagnoses:  Chest pain, unspecified type  Abdominal pain, unspecified abdominal location     Rx / DC Orders   ED Discharge Orders     None        Note:  This document was prepared using Dragon voice recognition software and may include unintentional dictation errors.   Nicholaus Rolland BRAVO, MD 04/11/24 1448  "

## 2024-04-16 ENCOUNTER — Ambulatory Visit: Payer: Self-pay | Admitting: Cardiology
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
## 2024-04-17 ENCOUNTER — Encounter: Admitting: Vascular Surgery

## 2024-04-24 ENCOUNTER — Ambulatory Visit: Admitting: Internal Medicine

## 2024-05-22 ENCOUNTER — Encounter: Admitting: Vascular Surgery
